# Patient Record
Sex: Male | Born: 1950 | Race: White | Hispanic: No | Marital: Married | Smoking: Current every day smoker
Health system: Southern US, Community
[De-identification: ages and names within clinical notes are randomized; demographics above are authoritative.]

## PROBLEM LIST (undated history)

## (undated) DIAGNOSIS — M545 Low back pain, unspecified: Secondary | ICD-10-CM

## (undated) DIAGNOSIS — J449 Chronic obstructive pulmonary disease, unspecified: Secondary | ICD-10-CM

## (undated) DIAGNOSIS — K449 Diaphragmatic hernia without obstruction or gangrene: Secondary | ICD-10-CM

## (undated) DIAGNOSIS — IMO0002 Reserved for concepts with insufficient information to code with codable children: Secondary | ICD-10-CM

## (undated) DIAGNOSIS — N529 Male erectile dysfunction, unspecified: Secondary | ICD-10-CM

## (undated) DIAGNOSIS — I10 Essential (primary) hypertension: Secondary | ICD-10-CM

## (undated) DIAGNOSIS — K219 Gastro-esophageal reflux disease without esophagitis: Secondary | ICD-10-CM

## (undated) DIAGNOSIS — G4733 Obstructive sleep apnea (adult) (pediatric): Secondary | ICD-10-CM

## (undated) DIAGNOSIS — K5790 Diverticulosis of intestine, part unspecified, without perforation or abscess without bleeding: Secondary | ICD-10-CM

## (undated) DIAGNOSIS — I739 Peripheral vascular disease, unspecified: Secondary | ICD-10-CM

## (undated) DIAGNOSIS — N281 Cyst of kidney, acquired: Secondary | ICD-10-CM

## (undated) DIAGNOSIS — G510 Bell's palsy: Secondary | ICD-10-CM

## (undated) DIAGNOSIS — E538 Deficiency of other specified B group vitamins: Secondary | ICD-10-CM

## (undated) DIAGNOSIS — R0602 Shortness of breath: Secondary | ICD-10-CM

## (undated) DIAGNOSIS — E7800 Pure hypercholesterolemia, unspecified: Secondary | ICD-10-CM

## (undated) DIAGNOSIS — K635 Polyp of colon: Secondary | ICD-10-CM

## (undated) DIAGNOSIS — C966 Unifocal Langerhans-cell histiocytosis: Secondary | ICD-10-CM

## (undated) DIAGNOSIS — K859 Acute pancreatitis without necrosis or infection, unspecified: Secondary | ICD-10-CM

## (undated) DIAGNOSIS — G5710 Meralgia paresthetica, unspecified lower limb: Secondary | ICD-10-CM

## (undated) DIAGNOSIS — I219 Acute myocardial infarction, unspecified: Secondary | ICD-10-CM

## (undated) DIAGNOSIS — I2510 Atherosclerotic heart disease of native coronary artery without angina pectoris: Secondary | ICD-10-CM

## (undated) HISTORY — DX: Reserved for concepts with insufficient information to code with codable children: IMO0002

## (undated) HISTORY — DX: Polyp of colon: K63.5

## (undated) HISTORY — PX: FEMORAL BYPASS: SHX50

## (undated) HISTORY — DX: Peripheral vascular disease, unspecified: I73.9

## (undated) HISTORY — DX: Deficiency of other specified B group vitamins: E53.8

## (undated) HISTORY — PX: BYPASS GRAFT: SHX909

## (undated) HISTORY — DX: Unifocal Langerhans-cell histiocytosis: C96.6

## (undated) HISTORY — DX: Chronic obstructive pulmonary disease, unspecified: J44.9

## (undated) HISTORY — PX: TUMOR REMOVAL: SHX12

## (undated) HISTORY — DX: Pure hypercholesterolemia, unspecified: E78.00

## (undated) HISTORY — DX: Diverticulosis of intestine, part unspecified, without perforation or abscess without bleeding: K57.90

## (undated) HISTORY — DX: Essential (primary) hypertension: I10

## (undated) HISTORY — DX: Meralgia paresthetica, unspecified lower limb: G57.10

## (undated) HISTORY — PX: COLONOSCOPY: SHX174

## (undated) HISTORY — DX: Gastro-esophageal reflux disease without esophagitis: K21.9

## (undated) HISTORY — DX: Low back pain, unspecified: M54.50

## (undated) HISTORY — DX: Cyst of kidney, acquired: N28.1

## (undated) HISTORY — DX: Diaphragmatic hernia without obstruction or gangrene: K44.9

## (undated) HISTORY — DX: Bell's palsy: G51.0

## (undated) HISTORY — DX: Low back pain: M54.5

## (undated) HISTORY — DX: Obstructive sleep apnea (adult) (pediatric): G47.33

## (undated) HISTORY — PX: UPPER GASTROINTESTINAL ENDOSCOPY: SHX188

## (undated) HISTORY — DX: Atherosclerotic heart disease of native coronary artery without angina pectoris: I25.10

## (undated) HISTORY — PX: INGUINAL HERNIA REPAIR: SUR1180

## (undated) HISTORY — DX: Acute pancreatitis without necrosis or infection, unspecified: K85.90

## (undated) HISTORY — DX: Acute myocardial infarction, unspecified: I21.9

## (undated) HISTORY — DX: Male erectile dysfunction, unspecified: N52.9

---

## 2004-03-09 ENCOUNTER — Emergency Department (HOSPITAL_COMMUNITY): Admission: EM | Admit: 2004-03-09 | Discharge: 2004-03-09 | Payer: Self-pay | Admitting: Emergency Medicine

## 2004-10-06 ENCOUNTER — Ambulatory Visit: Payer: Self-pay | Admitting: Internal Medicine

## 2004-10-08 ENCOUNTER — Ambulatory Visit: Payer: Self-pay

## 2004-10-11 ENCOUNTER — Ambulatory Visit: Payer: Self-pay

## 2004-11-08 ENCOUNTER — Ambulatory Visit: Payer: Self-pay | Admitting: Internal Medicine

## 2004-12-07 ENCOUNTER — Ambulatory Visit: Payer: Self-pay | Admitting: Internal Medicine

## 2005-02-04 ENCOUNTER — Ambulatory Visit: Payer: Self-pay | Admitting: Internal Medicine

## 2005-03-18 ENCOUNTER — Ambulatory Visit: Payer: Self-pay | Admitting: Gastroenterology

## 2005-03-21 ENCOUNTER — Ambulatory Visit (HOSPITAL_COMMUNITY): Admission: RE | Admit: 2005-03-21 | Discharge: 2005-03-21 | Payer: Self-pay | Admitting: Neurology

## 2005-04-01 ENCOUNTER — Encounter (INDEPENDENT_AMBULATORY_CARE_PROVIDER_SITE_OTHER): Payer: Self-pay | Admitting: *Deleted

## 2005-04-01 ENCOUNTER — Ambulatory Visit: Payer: Self-pay | Admitting: Gastroenterology

## 2005-04-01 DIAGNOSIS — K449 Diaphragmatic hernia without obstruction or gangrene: Secondary | ICD-10-CM | POA: Insufficient documentation

## 2005-04-01 DIAGNOSIS — K5730 Diverticulosis of large intestine without perforation or abscess without bleeding: Secondary | ICD-10-CM | POA: Insufficient documentation

## 2005-05-06 ENCOUNTER — Ambulatory Visit: Payer: Self-pay | Admitting: Internal Medicine

## 2005-05-06 ENCOUNTER — Encounter: Admission: RE | Admit: 2005-05-06 | Discharge: 2005-05-06 | Payer: Self-pay | Admitting: Neurology

## 2005-05-27 ENCOUNTER — Encounter: Admission: RE | Admit: 2005-05-27 | Discharge: 2005-05-27 | Payer: Self-pay | Admitting: Neurology

## 2005-06-24 ENCOUNTER — Ambulatory Visit: Payer: Self-pay | Admitting: Internal Medicine

## 2005-09-09 ENCOUNTER — Ambulatory Visit: Payer: Self-pay | Admitting: Internal Medicine

## 2005-11-18 ENCOUNTER — Ambulatory Visit: Payer: Self-pay | Admitting: Cardiology

## 2005-11-25 ENCOUNTER — Ambulatory Visit: Payer: Self-pay

## 2005-12-14 ENCOUNTER — Ambulatory Visit: Payer: Self-pay | Admitting: Internal Medicine

## 2006-03-08 ENCOUNTER — Ambulatory Visit: Payer: Self-pay | Admitting: Internal Medicine

## 2006-03-15 ENCOUNTER — Ambulatory Visit: Payer: Self-pay | Admitting: Internal Medicine

## 2006-06-15 ENCOUNTER — Ambulatory Visit: Payer: Self-pay | Admitting: Internal Medicine

## 2006-06-29 ENCOUNTER — Ambulatory Visit: Payer: Self-pay | Admitting: Internal Medicine

## 2006-07-17 ENCOUNTER — Ambulatory Visit: Payer: Self-pay | Admitting: Gastroenterology

## 2006-07-31 ENCOUNTER — Encounter (INDEPENDENT_AMBULATORY_CARE_PROVIDER_SITE_OTHER): Payer: Self-pay | Admitting: Specialist

## 2006-07-31 ENCOUNTER — Ambulatory Visit: Payer: Self-pay | Admitting: Gastroenterology

## 2006-07-31 DIAGNOSIS — K2980 Duodenitis without bleeding: Secondary | ICD-10-CM | POA: Insufficient documentation

## 2006-09-28 ENCOUNTER — Ambulatory Visit: Payer: Self-pay | Admitting: Internal Medicine

## 2006-10-03 ENCOUNTER — Ambulatory Visit: Payer: Self-pay | Admitting: Gastroenterology

## 2006-12-28 ENCOUNTER — Ambulatory Visit: Payer: Self-pay | Admitting: Internal Medicine

## 2007-06-28 ENCOUNTER — Ambulatory Visit: Payer: Self-pay | Admitting: Internal Medicine

## 2007-06-28 LAB — CONVERTED CEMR LAB
ALT: 22 units/L (ref 0–53)
AST: 21 units/L (ref 0–37)
Albumin: 4 g/dL (ref 3.5–5.2)
Alkaline Phosphatase: 80 units/L (ref 39–117)
BUN: 19 mg/dL (ref 6–23)
Bacteria, UA: NEGATIVE
Basophils Relative: 0.7 % (ref 0.0–1.0)
Bilirubin Urine: NEGATIVE
Chloride: 105 meq/L (ref 96–112)
Cholesterol: 205 mg/dL (ref 0–200)
Eosinophils Absolute: 0.1 10*3/uL (ref 0.0–0.6)
GFR calc Af Amer: 113 mL/min
HCT: 42 % (ref 39.0–52.0)
HDL: 21.4 mg/dL — ABNORMAL LOW (ref >39.0)
Hemoglobin: 15 g/dL (ref 13.0–17.0)
Leukocytes, UA: NEGATIVE
MCV: 89.9 fL (ref 78.0–100.0)
Monocytes Absolute: 0.7 10*3/uL (ref 0.2–0.7)
Monocytes Relative: 8.1 % (ref 3.0–11.0)
Neutro Abs: 4.7 10*3/uL (ref 1.4–7.7)
Neutrophils Relative %: 55.8 % (ref 43.0–77.0)
Nitrite: NEGATIVE
PSA: 0.34 ng/mL (ref 0.10–4.00)
Potassium: 4.4 meq/L (ref 3.5–5.1)
Specific Gravity, Urine: 1.02 (ref 1.000–1.03)
Total Bilirubin: 0.5 mg/dL (ref 0.3–1.2)
Total CHOL/HDL Ratio: 9.6
Triglycerides: 230 mg/dL (ref 0–149)
Urine Glucose: NEGATIVE mg/dL
Urobilinogen, UA: 0.2 (ref 0.0–1.0)
VLDL: 46 mg/dL — ABNORMAL HIGH (ref 0–40)
WBC: 8.4 10*3/uL (ref 4.5–10.5)
pH: 6 (ref 5.0–8.0)

## 2007-08-06 ENCOUNTER — Ambulatory Visit: Payer: Self-pay | Admitting: Internal Medicine

## 2007-08-06 LAB — CONVERTED CEMR LAB
ALT: 30 units/L (ref 0–53)
AST: 20 units/L (ref 0–37)
Amylase: 77 units/L (ref 27–131)
Lipase: 31 units/L (ref 11.0–59.0)
PSA: 0.37 ng/mL (ref 0.10–4.00)

## 2007-08-07 ENCOUNTER — Ambulatory Visit: Payer: Self-pay

## 2007-08-22 ENCOUNTER — Encounter: Payer: Self-pay | Admitting: Internal Medicine

## 2007-09-17 ENCOUNTER — Encounter (INDEPENDENT_AMBULATORY_CARE_PROVIDER_SITE_OTHER): Payer: Self-pay | Admitting: *Deleted

## 2007-09-17 ENCOUNTER — Ambulatory Visit: Payer: Self-pay | Admitting: Internal Medicine

## 2007-09-17 DIAGNOSIS — Z8719 Personal history of other diseases of the digestive system: Secondary | ICD-10-CM | POA: Insufficient documentation

## 2007-09-17 DIAGNOSIS — J449 Chronic obstructive pulmonary disease, unspecified: Secondary | ICD-10-CM | POA: Insufficient documentation

## 2007-09-17 DIAGNOSIS — R079 Chest pain, unspecified: Secondary | ICD-10-CM | POA: Insufficient documentation

## 2007-09-17 DIAGNOSIS — E0590 Thyrotoxicosis, unspecified without thyrotoxic crisis or storm: Secondary | ICD-10-CM | POA: Insufficient documentation

## 2007-09-17 DIAGNOSIS — J209 Acute bronchitis, unspecified: Secondary | ICD-10-CM | POA: Insufficient documentation

## 2007-09-17 DIAGNOSIS — Z860100 Personal history of colon polyps, unspecified: Secondary | ICD-10-CM | POA: Insufficient documentation

## 2007-09-17 DIAGNOSIS — C649 Malignant neoplasm of unspecified kidney, except renal pelvis: Secondary | ICD-10-CM | POA: Insufficient documentation

## 2007-09-17 DIAGNOSIS — K219 Gastro-esophageal reflux disease without esophagitis: Secondary | ICD-10-CM | POA: Insufficient documentation

## 2007-09-17 DIAGNOSIS — L723 Sebaceous cyst: Secondary | ICD-10-CM | POA: Insufficient documentation

## 2007-09-17 DIAGNOSIS — I10 Essential (primary) hypertension: Secondary | ICD-10-CM | POA: Insufficient documentation

## 2007-09-17 DIAGNOSIS — M545 Low back pain, unspecified: Secondary | ICD-10-CM | POA: Insufficient documentation

## 2007-09-17 DIAGNOSIS — I739 Peripheral vascular disease, unspecified: Secondary | ICD-10-CM | POA: Insufficient documentation

## 2007-09-17 DIAGNOSIS — J4489 Other specified chronic obstructive pulmonary disease: Secondary | ICD-10-CM | POA: Insufficient documentation

## 2007-09-17 DIAGNOSIS — F411 Generalized anxiety disorder: Secondary | ICD-10-CM | POA: Insufficient documentation

## 2007-10-01 ENCOUNTER — Encounter: Payer: Self-pay | Admitting: Internal Medicine

## 2007-10-10 ENCOUNTER — Ambulatory Visit: Payer: Self-pay | Admitting: Gastroenterology

## 2007-10-30 DIAGNOSIS — N281 Cyst of kidney, acquired: Secondary | ICD-10-CM | POA: Insufficient documentation

## 2007-10-30 DIAGNOSIS — E7800 Pure hypercholesterolemia, unspecified: Secondary | ICD-10-CM | POA: Insufficient documentation

## 2007-10-30 DIAGNOSIS — G510 Bell's palsy: Secondary | ICD-10-CM | POA: Insufficient documentation

## 2007-12-12 ENCOUNTER — Ambulatory Visit: Payer: Self-pay | Admitting: Internal Medicine

## 2007-12-13 LAB — CONVERTED CEMR LAB
CO2: 31 meq/L (ref 19–32)
Calcium: 9.2 mg/dL (ref 8.4–10.5)
Creatinine, Ser: 0.9 mg/dL (ref 0.4–1.5)
GFR calc Af Amer: 113 mL/min
Glucose, Bld: 106 mg/dL — ABNORMAL HIGH (ref 70–99)
Potassium: 4.1 meq/L (ref 3.5–5.1)
Sodium: 138 meq/L (ref 135–145)

## 2007-12-19 ENCOUNTER — Ambulatory Visit: Payer: Self-pay | Admitting: Internal Medicine

## 2007-12-19 DIAGNOSIS — F17200 Nicotine dependence, unspecified, uncomplicated: Secondary | ICD-10-CM | POA: Insufficient documentation

## 2008-01-28 ENCOUNTER — Telehealth: Payer: Self-pay | Admitting: Internal Medicine

## 2008-02-04 ENCOUNTER — Ambulatory Visit: Payer: Self-pay | Admitting: Internal Medicine

## 2008-02-05 LAB — CONVERTED CEMR LAB
Basophils Absolute: 0 10*3/uL (ref 0.0–0.1)
Basophils Relative: 0.5 % (ref 0.0–1.0)
CO2: 28 meq/L (ref 19–32)
Chloride: 99 meq/L (ref 96–112)
Glucose, Bld: 102 mg/dL — ABNORMAL HIGH (ref 70–99)
MCHC: 35.5 g/dL (ref 30.0–36.0)
MCV: 90.3 fL (ref 78.0–100.0)
Neutro Abs: 5.1 10*3/uL (ref 1.4–7.7)
Neutrophils Relative %: 52 % (ref 43.0–77.0)
Platelets: 272 10*3/uL (ref 150–400)
Potassium: 3.8 meq/L (ref 3.5–5.1)
RBC: 5.02 M/uL (ref 4.22–5.81)
RDW: 13.1 % (ref 11.5–14.6)
Sodium: 134 meq/L — ABNORMAL LOW (ref 135–145)

## 2008-02-11 ENCOUNTER — Ambulatory Visit: Payer: Self-pay | Admitting: Internal Medicine

## 2008-02-11 ENCOUNTER — Encounter: Payer: Self-pay | Admitting: Internal Medicine

## 2008-02-11 DIAGNOSIS — R930 Abnormal findings on diagnostic imaging of skull and head, not elsewhere classified: Secondary | ICD-10-CM | POA: Insufficient documentation

## 2008-02-11 DIAGNOSIS — E041 Nontoxic single thyroid nodule: Secondary | ICD-10-CM | POA: Insufficient documentation

## 2008-02-14 ENCOUNTER — Encounter (INDEPENDENT_AMBULATORY_CARE_PROVIDER_SITE_OTHER): Payer: Self-pay | Admitting: *Deleted

## 2008-02-18 ENCOUNTER — Encounter: Payer: Self-pay | Admitting: Internal Medicine

## 2008-02-28 ENCOUNTER — Ambulatory Visit: Payer: Self-pay | Admitting: Gastroenterology

## 2008-03-03 ENCOUNTER — Ambulatory Visit: Payer: Self-pay | Admitting: Endocrinology

## 2008-03-03 DIAGNOSIS — J8482 Adult pulmonary Langerhans cell histiocytosis: Secondary | ICD-10-CM | POA: Insufficient documentation

## 2008-03-10 ENCOUNTER — Ambulatory Visit: Payer: Self-pay | Admitting: Internal Medicine

## 2008-03-13 ENCOUNTER — Encounter: Admission: RE | Admit: 2008-03-13 | Discharge: 2008-03-13 | Payer: Self-pay | Admitting: Endocrinology

## 2008-04-17 ENCOUNTER — Ambulatory Visit: Payer: Self-pay | Admitting: Internal Medicine

## 2008-04-19 LAB — CONVERTED CEMR LAB
Bilirubin, Direct: 0.1 mg/dL (ref 0.0–0.3)
Chloride: 100 meq/L (ref 96–112)
Cholesterol: 163 mg/dL (ref 0–200)
Creatinine, Ser: 1 mg/dL (ref 0.4–1.5)
LDL Cholesterol: 100 mg/dL — ABNORMAL HIGH (ref 0–99)
Total Bilirubin: 0.7 mg/dL (ref 0.3–1.2)
Total CHOL/HDL Ratio: 6.9
Total Protein: 6.9 g/dL (ref 6.0–8.3)
VLDL: 40 mg/dL (ref 0–40)
Vitamin B-12: 228 pg/mL (ref 211–911)

## 2008-04-24 ENCOUNTER — Ambulatory Visit: Payer: Self-pay | Admitting: Internal Medicine

## 2008-04-24 DIAGNOSIS — E538 Deficiency of other specified B group vitamins: Secondary | ICD-10-CM | POA: Insufficient documentation

## 2008-05-05 ENCOUNTER — Ambulatory Visit: Payer: Self-pay | Admitting: Internal Medicine

## 2008-05-06 ENCOUNTER — Ambulatory Visit: Payer: Self-pay | Admitting: Cardiology

## 2008-07-22 ENCOUNTER — Ambulatory Visit: Payer: Self-pay | Admitting: Internal Medicine

## 2008-07-22 LAB — CONVERTED CEMR LAB
ALT: 23 units/L (ref 0–53)
AST: 22 units/L (ref 0–37)

## 2008-07-25 ENCOUNTER — Ambulatory Visit: Payer: Self-pay | Admitting: Internal Medicine

## 2008-08-15 ENCOUNTER — Ambulatory Visit: Payer: Self-pay

## 2008-08-20 ENCOUNTER — Ambulatory Visit: Payer: Self-pay | Admitting: Internal Medicine

## 2008-08-20 LAB — CONVERTED CEMR LAB: Creatinine, Ser: 1 mg/dL (ref 0.4–1.5)

## 2008-09-12 ENCOUNTER — Ambulatory Visit: Payer: Self-pay | Admitting: Internal Medicine

## 2008-09-12 DIAGNOSIS — R109 Unspecified abdominal pain: Secondary | ICD-10-CM | POA: Insufficient documentation

## 2008-09-15 ENCOUNTER — Ambulatory Visit: Payer: Self-pay | Admitting: Internal Medicine

## 2008-11-24 ENCOUNTER — Ambulatory Visit: Payer: Self-pay | Admitting: Internal Medicine

## 2008-11-24 LAB — CONVERTED CEMR LAB
Alkaline Phosphatase: 73 units/L (ref 39–117)
BUN: 20 mg/dL (ref 6–23)
Bilirubin, Direct: 0.1 mg/dL (ref 0.0–0.3)
Calcium: 9.4 mg/dL (ref 8.4–10.5)
Chloride: 104 meq/L (ref 96–112)
Cholesterol: 210 mg/dL (ref 0–200)
Creatinine, Ser: 0.9 mg/dL (ref 0.4–1.5)
Sodium: 138 meq/L (ref 135–145)
Total Bilirubin: 0.9 mg/dL (ref 0.3–1.2)
Total Protein: 6.8 g/dL (ref 6.0–8.3)
VLDL: 60 mg/dL — ABNORMAL HIGH (ref 0–40)

## 2008-11-27 ENCOUNTER — Ambulatory Visit: Payer: Self-pay | Admitting: Internal Medicine

## 2008-12-05 ENCOUNTER — Encounter: Payer: Self-pay | Admitting: Internal Medicine

## 2008-12-11 ENCOUNTER — Encounter: Payer: Self-pay | Admitting: Internal Medicine

## 2009-03-23 ENCOUNTER — Ambulatory Visit: Payer: Self-pay | Admitting: Internal Medicine

## 2009-03-23 LAB — CONVERTED CEMR LAB
AST: 21 units/L (ref 0–37)
BUN: 16 mg/dL (ref 6–23)
Bilirubin, Direct: 0.2 mg/dL (ref 0.0–0.3)
Calcium: 9.1 mg/dL (ref 8.4–10.5)
Creatinine, Ser: 0.9 mg/dL (ref 0.4–1.5)
Direct LDL: 147.1 mg/dL
Glucose, Bld: 98 mg/dL (ref 70–99)
Total Bilirubin: 0.8 mg/dL (ref 0.3–1.2)
Total CHOL/HDL Ratio: 8
VLDL: 45.6 mg/dL — ABNORMAL HIGH (ref 0.0–40.0)

## 2009-03-26 ENCOUNTER — Ambulatory Visit: Payer: Self-pay | Admitting: Internal Medicine

## 2009-03-26 DIAGNOSIS — R198 Other specified symptoms and signs involving the digestive system and abdomen: Secondary | ICD-10-CM | POA: Insufficient documentation

## 2009-04-28 ENCOUNTER — Ambulatory Visit: Payer: Self-pay | Admitting: Gastroenterology

## 2009-05-19 ENCOUNTER — Ambulatory Visit: Payer: Self-pay | Admitting: Gastroenterology

## 2009-05-20 ENCOUNTER — Telehealth: Payer: Self-pay | Admitting: Gastroenterology

## 2009-05-29 ENCOUNTER — Telehealth: Payer: Self-pay | Admitting: Internal Medicine

## 2009-06-02 ENCOUNTER — Ambulatory Visit: Payer: Self-pay | Admitting: Gastroenterology

## 2009-06-11 ENCOUNTER — Encounter (INDEPENDENT_AMBULATORY_CARE_PROVIDER_SITE_OTHER): Payer: Self-pay | Admitting: *Deleted

## 2009-06-11 ENCOUNTER — Ambulatory Visit: Payer: Self-pay | Admitting: Gastroenterology

## 2009-06-12 ENCOUNTER — Encounter: Payer: Self-pay | Admitting: Gastroenterology

## 2009-07-13 ENCOUNTER — Ambulatory Visit: Payer: Self-pay | Admitting: Gastroenterology

## 2009-07-13 LAB — CONVERTED CEMR LAB
OCCULT 3: NEGATIVE
OCCULT 4: NEGATIVE

## 2009-07-14 ENCOUNTER — Encounter: Payer: Self-pay | Admitting: Gastroenterology

## 2009-07-22 ENCOUNTER — Ambulatory Visit: Payer: Self-pay | Admitting: Internal Medicine

## 2009-07-23 LAB — CONVERTED CEMR LAB
ALT: 22 units/L (ref 0–53)
AST: 21 units/L (ref 0–37)
Alkaline Phosphatase: 71 units/L (ref 39–117)
BUN: 21 mg/dL (ref 6–23)
Bilirubin Urine: NEGATIVE
Calcium: 9.6 mg/dL (ref 8.4–10.5)
Creatinine, Ser: 1.1 mg/dL (ref 0.4–1.5)
Direct LDL: 125.3 mg/dL
Glucose, Bld: 107 mg/dL — ABNORMAL HIGH (ref 70–99)
Hgb A1c MFr Bld: 5.7 % (ref 4.6–6.5)
Leukocytes, UA: NEGATIVE
Nitrite: NEGATIVE
Potassium: 4.9 meq/L (ref 3.5–5.1)
Specific Gravity, Urine: 1.025 (ref 1.000–1.030)
TSH: 1.14 microintl units/mL (ref 0.35–5.50)
Total CHOL/HDL Ratio: 9
Triglycerides: 287 mg/dL — ABNORMAL HIGH (ref 0.0–149.0)
VLDL: 57.4 mg/dL — ABNORMAL HIGH (ref 0.0–40.0)

## 2009-07-29 ENCOUNTER — Ambulatory Visit: Payer: Self-pay | Admitting: Internal Medicine

## 2009-07-29 DIAGNOSIS — M6740 Ganglion, unspecified site: Secondary | ICD-10-CM | POA: Insufficient documentation

## 2009-08-05 ENCOUNTER — Encounter: Payer: Self-pay | Admitting: Internal Medicine

## 2009-08-07 ENCOUNTER — Encounter: Admission: RE | Admit: 2009-08-07 | Discharge: 2009-08-07 | Payer: Self-pay | Admitting: Internal Medicine

## 2009-08-11 ENCOUNTER — Ambulatory Visit: Payer: Self-pay

## 2009-08-11 ENCOUNTER — Encounter: Payer: Self-pay | Admitting: Internal Medicine

## 2009-08-12 ENCOUNTER — Encounter: Payer: Self-pay | Admitting: Internal Medicine

## 2009-08-12 DIAGNOSIS — I6529 Occlusion and stenosis of unspecified carotid artery: Secondary | ICD-10-CM | POA: Insufficient documentation

## 2009-08-28 ENCOUNTER — Ambulatory Visit: Payer: Self-pay | Admitting: Internal Medicine

## 2009-08-28 DIAGNOSIS — I2510 Atherosclerotic heart disease of native coronary artery without angina pectoris: Secondary | ICD-10-CM | POA: Insufficient documentation

## 2009-09-02 ENCOUNTER — Encounter: Payer: Self-pay | Admitting: Internal Medicine

## 2009-11-23 ENCOUNTER — Ambulatory Visit: Payer: Self-pay | Admitting: Internal Medicine

## 2009-11-24 LAB — CONVERTED CEMR LAB
AST: 19 units/L (ref 0–37)
Albumin: 4.2 g/dL (ref 3.5–5.2)
Alkaline Phosphatase: 77 units/L (ref 39–117)
CO2: 29 meq/L (ref 19–32)
Calcium: 9.6 mg/dL (ref 8.4–10.5)
GFR calc non Af Amer: 81.71 mL/min (ref >60)
Potassium: 4.2 meq/L (ref 3.5–5.1)
Total Bilirubin: 1 mg/dL (ref 0.3–1.2)
Total Protein: 7.1 g/dL (ref 6.0–8.3)
Vitamin B-12: 252 pg/mL (ref 211–911)

## 2009-11-26 ENCOUNTER — Ambulatory Visit: Payer: Self-pay | Admitting: Internal Medicine

## 2010-01-18 ENCOUNTER — Ambulatory Visit: Payer: Self-pay | Admitting: Internal Medicine

## 2010-05-25 ENCOUNTER — Ambulatory Visit: Payer: Self-pay | Admitting: Internal Medicine

## 2010-05-25 LAB — CONVERTED CEMR LAB
ALT: 29 units/L (ref 0–53)
Albumin: 4 g/dL (ref 3.5–5.2)
Alkaline Phosphatase: 82 units/L (ref 39–117)
Basophils Relative: 1 % (ref 0.0–3.0)
Bilirubin, Direct: 0.1 mg/dL (ref 0.0–0.3)
CO2: 29 meq/L (ref 19–32)
Calcium: 9.5 mg/dL (ref 8.4–10.5)
Direct LDL: 135.9 mg/dL
Eosinophils Absolute: 0.2 10*3/uL (ref 0.0–0.7)
Eosinophils Relative: 2 % (ref 0.0–5.0)
Glucose, Bld: 92 mg/dL (ref 70–99)
Lymphocytes Relative: 29 % (ref 12.0–46.0)
Monocytes Absolute: 0.9 10*3/uL (ref 0.1–1.0)
Monocytes Relative: 9.2 % (ref 3.0–12.0)
Neutro Abs: 5.6 10*3/uL (ref 1.4–7.7)
Total Bilirubin: 0.7 mg/dL (ref 0.3–1.2)
Total Protein: 7.1 g/dL (ref 6.0–8.3)
VLDL: 48 mg/dL — ABNORMAL HIGH (ref 0.0–40.0)
WBC: 9.6 10*3/uL (ref 4.5–10.5)

## 2010-06-01 ENCOUNTER — Ambulatory Visit: Payer: Self-pay | Admitting: Internal Medicine

## 2010-06-01 DIAGNOSIS — R209 Unspecified disturbances of skin sensation: Secondary | ICD-10-CM | POA: Insufficient documentation

## 2010-06-01 DIAGNOSIS — N529 Male erectile dysfunction, unspecified: Secondary | ICD-10-CM | POA: Insufficient documentation

## 2010-09-16 ENCOUNTER — Encounter: Payer: Self-pay | Admitting: Internal Medicine

## 2010-09-17 ENCOUNTER — Ambulatory Visit: Payer: Self-pay | Admitting: Internal Medicine

## 2010-09-17 ENCOUNTER — Ambulatory Visit: Payer: Self-pay

## 2010-10-06 ENCOUNTER — Ambulatory Visit: Payer: Self-pay | Admitting: Internal Medicine

## 2010-10-13 ENCOUNTER — Ambulatory Visit: Payer: Self-pay | Admitting: Internal Medicine

## 2010-11-02 ENCOUNTER — Telehealth: Payer: Self-pay | Admitting: Internal Medicine

## 2010-11-03 ENCOUNTER — Other Ambulatory Visit: Payer: Self-pay | Admitting: Internal Medicine

## 2010-11-03 LAB — URINALYSIS
Bilirubin Urine: NEGATIVE
Hemoglobin, Urine: NEGATIVE
Ketones, ur: NEGATIVE
Leukocytes, UA: NEGATIVE
Nitrite: NEGATIVE
Specific Gravity, Urine: 1.015 (ref 1.000–1.030)
Total Protein, Urine: NEGATIVE
Urine Glucose: NEGATIVE
Urobilinogen, UA: 0.2 (ref 0.0–1.0)
pH: 5.5 (ref 5.0–8.0)

## 2010-11-03 LAB — BASIC METABOLIC PANEL
BUN: 17 mg/dL (ref 6–23)
CO2: 27 mEq/L (ref 19–32)
Calcium: 9.5 mg/dL (ref 8.4–10.5)
Chloride: 103 mEq/L (ref 96–112)
Creatinine, Ser: 0.9 mg/dL (ref 0.4–1.5)
GFR: 87.47 mL/min (ref >60.00)
Glucose, Bld: 83 mg/dL (ref 70–99)
Potassium: 4.1 mEq/L (ref 3.5–5.1)
Sodium: 136 mEq/L (ref 135–145)

## 2010-11-03 LAB — CBC WITH DIFFERENTIAL/PLATELET
Basophils Absolute: 0.1 10*3/uL (ref 0.0–0.1)
Basophils Relative: 0.6 % (ref 0.0–3.0)
Eosinophils Absolute: 0.2 10*3/uL (ref 0.0–0.7)
Eosinophils Relative: 2.1 % (ref 0.0–5.0)
HCT: 50.6 % (ref 39.0–52.0)
Hemoglobin: 17.6 g/dL — ABNORMAL HIGH (ref 13.0–17.0)
Lymphocytes Relative: 29.8 % (ref 12.0–46.0)
Lymphs Abs: 2.9 10*3/uL (ref 0.7–4.0)
MCHC: 34.8 g/dL (ref 30.0–36.0)
MCV: 92.2 fl (ref 78.0–100.0)
Monocytes Absolute: 1 10*3/uL (ref 0.1–1.0)
Monocytes Relative: 10.4 % (ref 3.0–12.0)
Neutro Abs: 5.6 10*3/uL (ref 1.4–7.7)
Neutrophils Relative %: 57.1 % (ref 43.0–77.0)
Platelets: 238 10*3/uL (ref 150.0–400.0)
RBC: 5.49 Mil/uL (ref 4.22–5.81)
RDW: 13.7 % (ref 11.5–14.6)
WBC: 9.9 10*3/uL (ref 4.5–10.5)

## 2010-11-03 LAB — TSH: TSH: 1.82 u[IU]/mL (ref 0.35–5.50)

## 2010-11-03 LAB — HEPATIC FUNCTION PANEL
ALT: 21 U/L (ref 0–53)
AST: 22 U/L (ref 0–37)
Albumin: 3.7 g/dL (ref 3.5–5.2)
Alkaline Phosphatase: 78 U/L (ref 39–117)
Bilirubin, Direct: 0.1 mg/dL (ref 0.0–0.3)
Total Bilirubin: 0.9 mg/dL (ref 0.3–1.2)
Total Protein: 7.1 g/dL (ref 6.0–8.3)

## 2010-11-03 LAB — LIPID PANEL
Cholesterol: 207 mg/dL — ABNORMAL HIGH (ref 0–200)
HDL: 21.8 mg/dL — ABNORMAL LOW (ref >39.00)
Total CHOL/HDL Ratio: 9
Triglycerides: 311 mg/dL — ABNORMAL HIGH (ref 0.0–149.0)
VLDL: 62.2 mg/dL — ABNORMAL HIGH (ref 0.0–40.0)

## 2010-11-03 LAB — PSA: PSA: 0.48 ng/mL (ref 0.10–4.00)

## 2010-11-03 LAB — LDL CHOLESTEROL, DIRECT: Direct LDL: 135.3 mg/dL

## 2010-11-05 LAB — TESTOSTERONE: Testosterone: 320.2 ng/dL — ABNORMAL LOW (ref 350.00–890.00)

## 2010-11-10 DIAGNOSIS — D751 Secondary polycythemia: Secondary | ICD-10-CM | POA: Insufficient documentation

## 2010-11-10 DIAGNOSIS — R5381 Other malaise: Secondary | ICD-10-CM | POA: Insufficient documentation

## 2010-11-10 DIAGNOSIS — K602 Anal fissure, unspecified: Secondary | ICD-10-CM | POA: Insufficient documentation

## 2010-11-10 DIAGNOSIS — E291 Testicular hypofunction: Secondary | ICD-10-CM | POA: Insufficient documentation

## 2010-11-10 DIAGNOSIS — R5383 Other fatigue: Secondary | ICD-10-CM

## 2010-11-10 DIAGNOSIS — G4733 Obstructive sleep apnea (adult) (pediatric): Secondary | ICD-10-CM | POA: Insufficient documentation

## 2010-11-21 ENCOUNTER — Encounter: Payer: Self-pay | Admitting: Internal Medicine

## 2010-11-21 ENCOUNTER — Encounter: Payer: Self-pay | Admitting: Neurology

## 2010-11-30 NOTE — Assessment & Plan Note (Signed)
Summary: 4 MO ROV /NWS  #   Vital Signs:  Patient profile:   60 year old male Height:      70 inches Weight:      253 pounds BMI:     36.43 O2 Sat:      94 % on Room air Temp:     99.0 degrees F oral Pulse rate:   95 / minute Pulse rhythm:   regular Resp:     16 per minute BP sitting:   128 / 80  (left arm) Cuff size:   large  Vitals Entered By: Lanier Prude, CMA(AAMA) (June 01, 2010 8:07 AM)  O2 Flow:  Room air CC: 4 mo f/u  c/o rt leg numbness Is Patient Diabetic? No   Primary Care Provider:  Sonda Primes, MD  CC:  4 mo f/u  c/o rt leg numbness.  History of Present Illness: The patient presents for a follow up of hypertension, diabetes, hyperlipidemia, COPD C/o R thigh pain and numbness - it hurts a lot C/o ED - Levitra is not working   Current Medications (verified): 1)  Benazepril-Hydrochlorothiazide 20-25 Mg  Tabs (Benazepril-Hydrochlorothiazide) .Marland Kitchen.. 1 Qam 2)  Aspir-Low 81 Mg Tbec (Aspirin) .Marland Kitchen.. 1 Once Daily Pc 3)  Meloxicam 15 Mg Tabs (Meloxicam) .... One By Mouth Daily Pc As Needed Pain 4)  Vitamin D3 1000 Unit  Tabs (Cholecalciferol) .Marland Kitchen.. 1 Qd 5)  Vitamin B-12 Cr 1000 Mcg  Tbcr (Cyanocobalamin) .... Take One Tablet By Mouth Daily 6)  Prevacid 30 Mg Cpdr (Lansoprazole) .... Once A Day  Otc 7)  Triamcinolone 0.5% Cream in Eucerin Lotion 1:10 .... Use Two Times A Day As Needed Dry Skin 8)  Levitra 20 Mg Tabs (Vardenafil Hcl) .... 1/2-1 Once Daily As Needed 9)  Tramadol Hcl 50 Mg Tabs (Tramadol Hcl) .Marland Kitchen.. 1-2 Tabs By Mouth Two Times A Day As Needed Pain  Allergies (verified): 1)  Zocor 2)  Crestor 3)  Pravastatin Sodium (Pravastatin Sodium)  Past History:  Past Surgical History: Last updated: 09/12/2008 Rt. iliofemoral bypass  Rt. 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, chartered Divorced Former Smoker 2010 restarted 2011 Alcohol Use - yes Daily Caffeine Use 3 or4 cups daily  Past  Medical History: Current Problems:  HIATAL HERNIA (ICD-553.3) DUODENITIS, WITHOUT HEMORRHAGE (ICD-535.60) RENAL CYST (ICD-593.2) BELLS PALSY (ICD-351.0) HYPERCHOLESTEROLEMIA (ICD-272.0) DIVERTICULOSIS, COLON (ICD-562.10) SEBACCEOUS CYST-INFECTED (ICD-706.2) CARCINOMA, RENAL CELL (ICD-189.0) BRONCHITIS, ACUTE (ICD-466.0) LOW BACK PAIN (ICD-724.2) HYPERTENSION (ICD-401.9) HYPERTHYROIDISM (ICD-242.90) Vit B12 def 2009 PERIPHERAL VASCULAR DISEASE (ICD-443.9) PANCREATITIS, HX OF (ICD-V12.70) GERD (ICD-530.81) COPD (ICD-496) COLONIC POLYPS, HX OF (ICD-V12.72) ANXIETY (ICD-300.00) CHEST PAIN, UNSPECIFIED (ICD-786.50) ? 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  The patient denies fever, chest pain, prolonged cough, abdominal pain, melena, and weight gain.    Physical Exam  General:  overweight-appearing.   Eyes:  No corneal or conjunctival inflammation noted. EOMI. Perrla. Ears:  External ear exam shows no significant lesions or deformities.  Otoscopic examination reveals clear canals, tympanic membranes are intact bilaterally without bulging, retraction, inflammation or discharge. Hearing is grossly normal bilaterally. Nose:  External nasal examination shows no deformity or inflammation. Nasal mucosa are pink and moist without lesions  or exudates. Mouth:  Oral mucosa and oropharynx without lesions or exudates.  Teeth in good repair. Neck:  No deformities, masses, or tenderness noted. Lungs:  Normal respiratory effort, chest expands symmetrically. Lungs are clear to auscultation, no crackles or wheezes. Heart:  Normal rate and regular rhythm. S1 and S2 normal without gallop, click, rub or other extra sounds. 1/6 systolic heart murmur    Abdomen:  Bowel sounds positive,abdomen soft and non-tender without masses, organomegaly or hernias noted. Msk:  Lumbar-sacral spine is tender to palpation over paraspinal muscles and painfull with the ROM  Pulses:  R and L carotid,radial,femoral,dorsalis pedis and posterior tibial pulses are full and equal bilaterally Extremities:  B no edema Neurologic:  numb oval large area on R thigh Skin:  Intact without suspicious lesions or rashes Psych:  Cognition and judgment appear intact. Alert and cooperative with normal attention span and concentration. No apparent delusions, illusions, hallucinations   Impression & Recommendations:  Problem # 1:  PARESTHESIA (ICD-782.0) R thigh M paresthetica Assessment New Loosen up the belt  Problem # 2:  ERECTILE DYSFUNCTION, ORGANIC (ICD-607.84) Assessment: Deteriorated  His updated medication list for this problem includes:    Levitra 20 Mg Tabs (Vardenafil hcl) .Marland Kitchen... 1/2-1 once daily as needed    Cialis 20 Mg Tabs (Tadalafil) .Marland Kitchen... 1/2 or 1 by mouth q 1-3 d prn  Problem # 3:  B12 DEFICIENCY (ICD-266.2) Assessment: Unchanged On the regimen of medicine(s) reflected in the chart    Problem # 4:  LOW BACK PAIN (ICD-724.2) Assessment: Unchanged  His updated medication list for this problem includes:    Aspir-low 81 Mg Tbec (Aspirin) .Marland Kitchen... 1 once daily pc    Meloxicam 15 Mg Tabs (Meloxicam) ..... One by mouth daily pc as needed pain    Tramadol Hcl 50 Mg Tabs (Tramadol hcl) .Marland Kitchen... 1-2 tabs by mouth two times a day as needed pain  Problem # 5:  PERIPHERAL VASCULAR DISEASE (ICD-443.9) Assessment: Unchanged  Problem # 6:  TOBACCO USE DISORDER/SMOKER-SMOKING CESSATION DISCUSSED (ICD-305.1) Assessment: Deteriorated He has restarted smoking Encouraged smoking cessation and discussed different methods for smoking cessation.   Complete Medication List: 1)  Aspir-low 81 Mg Tbec (Aspirin) .Marland Kitchen.. 1 once daily pc 2)  Meloxicam 15 Mg Tabs (Meloxicam) ....  One by mouth daily pc as needed pain 3)  Vitamin D3 1000 Unit Tabs (Cholecalciferol) .Marland Kitchen.. 1 qd 4)  Vitamin B-12 Cr 1000 Mcg Tbcr (Cyanocobalamin) .... Take one tablet by mouth daily 5)  Prevacid 30 Mg Cpdr (Lansoprazole) .... Once a day  otc 6)  Triamcinolone 0.5% Cream in Eucerin Lotion 1:10  .... Use two times a day as needed dry skin 7)  Levitra 20 Mg Tabs (Vardenafil hcl) .... 1/2-1 once daily as needed 8)  Tramadol Hcl 50 Mg Tabs (Tramadol hcl) .Marland Kitchen.. 1-2 tabs by mouth two times a day as needed pain 9)  Losartan Potassium 100 Mg Tabs (Losartan potassium) .Marland Kitchen.. 1 by mouth once daily for blood pressure 10)  Cialis 20 Mg Tabs (Tadalafil) .... 1/2 or 1 by mouth q 1-3 d prn  Patient Instructions: 1)  Please schedule a follow-up appointment in 3 months. 2)  BMP prior to visit, ICD-9: 3)  Hepatic Panel prior to visit, ICD-9:995.20 272.0 4)  Lipid Panel prior to visit, ICD-9: 5)  TSH prior to visit, ICD-9: Prescriptions: CIALIS 20 MG TABS (TADALAFIL) 1/2 or 1 by mouth q 1-3 d prn  #6 x 12   Entered and Authorized by:   Macarthur Critchley  Buckner Malta MD   Signed by:   Tresa Garter MD on 06/01/2010   Method used:   Print then Give to Patient   RxID:   [id number] JYNWGNFA POTASSIUM 100 MG TABS (LOSARTAN POTASSIUM) 1 by mouth once daily for blood pressure  #30 x 12   Entered and Authorized by:   Tresa Garter MD   Signed by:   Tresa Garter MD on 06/01/2010   Method used:   Electronically to        [facility] [phone]* (retail)       [address]       [place], [place]  [address]       Ph: [phone]       Fax: [phone]   RxID:   [id number]

## 2010-11-30 NOTE — Assessment & Plan Note (Signed)
Summary: 4 MO ROV /NWS #   Vital Signs:  Patient profile:   60 year old male Weight:      259 pounds Temp:     99.4 degrees F oral Pulse rate:   99 / minute BP sitting:   124 / 86  (left arm)  Vitals Entered By: Tora Perches (November 26, 2009 8:15 AM) CC: f/u Is Patient Diabetic? No   Primary Care Provider:  Sonda Primes, MD  CC:  f/u.  History of Present Illness: The patient presents for a follow up of hypertension, B12 def, OA, hyperlipidemia C/o numbness.  Preventive Screening-Counseling & Management  Alcohol-Tobacco     Smoking Status: quit  Allergies: 1)  Zocor 2)  Crestor 3)  Pravastatin Sodium (Pravastatin Sodium)  Past History:  Past Medical History: Last updated: 08/28/2009 Current Problems:  HIATAL HERNIA (ICD-553.3) DUODENITIS, WITHOUT HEMORRHAGE (ICD-535.60) RENAL CYST (ICD-593.2) BELLS PALSY (ICD-351.0) HYPERCHOLESTEROLEMIA (ICD-272.0) DIVERTICULOSIS, COLON (ICD-562.10) SEBACCEOUS CYST-INFECTED (ICD-706.2) CARCINOMA, RENAL CELL (ICD-189.0) BRONCHITIS, ACUTE (ICD-466.0) LOW BACK PAIN (ICD-724.2) HYPERTENSION (ICD-401.9) HYPERTHYROIDISM (ICD-242.90) Vit B12 def 2009 PERIPHERAL VASCULAR DISEASE (ICD-443.9) PANCREATITIS, HX OF (ICD-V12.70) GERD (ICD-530.81) COPD (ICD-496) COLONIC POLYPS, HX OF (ICD-V12.72) ANXIETY (ICD-300.00) CHEST PAIN, UNSPECIFIED (ICD-786.50) ? MSK Borderline  low Vit B12 CAD  Past Surgical History: Last updated: 09/12/2008 Rt. iliofemoral bypass  Rt. 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   Mouth:  Oral mucosa and oropharynx without lesions or exudates.  Teeth in good repair. Lungs:  Normal respiratory effort, chest expands symmetrically. Lungs are clear to auscultation, no crackles or wheezes. Heart:   Normal rate and regular rhythm. S1 and S2 normal without gallop, murmur, click, rub or other extra sounds. Abdomen:  Bowel sounds positive,abdomen soft and non-tender without masses, organomegaly or hernias noted. Msk:  Lumbar-sacral spine is tender to palpation over paraspinal muscles and painfull with the ROM  Neurologic:  No cranial nerve deficits noted. Station and gait are normal. Plantar reflexes are down-going bilaterally. DTRs are symmetrical throughout. Sensory, motor and coordinative functions appear intact. Skin:  Intact without suspicious lesions or rashes Psych:  Cognition and judgment appear intact. Alert and cooperative with normal attention span and concentration. No apparent delusions, illusions, hallucinations   Impression & Recommendations:  Problem # 1:  B12 DEFICIENCY (ICD-266.2) Assessment Deteriorated  Risks of noncompliance with treatment discussed. Compliance encouraged. Ran out of Rx  Orders: Vit B12 1000 mcg (J3420) Admin of Therapeutic Inj  intramuscular or subcutaneous (16109)  Problem # 2:  CAD, NATIVE VESSEL (ICD-414.01) Assessment: Comment Only  His updated medication list for this problem includes:    Benazepril-hydrochlorothiazide 20-25 Mg Tabs (Benazepril-hydrochlorothiazide) .Marland Kitchen... 1 qam    Aspir-low 81 Mg Tbec (Aspirin) .Marland Kitchen... 1 once daily pc  Problem # 3:  HYPERCHOLESTEROLEMIA (ICD-272.0) Assessment: Unchanged  Labs Reviewed: SGOT: 19 (11/23/2009)   SGPT: 20 (11/23/2009)   HDL:23.60 (07/22/2009), 24.50 (03/23/2009)  LDL:DEL (11/24/2008), 100 (04/17/2008)  Chol:203 (07/22/2009), 207 (03/23/2009)  Trig:287.0 (07/22/2009), 228.0 (03/23/2009)  Problem # 4:  HYPERTENSION (ICD-401.9) Assessment: Improved  His updated medication list for this problem includes:    Benazepril-hydrochlorothiazide 20-25 Mg Tabs (Benazepril-hydrochlorothiazide) .Marland Kitchen... 1 qam  Problem # 5:  COPD (ICD-496) Assessment: Unchanged  Complete Medication List: 1)   Benazepril-hydrochlorothiazide 20-25 Mg Tabs (Benazepril-hydrochlorothiazide) .Marland Kitchen.. 1 qam 2)  Aspir-low 81 Mg Tbec (Aspirin) .Marland KitchenMarland KitchenMarland Kitchen 1  once daily pc 3)  Meloxicam 15 Mg Tabs (Meloxicam) .... One by mouth daily pc as needed pain 4)  Vitamin D3 1000 Unit Tabs (Cholecalciferol) .Marland Kitchen.. 1 qd 5)  Vitamin B-12 Cr 1000 Mcg Tbcr (Cyanocobalamin) .... Take one tablet by mouth daily 6)  Prevacid 30 Mg Cpdr (Lansoprazole) .... Once a day  otc 7)  Triamcinolone 0.5% Cream in Eucerin Lotion 1:10  .... Use two times a day as needed dry skin 8)  Levitra 20 Mg Tabs (Vardenafil hcl) .... 1/2-1 once daily as needed 9)  Tramadol Hcl 50 Mg Tabs (Tramadol hcl) .Marland Kitchen.. 1-2 tabs by mouth two times a day as needed pain  Patient Instructions: 1)  Please schedule a follow-up appointment in 4 months. 2)  BMP prior to visit, ICD-9: 3)  Hepatic Panel prior to visit, ICD-9:272.0 4)  B12 5)  CBC w/ Diff prior to visit, ICD-9: 6)  Use stretching exercises that I have provided (15 min. or longer every day) Prescriptions: TRAMADOL HCL 50 MG TABS (TRAMADOL HCL) 1-2 tabs by mouth two times a day as needed pain  #120 x 3   Entered and Authorized by:   Tresa Garter MD   Signed by:   Tresa Garter MD on 11/26/2009   Method used:   Electronically to        [facility] [phone]* (retail)       [address]       [place], [place]  [address]       Ph: [phone]       Fax: [phone]   RxID:   [id number]      Medication Administration  Injection # 1:    Medication: Vit B12 1000 mcg    Diagnosis: B12 DEFICIENCY (ICD-266.2)    Route: IM    Site: L deltoid    Exp Date: 09/2011    Lot #: [id number]    Mfr: [employer]    Comments: given    Patient tolerated injection without complications    Given by: Tora Perches (November 26, 2009 8:50 AM)  Orders Added: 1)  Vit B12 1000 mcg [J3420] 2)  Admin of Therapeutic Inj  intramuscular or subcutaneous [96372] 3)  Est. Patient Level IV [02725]

## 2010-11-30 NOTE — Assessment & Plan Note (Signed)
Summary: CONGESTION--SWEATY--COLD SYMPTOMS--MAY OR MAY NOT/FEVER STC   Vital Signs:  Patient profile:   60 year old male Weight:      260 pounds Temp:     98.9 degrees F oral Pulse rate:   92 / minute BP sitting:   124 / 54  (left arm)  Vitals Entered By: Tora Perches (January 18, 2010 2:56 PM) CC: cold sx,  cong. and sweats Is Patient Diabetic? No   Primary Care Provider:  Sonda Primes, MD  CC:  cold sx and cong. and sweats.  History of Present Illness: The patient presents with complaints of sore throat, fever, cough, sinus congestion and drainge of 7-10 days duration. Not better with OTC meds. Chest hurts with coughing. Can't sleep due to cough. Muscle aches are present.  The mucus is colored.   Preventive Screening-Counseling & Management  Alcohol-Tobacco     Smoking Status: never  Current Medications (verified): 1)  Benazepril-Hydrochlorothiazide 20-25 Mg  Tabs (Benazepril-Hydrochlorothiazide) .Marland Kitchen.. 1 Qam 2)  Aspir-Low 81 Mg Tbec (Aspirin) .Marland Kitchen.. 1 Once Daily Pc 3)  Meloxicam 15 Mg Tabs (Meloxicam) .... One By Mouth Daily Pc As Needed Pain 4)  Vitamin D3 1000 Unit  Tabs (Cholecalciferol) .Marland Kitchen.. 1 Qd 5)  Vitamin B-12 Cr 1000 Mcg  Tbcr (Cyanocobalamin) .... Take One Tablet By Mouth Daily 6)  Prevacid 30 Mg Cpdr (Lansoprazole) .... Once A Day  Otc 7)  Triamcinolone 0.5% Cream in Eucerin Lotion 1:10 .... Use Two Times A Day As Needed Dry Skin 8)  Levitra 20 Mg Tabs (Vardenafil Hcl) .... 1/2-1 Once Daily As Needed 9)  Tramadol Hcl 50 Mg Tabs (Tramadol Hcl) .Marland Kitchen.. 1-2 Tabs By Mouth Two Times A Day As Needed Pain  Allergies: 1)  Zocor 2)  Crestor 3)  Pravastatin Sodium (Pravastatin Sodium)  Past History:  Past Medical History: Last updated: 08/28/2009 Current Problems:  HIATAL HERNIA (ICD-553.3) DUODENITIS, WITHOUT HEMORRHAGE (ICD-535.60) RENAL CYST (ICD-593.2) BELLS PALSY (ICD-351.0) HYPERCHOLESTEROLEMIA (ICD-272.0) DIVERTICULOSIS, COLON (ICD-562.10) SEBACCEOUS  CYST-INFECTED (ICD-706.2) CARCINOMA, RENAL CELL (ICD-189.0) BRONCHITIS, ACUTE (ICD-466.0) LOW BACK PAIN (ICD-724.2) HYPERTENSION (ICD-401.9) HYPERTHYROIDISM (ICD-242.90) Vit B12 def 2009 PERIPHERAL VASCULAR DISEASE (ICD-443.9) PANCREATITIS, HX OF (ICD-V12.70) GERD (ICD-530.81) COPD (ICD-496) COLONIC POLYPS, HX OF (ICD-V12.72) ANXIETY (ICD-300.00) CHEST PAIN, UNSPECIFIED (ICD-786.50) ? 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   Mouth:  Erythematous throat mucosa and intranasal erythema.  Lungs:  Normal respiratory effort, chest expands symmetrically. Lungs are clear to auscultation, no crackles or wheezes. Heart:  Normal rate and regular rhythm. S1 and S2 normal without gallop, murmur, click, rub or other extra sounds. Abdomen:  Bowel sounds positive,abdomen soft and non-tender without masses, organomegaly or hernias noted. Skin:  Intact without suspicious lesions or rashes   Impression & Recommendations:  Problem # 1:  BRONCHITIS, ACUTE (ICD-466.0) Assessment New  His updated medication list for this problem includes:    Levaquin 500 Mg Tabs (Levofloxacin) .Marland Kitchen... 1 by mouth qd    Promethazine-codeine 6.25-10 Mg/70ml Syrp (Promethazine-codeine) .Marland Kitchen... 5-10 ml by mouth q id as needed cough    Tessalon Perles 100 Mg Caps (Benzonatate) .Marland Kitchen... 1-2 by mouth two times a day as needed cogh  Orders: T-2 View CXR, Same Day (71020.5TC)  Problem # 2:  COPD (ICD-496) Assessment: Deteriorated  Complete Medication List: 1)  Benazepril-hydrochlorothiazide 20-25 Mg Tabs (Benazepril-hydrochlorothiazide) .Marland Kitchen.. 1 qam 2)  Aspir-low 81 Mg Tbec (Aspirin) .Marland Kitchen.. 1 once daily pc 3)  Meloxicam 15 Mg Tabs (Meloxicam) .... One by mouth daily pc as needed  pain 4)  Vitamin D3 1000 Unit Tabs (Cholecalciferol) .Marland Kitchen.. 1 qd 5)  Vitamin B-12 Cr 1000 Mcg Tbcr (Cyanocobalamin) .... Take one tablet by mouth daily 6)  Prevacid 30 Mg Cpdr (Lansoprazole) .... Once a day  otc 7)  Triamcinolone 0.5% Cream in Eucerin Lotion 1:10  .... Use two times a day as needed dry skin 8)  Levitra 20 Mg Tabs (Vardenafil hcl) .... 1/2-1 once daily as needed 9)  Tramadol Hcl 50 Mg Tabs (Tramadol hcl) .Marland Kitchen.. 1-2 tabs by mouth two times a day as needed pain 10)  Levaquin 500 Mg Tabs (Levofloxacin) .Marland Kitchen.. 1 by mouth qd 11)  Promethazine-codeine 6.25-10 Mg/70ml Syrp (Promethazine-codeine) .... 5-10 ml by mouth q id as needed cough 12)  Tessalon Perles 100 Mg Caps (Benzonatate) .Marland Kitchen.. 1-2 by mouth two times a day as needed cogh  Patient Instructions: 1)  Call if you are not better in a reasonable amount of time or if worse. Go to ER if feeling really bad! Prescriptions: TESSALON PERLES 100 MG CAPS (BENZONATATE) 1-2 by mouth two times a day as needed cogh  #120 x 1   Entered and Authorized by:   Tresa Garter MD   Signed by:   Tresa Garter MD on 01/18/2010   Method used:   Print then Give to Patient   RxID:   [id number] PROMETHAZINE-CODEINE 6.25-10 MG/5ML SYRP (PROMETHAZINE-CODEINE) 5-10 ml by mouth q id as needed cough  #300 ml x 0   Entered and Authorized by:   Tresa Garter MD   Signed by:   Tresa Garter MD on 01/18/2010   Method used:   Print then Give to Patient   RxID:   [id number] LEVAQUIN 500 MG TABS (LEVOFLOXACIN) 1 by mouth qd  #10 x 0   Entered and Authorized by:   Tresa Garter MD   Signed by:   Tresa Garter MD on 01/18/2010   Method used:   Print then Give to Patient   RxID:   [id number]

## 2010-11-30 NOTE — Assessment & Plan Note (Signed)
Summary: PER CHECK OUT/SF  Medications Added BENAZEPRIL-HYDROCHLOROTHIAZIDE 20-25 MG TABS (BENAZEPRIL-HYDROCHLOROTHIAZIDE) Take 1 tablet by mouth once a day        Visit Type:  Follow-up Primary Provider:  Sonda Primes, MD  CC:  Occasional chest pains.  History of Present Illness: Patient is a 60 year old with a history of CAD, dyslipidemia, CV disease.  Adenosine myoview in 2009 was without ischemia.  I saw the patient in clininc 1 year agol Since seen he denies chest pains.  No signif SOB.  He does continue to smoke.  Notes some claudication symptoms in LE.   Current Medications (verified): 1)  Aspir-Low 81 Mg Tbec (Aspirin) .Marland Kitchen.. 1 Once Daily Pc 2)  Meloxicam 15 Mg Tabs (Meloxicam) .... One By Mouth Daily Pc As Needed Pain 3)  Vitamin D3 1000 Unit  Tabs (Cholecalciferol) .Marland Kitchen.. 1 Qd 4)  Vitamin B-12 Cr 1000 Mcg  Tbcr (Cyanocobalamin) .... Take One Tablet By Mouth Daily 5)  Prevacid 30 Mg Cpdr (Lansoprazole) .... Once A Day  Otc 6)  Triamcinolone 0.5% Cream in Eucerin Lotion 1:10 .... Use Two Times A Day As Needed Dry Skin 7)  Levitra 20 Mg Tabs (Vardenafil Hcl) .... 1/2-1 Once Daily As Needed 8)  Tramadol Hcl 50 Mg Tabs (Tramadol Hcl) .Marland Kitchen.. 1-2 Tabs By Mouth Two Times A Day As Needed Pain 9)  Benazepril-Hydrochlorothiazide 20-25 Mg Tabs (Benazepril-Hydrochlorothiazide) .... Take 1 Tablet By Mouth Once A Day 10)  Cialis 20 Mg Tabs (Tadalafil) .... 1/2 or 1 By Mouth Q 1-3 D Prn  Allergies: 1)  Zocor 2)  Crestor 3)  Pravastatin Sodium (Pravastatin Sodium)  Past History:  Past medical, surgical, family and social histories (including risk factors) reviewed, and no changes noted (except as noted below).  Past Medical History: Reviewed history from 06/01/2010 and no changes required. Current Problems:  HIATAL HERNIA (ICD-553.3) DUODENITIS, WITHOUT HEMORRHAGE (ICD-535.60) RENAL CYST (ICD-593.2) BELLS PALSY (ICD-351.0) HYPERCHOLESTEROLEMIA (ICD-272.0) DIVERTICULOSIS, COLON  (ICD-562.10) SEBACCEOUS CYST-INFECTED (ICD-706.2) CARCINOMA, RENAL CELL (ICD-189.0) BRONCHITIS, ACUTE (ICD-466.0) LOW BACK PAIN (ICD-724.2) HYPERTENSION (ICD-401.9) HYPERTHYROIDISM (ICD-242.90) Vit B12 def 2009 PERIPHERAL VASCULAR DISEASE (ICD-443.9) PANCREATITIS, HX OF (ICD-V12.70) GERD (ICD-530.81) COPD (ICD-496) COLONIC POLYPS, HX OF (ICD-V12.72) ANXIETY (ICD-300.00) CHEST PAIN, UNSPECIFIED (ICD-786.50) ? MSK Borderline  low Vit B12 CAD R meralgia paresthetica ED  Past Surgical History: Reviewed history from 09/12/2008 and no changes required. Rt. iliofemoral bypass  Rt. inguinal hernia Cryo rena tumor  Family History: Reviewed history from 04/28/2009 and no changes required. Family History HTN Family History of Colon Cancer: paternal grandmother  Social History: Reviewed history from 06/01/2010 and no changes required. 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  Neg to the above problem except as noted above.  Vital Signs:  Patient profile:   60 year old male Height:      70 inches Weight:      260.25 pounds BMI:     37.48 Pulse rate:   97 / minute Pulse rhythm:   regular Resp:     18 per minute BP sitting:   128 / 70  (left arm) Cuff size:   large  Vitals Entered By: Vikki Ports (September 17, 2010 11:49 AM)  Physical Exam  Additional Exam:  Patient is in NAD HEENT:  Normocephalic, atraumatic. EOMI, PERRLA.  Neck: JVP is normal. No thyromegaly. No bruits.  Lungs:  clear to auscultation. No rales no wheezes.  Heart: Regular rate and rhythm. Normal S1, S2. No S3.   No significant murmurs. PMI not displaced.  Abdomen: Obese.   Supple, nontender. Normal bowel sounds. No masses. No hepatomegaly.  Extremities:    No lower extremity edema.  Musculoskeletal :moving all extremities.  Neuro:   alert and oriented  x3.    EKG  Procedure date:  09/17/2010  Findings:      NSR.  97 bpm.  Impression & Recommendations:  Problem # 1:  CAD, NATIVE VESSEL (ICD-414.01) No symptms to sugg angina.    Problem # 2:  HYPERCHOLESTEROLEMIA (ICD-272.0) Not toleratnt to statins.  Will revuew with pharmacy.  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 1 once daily pc  Problem # 4:  PERIPHERAL VASCULAR DISEASE (ICD-443.9) Patient has f/u in [place]  Problem # 5:  HYPERTENSION (ICD-401.9) BP is controlled. His updated medication list for this problem includes:    Aspir-low 81 Mg Tbec (Aspirin) .Marland Kitchen... 1 once daily pc    Benazepril-hydrochlorothiazide 20-25 Mg Tabs (Benazepril-hydrochlorothiazide) .Marland Kitchen... Take 1 tablet by mouth once a day  Other Orders: EKG w/ Interpretation (93000)  Patient Instructions: 1)  Your physician wants you to follow-up in:12 months   You will receive a reminder letter in the mail two months in advance. If you don't receive a letter, please call our office to schedule the follow-up appointment.  Appended Document: PER CHECK OUT/SF Counselled on tobacco

## 2010-11-30 NOTE — Miscellaneous (Signed)
Summary: Orders Update  Clinical Lists Changes  Orders: Added new Test order of Carotid Duplex (Carotid Duplex) - Signed 

## 2010-12-02 NOTE — Letter (Signed)
Summary: [employer]   Imported By: Marylou Mccoy 11/22/2010 16:13:15  _____________________________________________________________________  External Attachment:    Type:   Image     Comment:   External Document

## 2010-12-02 NOTE — Progress Notes (Signed)
Summary: TESTOSTERONE LAB  Phone Note Other Incoming   Summary of Call: pt wants testosterone added to labs Initial call taken by: Ami Bullins CMA,  November 03, 2010 10:07 AM  Follow-up for Phone Call        ok Testosterone 780.79 Follow-up by: Tresa Garter MD,  November 03, 2010 11:47 PM  Additional Follow-up for Phone Call Additional follow up Details #1::        Faxed add-on slip to lab @ [phone] Additional Follow-up by: Orlan Leavens RMA,  November 04, 2010 4:02 PM

## 2010-12-02 NOTE — Assessment & Plan Note (Signed)
Summary: CPX /NWS  #   Vital Signs:  Patient profile:   60 year old male Height:      70 inches Weight:      261 pounds BMI:     37.58 Temp:     99.3 degrees F oral Pulse rate:   72 / minute Pulse rhythm:   regular Resp:     16 per minute BP sitting:   124 / 78  (left arm) Cuff size:   regular  Vitals Entered By: Lanier Prude, Beverly Gust) (November 10, 2010 2:13 PM) CC: CPX Is Patient Diabetic? No Comments pt is not taking Meloxicam or Levitra   Primary Care Provider:  Sonda Primes, MD  CC:  CPX.  History of Present Illness: The patient presents for a preventive health examination  C/o fatigue all the times, ED, snoring  Current Medications (verified): 1)  Aspir-Low 81 Mg Tbec (Aspirin) .Marland Kitchen.. 1 Once Daily Pc 2)  Meloxicam 15 Mg Tabs (Meloxicam) .... One By Mouth Daily Pc As Needed Pain 3)  Vitamin D3 1000 Unit  Tabs (Cholecalciferol) .Marland Kitchen.. 1 Qd 4)  Vitamin B-12 Cr 1000 Mcg  Tbcr (Cyanocobalamin) .... Take One Tablet By Mouth Daily 5)  Prevacid 30 Mg Cpdr (Lansoprazole) .... Once A Day  Otc 6)  Triamcinolone 0.5% Cream in Eucerin Lotion 1:10 .... Use Two Times A Day As Needed Dry Skin 7)  Levitra 20 Mg Tabs (Vardenafil Hcl) .... 1/2-1 Once Daily As Needed 8)  Tramadol Hcl 50 Mg Tabs (Tramadol Hcl) .Marland Kitchen.. 1-2 Tabs By Mouth Two Times A Day As Needed Pain 9)  Benazepril-Hydrochlorothiazide 20-25 Mg Tabs (Benazepril-Hydrochlorothiazide) .... Take 1 Tablet By Mouth Once A Day 10)  Cialis 20 Mg Tabs (Tadalafil) .... 1/2 or 1 By Mouth Q 1-3 D Prn  Allergies (verified): 1)  Zocor 2)  Crestor 3)  Pravastatin Sodium (Pravastatin Sodium)  Past History:  Past Medical History: Last updated: 06/01/2010 Current Problems:  HIATAL HERNIA (ICD-553.3) DUODENITIS, WITHOUT HEMORRHAGE (ICD-535.60) RENAL CYST (ICD-593.2) BELLS PALSY (ICD-351.0) HYPERCHOLESTEROLEMIA (ICD-272.0) DIVERTICULOSIS, COLON (ICD-562.10) SEBACCEOUS CYST-INFECTED (ICD-706.2) CARCINOMA, RENAL CELL  (ICD-189.0) BRONCHITIS, ACUTE (ICD-466.0) LOW BACK PAIN (ICD-724.2) HYPERTENSION (ICD-401.9) HYPERTHYROIDISM (ICD-242.90) Vit B12 def 2009 PERIPHERAL VASCULAR DISEASE (ICD-443.9) PANCREATITIS, HX OF (ICD-V12.70) GERD (ICD-530.81) COPD (ICD-496) COLONIC POLYPS, HX OF (ICD-V12.72) ANXIETY (ICD-300.00) CHEST PAIN, UNSPECIFIED (ICD-786.50) ? MSK Borderline  low Vit B12 CAD R meralgia paresthetica ED  Past Surgical History: Last updated: 09/12/2008 Rt. iliofemoral bypass  Rt. 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  The patient denies anorexia, fever, weight loss, weight gain, vision loss, decreased hearing, hoarseness, chest pain, syncope, peripheral edema, prolonged cough, headaches, hemoptysis, abdominal pain, melena, hematochezia, hematuria, incontinence, genital sores, muscle weakness, suspicious skin lesions, transient blindness, difficulty walking, depression, unusual weight change, abnormal bleeding, enlarged lymph nodes, angioedema, and testicular masses.         GERD  Physical Exam  General:  overweight-appearing.   Head:  Normocephalic and atraumatic without obvious abnormalities. No apparent alopecia or balding. Eyes:  No corneal or conjunctival inflammation noted. EOMI.  Perrla. Ears:  External ear exam shows no significant lesions or deformities.  Otoscopic examination reveals clear canals, tympanic membranes are intact bilaterally without bulging, retraction, inflammation or discharge. Hearing is grossly normal bilaterally. Nose:   External nasal examination shows no deformity or inflammation. Nasal mucosa are pink and moist without lesions or exudates. Mouth:  Oral mucosa and oropharynx without lesions or exudates.  Teeth in good repair. Dark red small oropharynx Neck:  No deformities, masses, or tenderness noted. Lungs:  Normal respiratory effort, chest expands symmetrically. Lungs are clear to auscultation, no crackles or wheezes. Heart:  Normal rate and regular rhythm. S1 and S2 normal without gallop, click, rub or other extra sounds. 1/6 systolic heart murmur  Abdomen:  Bowel sounds positive,abdomen soft and non-tender without masses, organomegaly or hernias noted. Rectal:  Large skin tag perianalno hemorrhoids and no masses.  Stool G(-) Prostate:  no nodules, no asymmetry, and 1+ enlarged.   Msk:  Lumbar-sacral spine is tender to palpation over paraspinal muscles and painfull with the ROM  Pulses:  R and L carotid,radial,femoral,dorsalis pedis and posterior tibial pulses are full and equal bilaterally Extremities:  B no edema Neurologic:  numb oval large area on R thigh Skin:  Intact without suspicious lesions or rashes Psych:  Cognition and judgment appear intact. Alert and cooperative with normal attention span and concentration. No apparent delusions, illusions, hallucinations   Impression & Recommendations:  Problem # 1:  HEALTH MAINTENANCE EXAM (ICD-V70.0) Assessment New Health and age related issues were discussed. Available screening tests and vaccinations were discussed as well. Healthy life style including good diet and exercise was discussed.  The labs were reviewed with the patient.   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 1 once daily pc  Problem # 4:  TOBACCO USER  (ICD-305.1) Assessment: Unchanged  Encouraged smoking cessation and discussed different methods for smoking cessation.   Problem # 5:  ANAL FISSURE (ICD-565.0 new and an old /tag Assessment: New Anusol HC Rx supp  Problem # 6:  POLYCYTHEMIA (ICD-289.0) Assessment: Deteriorated Phlebotomy here  or blood donation at [facility]  Problem # 7:  HYPOGONADISM (ICD-257.2) Assessment: New Options to treat discussed. Info given  Problem # 8:  SNORING (ICD-786.09) Assessment: Deteriorated  His updated medication list for this problem includes:    Benazepril-hydrochlorothiazide 20-25 Mg Tabs (Benazepril-hydrochlorothiazide) .Marland Kitchen... Take 1 tablet by mouth once a day  Orders: Sleep Study (Sleep Study)  Problem # 9:  ERECTILE DYSFUNCTION, ORGANIC (ICD-607.84) Assessment: Deteriorated  His updated medication list for this problem includes:    Levitra 20 Mg Tabs (Vardenafil hcl) .Marland Kitchen... 1/2-1 once daily as needed    Cialis 20 Mg Tabs (Tadalafil) .Marland Kitchen... 1/2 or 1 by mouth q 1-3 d prn  Complete Medication List: 1)  Aspir-low 81 Mg Tbec (Aspirin) .Marland Kitchen.. 1 once daily pc 2)  Meloxicam 15 Mg Tabs (Meloxicam) .... One by mouth daily pc as needed pain 3)  Vitamin D3 1000 Unit Tabs (Cholecalciferol) .Marland Kitchen.. 1 qd 4)  Vitamin B-12 Cr 1000 Mcg Tbcr (Cyanocobalamin) .... Take one tablet by mouth daily 5)  Prevacid 30 Mg Cpdr (Lansoprazole) .... Once a day  otc 6)  Triamcinolone 0.5% Cream in Eucerin Lotion 1:10  .... Use two times a day as needed dry  skin 7)  Levitra 20 Mg Tabs (Vardenafil hcl) .... 1/2-1 once daily as needed 8)  Tramadol Hcl 50 Mg Tabs (Tramadol hcl) .Marland Kitchen.. 1-2 tabs by mouth two times a day as needed pain 9)  Benazepril-hydrochlorothiazide 20-25 Mg Tabs (Benazepril-hydrochlorothiazide) .... Take 1 tablet by mouth once a day 10)  Cialis 20 Mg Tabs (Tadalafil) .... 1/2 or 1 by mouth q 1-3 d prn 11)  Livalo 4 Mg Tabs (Pitavastatin calcium) .Marland Kitchen.. 1 by mouth qd 12)  Edarbi 80 Mg Tabs (Azilsartan  medoxomil) .Marland Kitchen.. 1 by mouth once daily for blood pressure 13)  Anusol-hc 25 Mg Supp (Hydrocortisone acetate) .Marland Kitchen.. 1 pr two times a day for hemorrhoids  Patient Instructions: 1)  Please schedule a follow-up appointment in 2 months. 2)  Try Raynelle Chary 1 a day in place of Benazepr/HCT 3)  BMP prior to visit, ICD-9: 4)  Hepatic Panel prior to visit, ICD-9: 5)  Lipid Panel prior to visit, ICD-9: 6)  CBC w/ Diff prior to visit, ICD-9: 7)  CK 272.0 8)  ferritin 9)  testost 780.79 Prescriptions: ANUSOL-HC 25 MG SUPP (HYDROCORTISONE ACETATE) 1 pr two times a day for hemorrhoids  #20 x 3   Entered and Authorized by:   Tresa Garter MD   Signed by:   Tresa Garter MD on 11/10/2010   Method used:   Print then Give to Patient   RxID:   [id number] LIVALO 4 MG TABS (PITAVASTATIN CALCIUM) 1 by mouth qd  #90 x 3   Entered and Authorized by:   Tresa Garter MD   Signed by:   Tresa Garter MD on 11/10/2010   Method used:   Print then Give to Patient   RxID:   [id number]    Orders Added: 1)  Sleep Study [Sleep Study] 2)  Est. Patient 40-64 years [99396] 3)  Est. Patient Level IV [84696]   Immunization History:  Influenza Immunization History:    Influenza:  historical (09/15/2010)  Pneumovax Immunization History:    Pneumovax:  historical (09/24/2008)   Immunization History:  Influenza Immunization History:    Influenza:  Historical (09/15/2010)  Pneumovax Immunization History:    Pneumovax:  Historical (09/24/2008)

## 2010-12-17 ENCOUNTER — Institutional Professional Consult (permissible substitution): Payer: Self-pay | Admitting: Pulmonary Disease

## 2010-12-29 ENCOUNTER — Institutional Professional Consult (permissible substitution): Payer: Self-pay | Admitting: Pulmonary Disease

## 2011-01-05 ENCOUNTER — Other Ambulatory Visit: Payer: Self-pay

## 2011-01-12 ENCOUNTER — Ambulatory Visit: Payer: Self-pay | Admitting: Internal Medicine

## 2011-01-24 ENCOUNTER — Institutional Professional Consult (permissible substitution): Payer: Self-pay | Admitting: Pulmonary Disease

## 2011-01-25 ENCOUNTER — Encounter: Payer: Self-pay | Admitting: Pulmonary Disease

## 2011-01-27 ENCOUNTER — Encounter: Payer: Self-pay | Admitting: Pulmonary Disease

## 2011-01-27 ENCOUNTER — Ambulatory Visit (INDEPENDENT_AMBULATORY_CARE_PROVIDER_SITE_OTHER): Payer: 59 | Admitting: Pulmonary Disease

## 2011-01-27 VITALS — BP 102/70 | HR 77 | Temp 99.0°F | Ht 70.0 in | Wt 253.8 lb

## 2011-01-27 DIAGNOSIS — R0609 Other forms of dyspnea: Secondary | ICD-10-CM

## 2011-01-27 DIAGNOSIS — R0989 Other specified symptoms and signs involving the circulatory and respiratory systems: Secondary | ICD-10-CM

## 2011-01-27 NOTE — Progress Notes (Signed)
Subjective:    Patient ID: [name], male    DOB: [birth year], 60 y.o.   MRN: [id number]  HPI 59 yo male for sleep evaluation.  He has noticed problem with snoring and apnea for years.  This seems to be getting worse.  He feels tired during the day.  He gets jerky movements at night, and a dry mouth.  He is a restless sleeper.  He can't sleep on his back.  He goes to bed at 10pm, and falls asleep quickly.  He wakes up at 6am.  He occasionally gets headaches in the morning.  He drinks several cups of coffee during the day.  He does not use anything to help him sleep at night.  He will mumble in his sleep.  He occasionally grinds his teeth.  He will get funny feelings in his legs about twice per week.  The patient denies sleep walking, or nightmares.  The patient denies sleep hallucinations, sleep paralysis, or cataplexy.  His weight has been steady.  There is no history of thyroid disease, or depression.  He continues to smoke.  He does not drink much alcohol.     Epworth score is 11 out of 24.  Past Medical History  Diagnosis Date  . Hiatal hernia   . Duodenitis   . Renal cyst   . Bell's palsy   . Hypercholesteremia   . Diverticulosis   . Cyst     sebacceous  . Renal cell carcinoma   . Bronchitis   . Low back pain   . Hypertension   . Hyperthyroidism   . Vitamin B12 deficiency   . PVD (peripheral vascular disease)   . Pancreatitis   . GERD (gastroesophageal reflux disease)   . COPD (chronic obstructive pulmonary disease)   . Colon polyps   . Anxiety   . Coronary artery disease   . Meralgia paresthetica     right  . Erectile dysfunction   . Chest pain, unspecified      Family History  Problem Relation Age of Onset  . Colon cancer Paternal Grandmother   . Hypertension    . Stroke Father      History   Social History  . Marital Status: Divorced    Spouse Name: N/A    Number of Children: N/A  . Years of Education: N/A   Occupational History  .  business Production designer, theatre/television/film     [employer]   Social History Main Topics  . Smoking status: Current Everyday Smoker -- 1.5 packs/day for 40 years    Types: Cigarettes  . Smokeless tobacco: Not on file  . Alcohol Use: Yes  . Drug Use: Not on file  . Sexually Active: Not on file   Other Topics Concern  . Not on file   Social History Narrative  . No narrative on file     Allergies  Allergen Reactions  . Pravastatin Sodium     REACTION: gasy, constipated  . Rosuvastatin     REACTION: myalgias  . Simvastatin     REACTION: myalgia     Outpatient Prescriptions Prior to Visit  Medication Sig Dispense Refill  . aspirin 81 MG tablet Take 81 mg by mouth daily.        . Cholecalciferol (VITAMIN D3) 1000 UNITS CAPS Take 1 capsule by mouth daily.        . tadalafil (CIALIS) 20 MG tablet Take 1/2 to 1 tab by mouth every 1 to 3 days as needed       .  traMADol (ULTRAM) 50 MG tablet Take 1 to 2 tabs by mouth two times a day as needed       . triamcinolone (KENALOG) 0.5 % cream Apply topically 2 (two) times daily as needed.        . vardenafil (LEVITRA) 20 MG tablet Take 1/2 to 1 tab by mouth as needed       . vitamin B-12 (CYANOCOBALAMIN) 1000 MCG tablet Take 1,000 mcg by mouth daily.        . Azilsartan Medoxomil (EDARBI) 80 MG TABS Take 1 tablet by mouth daily.        . benazepril-hydrochlorthiazide (LOTENSIN HCT) 20-25 MG per tablet Take 1 tablet by mouth daily.        . Pitavastatin Calcium (LIVALO) 4 MG TABS Take 1 tablet by mouth daily.        . lansoprazole (PREVACID) 30 MG capsule Take 15 mg by mouth daily.       . meloxicam (MOBIC) 15 MG tablet Take 15 mg by mouth daily as needed.            Review of Systems  Constitutional: Positive for fatigue. Negative for fever and unexpected weight change.  HENT: Positive for congestion and sneezing. Negative for sore throat and mouth sores.   Eyes: Negative for pain.  Respiratory: Positive for apnea and cough.   Cardiovascular: Negative for chest pain  and leg swelling.  Gastrointestinal: Negative for abdominal pain.  Musculoskeletal: Negative for gait problem.  Skin: Negative for rash.  Neurological: Positive for headaches.  Hematological: Negative for adenopathy.  Psychiatric/Behavioral: Negative for behavioral problems and agitation.       Objective:   Physical Exam Filed Vitals:   01/27/11 1031  BP: 102/70  Pulse: 77  Temp:    General - Obese, no distress HEENT - PERRLA, EOMI, no sinus tenderness, clear sinus drainage, MP 4, elongated uvula, enlarged tongue, no exudate, no LAN, no thyromegaly Cardiovascular - S1S2 regular, no murmur, peripheral pulses symmetric Chest - no wheezing or rales, no dullness Abd - Soft, nontender, no masses, normal bowel sounds Ext - minimal ankle edema, no cyanosis or clubbing Neuro - A&O x 3, normal strength, CN II to 12 intact       Assessment & Plan:   SNORING He has snoring, sleep disruption and daytime sleepiness.  He has a history of heart disease.  I am concerned that he could have sleep apnea.  To further assess this will arrange for sleep test.  Explained how sleep apnea can affect the patient's health.  Driving precautions and importance of weight loss were discussed.      Updated Medication List Outpatient Encounter Prescriptions as of 01/27/2011  Medication Sig Dispense Refill  . aspirin 81 MG tablet Take 81 mg by mouth daily.        . Cholecalciferol (VITAMIN D3) 1000 UNITS CAPS Take 1 capsule by mouth daily.        . hydrocortisone (ANUSOL-HC) 25 MG suppository prn      . lansoprazole (PREVACID) 15 MG capsule Take 15 mg by mouth daily.        . tadalafil (CIALIS) 20 MG tablet Take 1/2 to 1 tab by mouth every 1 to 3 days as needed       . traMADol (ULTRAM) 50 MG tablet Take 1 to 2 tabs by mouth two times a day as needed       . triamcinolone (KENALOG) 0.5 % cream Apply topically 2 (two) times daily  as needed.        . vardenafil (LEVITRA) 20 MG tablet Take 1/2 to 1 tab by  mouth as needed       . vitamin B-12 (CYANOCOBALAMIN) 1000 MCG tablet Take 1,000 mcg by mouth daily.        . Azilsartan Medoxomil (EDARBI) 80 MG TABS Take 1 tablet by mouth daily.        . benazepril-hydrochlorthiazide (LOTENSIN HCT) 20-25 MG per tablet Take 1 tablet by mouth daily.        . Pitavastatin Calcium (LIVALO) 4 MG TABS Take 1 tablet by mouth daily.        Marland Kitchen DISCONTD: lansoprazole (PREVACID) 30 MG capsule Take 15 mg by mouth daily.       Marland Kitchen DISCONTD: meloxicam (MOBIC) 15 MG tablet Take 15 mg by mouth daily as needed.

## 2011-01-27 NOTE — Patient Instructions (Signed)
Will schedule sleep test and call to schedule follow up after sleep test reviewed 

## 2011-01-27 NOTE — Progress Notes (Deleted)
  Subjective:    Patient ID: [name], male    DOB: [birth year], 60 y.o.   MRN: [id number]  HPI    Review of Systems  HENT: Positive for congestion and sneezing.   Respiratory: Positive for shortness of breath.   Musculoskeletal: Positive for joint swelling.  Neurological: Positive for headaches.       Objective:   Physical Exam        Assessment & Plan:

## 2011-01-27 NOTE — Assessment & Plan Note (Signed)
He has snoring, sleep disruption and daytime sleepiness.  He has a history of heart disease.  I am concerned that he could have sleep apnea.  To further assess this will arrange for sleep test.  Explained how sleep apnea can affect the patient's health.  Driving precautions and importance of weight loss were discussed.

## 2011-01-30 HISTORY — PX: CORONARY ANGIOPLASTY: SHX604

## 2011-02-12 ENCOUNTER — Inpatient Hospital Stay (HOSPITAL_COMMUNITY)
Admission: EM | Admit: 2011-02-12 | Discharge: 2011-02-15 | DRG: 247 | Disposition: A | Discharge status: Home / Self Care | Payer: 59 | Attending: Internal Medicine | Admitting: Internal Medicine

## 2011-02-12 DIAGNOSIS — Z7902 Long term (current) use of antithrombotics/antiplatelets: Secondary | ICD-10-CM

## 2011-02-12 DIAGNOSIS — J4489 Other specified chronic obstructive pulmonary disease: Secondary | ICD-10-CM | POA: Diagnosis present

## 2011-02-12 DIAGNOSIS — I10 Essential (primary) hypertension: Secondary | ICD-10-CM | POA: Diagnosis present

## 2011-02-12 DIAGNOSIS — I2510 Atherosclerotic heart disease of native coronary artery without angina pectoris: Secondary | ICD-10-CM | POA: Diagnosis present

## 2011-02-12 DIAGNOSIS — E785 Hyperlipidemia, unspecified: Secondary | ICD-10-CM | POA: Diagnosis present

## 2011-02-12 DIAGNOSIS — G4730 Sleep apnea, unspecified: Secondary | ICD-10-CM | POA: Diagnosis present

## 2011-02-12 DIAGNOSIS — Z79899 Other long term (current) drug therapy: Secondary | ICD-10-CM

## 2011-02-12 DIAGNOSIS — F17200 Nicotine dependence, unspecified, uncomplicated: Secondary | ICD-10-CM | POA: Diagnosis present

## 2011-02-12 DIAGNOSIS — I2119 ST elevation (STEMI) myocardial infarction involving other coronary artery of inferior wall: Principal | ICD-10-CM | POA: Diagnosis present

## 2011-02-12 DIAGNOSIS — J449 Chronic obstructive pulmonary disease, unspecified: Secondary | ICD-10-CM | POA: Diagnosis present

## 2011-02-12 DIAGNOSIS — Z7982 Long term (current) use of aspirin: Secondary | ICD-10-CM

## 2011-02-12 DIAGNOSIS — I70209 Unspecified atherosclerosis of native arteries of extremities, unspecified extremity: Secondary | ICD-10-CM | POA: Diagnosis present

## 2011-02-13 DIAGNOSIS — I2510 Atherosclerotic heart disease of native coronary artery without angina pectoris: Secondary | ICD-10-CM

## 2011-02-13 LAB — BASIC METABOLIC PANEL
BUN: 19 mg/dL (ref 6–23)
CO2: 30 mEq/L (ref 19–32)
Chloride: 101 mEq/L (ref 96–112)
Creatinine, Ser: 1.1 mg/dL (ref 0.4–1.5)
GFR calc Af Amer: >60 mL/min (ref >60)
Potassium: 4.2 mEq/L (ref 3.5–5.1)

## 2011-02-13 LAB — CARDIAC PANEL(CRET KIN+CKTOT+MB+TROPI)
CK, MB: 270.4 ng/mL (ref 0.3–4.0)
CK, MB: 151.5 ng/mL (ref 0.3–4.0)
Relative Index: 9 — ABNORMAL HIGH (ref 0.0–2.5)
Total CK: 3312 U/L — ABNORMAL HIGH (ref 7–232)
Total CK: 2971 U/L — ABNORMAL HIGH (ref 7–232)
Total CK: 1691 U/L — ABNORMAL HIGH (ref 7–232)
Troponin I: 67.93 ng/mL (ref 0.00–0.06)
Troponin I: >100 ng/mL (ref 0.00–0.06)

## 2011-02-13 LAB — COMPREHENSIVE METABOLIC PANEL
Albumin: 3.6 g/dL (ref 3.5–5.2)
BUN: 18 mg/dL (ref 6–23)
Calcium: 9.1 mg/dL (ref 8.4–10.5)
Creatinine, Ser: 0.91 mg/dL (ref 0.4–1.5)
Glucose, Bld: 107 mg/dL — ABNORMAL HIGH (ref 70–99)
Potassium: 4.9 mEq/L (ref 3.5–5.1)
Total Protein: 6.2 g/dL (ref 6.0–8.3)

## 2011-02-13 LAB — DIFFERENTIAL
Eosinophils Absolute: 0.1 10*3/uL (ref 0.0–0.7)
Eosinophils Absolute: 0.2 10*3/uL (ref 0.0–0.7)
Eosinophils Relative: 2 % (ref 0–5)
Lymphocytes Relative: 27 % (ref 12–46)
Lymphs Abs: 3.2 10*3/uL (ref 0.7–4.0)
Lymphs Abs: 3.2 10*3/uL (ref 0.7–4.0)
Monocytes Relative: 7 % (ref 3–12)
Monocytes Relative: 8 % (ref 3–12)
Neutrophils Relative %: 64 % (ref 43–77)

## 2011-02-13 LAB — LIPID PANEL
Cholesterol: 181 mg/dL (ref 0–200)
HDL: 20 mg/dL — ABNORMAL LOW (ref >39)
LDL Cholesterol: 97 mg/dL (ref 0–99)
Total CHOL/HDL Ratio: 9.1 RATIO
Triglycerides: 321 mg/dL — ABNORMAL HIGH (ref <150)
VLDL: 64 mg/dL — ABNORMAL HIGH (ref 0–40)

## 2011-02-13 LAB — CBC
HCT: 46.7 % (ref 39.0–52.0)
MCH: 31.2 pg (ref 26.0–34.0)
MCH: 31.3 pg (ref 26.0–34.0)
MCV: 90.4 fL (ref 78.0–100.0)
MCV: 90.5 fL (ref 78.0–100.0)
Platelets: 182 10*3/uL (ref 150–400)
Platelets: 185 10*3/uL (ref 150–400)
RBC: 5.16 MIL/uL (ref 4.22–5.81)
RDW: 13.7 % (ref 11.5–15.5)

## 2011-02-13 LAB — HEMOGLOBIN A1C: Hgb A1c MFr Bld: 5.4 % (ref <5.7)

## 2011-02-14 ENCOUNTER — Other Ambulatory Visit: Payer: Self-pay

## 2011-02-14 ENCOUNTER — Other Ambulatory Visit: Payer: Self-pay | Admitting: Internal Medicine

## 2011-02-14 DIAGNOSIS — E538 Deficiency of other specified B group vitamins: Secondary | ICD-10-CM

## 2011-02-14 DIAGNOSIS — E7800 Pure hypercholesterolemia, unspecified: Secondary | ICD-10-CM

## 2011-02-14 DIAGNOSIS — R5383 Other fatigue: Secondary | ICD-10-CM

## 2011-02-14 DIAGNOSIS — R5381 Other malaise: Secondary | ICD-10-CM

## 2011-02-14 LAB — BASIC METABOLIC PANEL
CO2: 26 mEq/L (ref 19–32)
Calcium: 8.9 mg/dL (ref 8.4–10.5)
Chloride: 104 mEq/L (ref 96–112)
Creatinine, Ser: 0.92 mg/dL (ref 0.4–1.5)
GFR calc Af Amer: >60 mL/min (ref >60)
Sodium: 136 mEq/L (ref 135–145)

## 2011-02-14 LAB — CBC
Hemoglobin: 15.9 g/dL (ref 13.0–17.0)
MCH: 31.2 pg (ref 26.0–34.0)
MCHC: 34.1 g/dL (ref 30.0–36.0)
Platelets: 177 10*3/uL (ref 150–400)
RBC: 5.1 MIL/uL (ref 4.22–5.81)

## 2011-02-14 LAB — POCT ACTIVATED CLOTTING TIME: Activated Clotting Time: 476 seconds

## 2011-02-15 DIAGNOSIS — I2119 ST elevation (STEMI) myocardial infarction involving other coronary artery of inferior wall: Secondary | ICD-10-CM

## 2011-02-15 LAB — BASIC METABOLIC PANEL
CO2: 24 mEq/L (ref 19–32)
Calcium: 9 mg/dL (ref 8.4–10.5)
Creatinine, Ser: 0.94 mg/dL (ref 0.4–1.5)
GFR calc Af Amer: >60 mL/min (ref >60)
Sodium: 138 mEq/L (ref 135–145)

## 2011-02-18 ENCOUNTER — Ambulatory Visit: Payer: Self-pay | Admitting: Internal Medicine

## 2011-02-21 ENCOUNTER — Telehealth: Payer: Self-pay | Admitting: Internal Medicine

## 2011-02-21 NOTE — Telephone Encounter (Signed)
I would not recommend that he go to [place].  I would like him to come back to clinic first.  He should keep appt.

## 2011-02-21 NOTE — Telephone Encounter (Signed)
Called patient back. He is aware that Dr.Ross does not want him to travel at this time and needs to keep his post hospital appointment with her.

## 2011-02-21 NOTE — Telephone Encounter (Signed)
Called patient back. He had a heart cath and stent placed last week. Arm stick healing well per the patient's observation. He wanted to know if he could drive or fly to [place] on Thursday or Friday. Driving would entail about 10 hours and he would be traveling with another person. He has a follow up appointment with Dr.Ross on May 1st. Advised will discuss with Dr.Ross and call him back.

## 2011-02-21 NOTE — H&P (Signed)
  [name], [name]             ACCOUNT NO.:  [id number]  MEDICAL RECORD NO.:  [id number]           PATIENT TYPE:  I  LOCATION:  2902                         FACILITY:  [facility]  PHYSICIAN:  Armanda Magic, M.D.     DATE OF BIRTH:  10/27/1951  DATE OF ADMISSION:  02/12/2011 DATE OF DISCHARGE:                             HISTORY & PHYSICAL   PRIMARY PHYSICIAN:  Pricilla Riffle, MD, [facility]  CHIEF COMPLAINT:  Chest pain.  HISTORY OF PRESENT ILLNESS:  This is a 60 year old white male with a history of peripheral vascular disease status post right iliofemoral bypass, hypertension, dyslipidemia who was in his usual state of health until a few days ago when he started having epigastric pain described as a burning off and on which he thought was an indigestion.  Today, he played golf and around 8 p.m. had recurrent chest pain which currently is 6/10 and went to the emergency room.  He denied any nausea, vomiting, diaphoresis.  Pain radiated to the back and across the chest.  EKG in the ER shows sinus rhythm with 3-mm ST elevation inferiorly and 3 mm __________ ST depression and one in AVL as well as V2.  PAST MEDICAL HISTORY: 1. Peripheral vascular disease status post right iliofemoral bypass. 2. Hypertension. 3. Dyslipidemia. 4. COPD.  PAST SURGICAL HISTORY:  Radial iliofemoral bypass.  ALLERGIES:  None.  SOCIAL HISTORY:  He is married.  He occasionally drinks alcohol.  He does smoke one half-pack of cigarettes daily for greater than 40 years.  FAMILY HISTORY:  His father died at 81 from CVA, his mother is alive and well at 70.  MEDICATIONS: 1. Benazepril HCT 20/12.5 mg daily. 2. Prevacid 20 mg daily. 3. Aspirin 81 mg daily. 4. Multivitamin.  REVIEW OF SYSTEMS:  Otherwise as stated in the HPI is negative.  PHYSICAL EXAMINATION:  VITAL SIGNS:  Blood pressure is 131/78, heart rate 70s, O2 saturations 100% on 2 liters. GENERAL:  This is a well-developed, well-nourished male in  no acute distress. HEENT:  Benign. NECK:  Supple without lymphadenopathy.  Carotid upstrokes are +2 bilaterally.  No bruits. LUNGS:  Clear to auscultation throughout. HEART :  Regular rate and rhythm.  No murmurs, rubs or gallops.  Normal S1, S2. ABDOMEN:  Soft, nontender, nondistended with active bowel sounds.  No hepatosplenomegaly. EXTREMITIES:  No cyanosis, erythema or edema.  Labs are pending.  Chest x-ray is pending.  EKG shows acute inferior lateral MI.  ASSESSMENT: 1. ST elevation myocardial infarction inferiorly. 2. Hypertension. 3. Dyslipidemia. 4. Tobacco abuse ongoing. 5. Chronic obstructive pulmonary disease.  PLAN:  Emergent cath per Dr. Kirke Corin.  Aspirin, Plavix and heparin.  No beta-blocker secondary to intermittent bradycardia.  We will check a fasting lipid panel in the morning. Further workup per Dr. Kirke Corin.     Armanda Magic, M.D.     TT/MEDQ  D:  02/14/2011  T:  02/14/2011  Job:  [id number]  cc:   Lorine Bears, MD  Electronically Signed by Armanda Magic M.D. on 02/21/2011 07:44:13 PM

## 2011-02-27 NOTE — Discharge Summary (Signed)
NAMEKARIM, AIELLO             ACCOUNT NO.:  [id number]  MEDICAL RECORD NO.:  [id number]           PATIENT TYPE:  I  LOCATION:  2037                         FACILITY:  [facility]  PHYSICIAN:  Doreather Hoxworth M. [place], M.D.  DATE OF BIRTH:  [birth year]  DATE OF ADMISSION:  02/12/2011 DATE OF DISCHARGE:  02/15/2011                              DISCHARGE SUMMARY   PRIMARY CARDIOLOGIST:  Pricilla Riffle, MD, [facility]  PRIMARY CARE PROVIDER:  Georgina Quint. Plotnikov, MD  DISCHARGE DIAGNOSES: 1. Acute inferior ST-elevation myocardial infarction due to distal     right coronary artery occlusion with another 80% stenosis in the     mid segment.  Status post 2 drug-eluting stents to the distal and     mid right coronary artery this admission. 2. Hypertension. 3. Dyslipidemia. 4. Tobacco abuse.  SECONDARY DIAGNOSES: 1. Sleep apnea. 2. Peripheral artery disease status post right iliofemoral bypass.  PROCEDURE/DIAGNOSTICS PERFORMED DURING HOSPITALIZATION: 1. Left heart catheterization with coronary angiography and left     ventricular angiography.     a.     Status post successful placement of 2 PROMUS Element drug-      eluting stent to the distal as well as mid segment of the right      coronary artery.  3.0 mm x 24 mm and 3.5 mm x 20 mm.     b.     Preserved left ventricular systolic function, estimated      ejection fraction 55%.  There is borderline inferior wall      hypokinesis.     c.     Mid first posterolateral branch with 60% stenosis.  70%      lesion in the mid right PDA.     d.     Left main coronary artery was normal in size and free of      significant disease.     e.     Left anterior descending with 50% ostial stenosis, 40% mid      segment stenosis, first diagonal with 30% proximal stenosis and      second diagonal was free of disease.     f.     Left circumflex was nondominant, OM 40% proximal stenosis,      OM-2 free of significant disease, and OM-3 with a tubular 50%  stenosis proximally.  REASON FOR HOSPITALIZATION:  This is a 60 year old gentleman without known cardiac history, history of peripheral artery disease who presented to the emergency department with complaints of chest pain after playing golf.  The patient complained of intermittent chest pain over the last 5 days which was 9/10.  In the emergency department, the patient was found to have ST elevations in the inferior leads, approximately 3 mm.  A code STEMI was called and the patient was brought emergently to the cardiac cath lab by Dr. Kirke Corin.    HOSPITAL COURSE: In the cath lab, emergent consent was obtained.  It was noted the patient had a distal right coronary artery occlusion with another 80% stenosis in the mid segment.  Dr. Kirke Corin completed successful angioplasty and two drug- eluting stent placement to  the distal as well as mid right coronary artery.  It was stated that the procedure was difficult secondary to tortuosity in the right coronary artery and a buddy wire was required to deal with the stent.  There was moderate disease in the left anterior descending artery and left circumflex artery.  Of note, no revascularization was recommended to the PDA and posterolateral branch. There felt to be not culprit in relatively distal location.  The patient's LV systolic function was preserved.  He did tolerate the procedure well.  He was taken to the CCU for further observation.  The patient's troponin did peak at greater than 100, but was on a downward trend with last troponin being noted at 79.03.  After revascularization, the patient's right radial site was without signs of hematoma.  He had no further complaints of chest pain and was doing well.  Low-dose ACE inhibitor as well as beta-blocker were added to the patient's medical regimen.  He tolerated this addition well.  The patient has had a history of intolerance to statins secondary to muscle aches.  He agreed to try Crestor  and therefore a low-dose Crestor was added.  He has tolerated this addition well and this will be continued as an outpatient.  The patient was also placed on Plavix post stent placement, this will be continued.  The patient was on Prevacid at home, so Plavix was changed to Protonix.  Of note, the patient does smoke approximately half a pack a day.  Smoking cessation had been advised. He has voiced readiness to quit and interested in using nicotine patches.  The patient has been given tips for quitting as well as encouraged to use quit now hotline.  On February 14, 2011, the patient did participate in cardiac rehab phase 1. He tolerated ambulation well without complaints of chest pain or shortness of breath.  His vital signs remained stable.  The patient is interested in outpatient cardiac rehab in [place], referral has been sent.  On day of discharge, Dr. [place] evaluated the patient and noted him stable for home with close outpatient followup.  Again, the patient's right radial site with signs of hematoma.  Discharge plans and instructions were discussed the patient, he voiced understanding.  Of note, the patient does have a sleep study scheduled for Mar 02, 2011, for sleep apnea.  DISCHARGE LABS:  Sodium 138, potassium 4, chloride 104, bicarb 24, BUN 16, creatinine 0.94.  DISCHARGE MEDICATIONS: 1. Plavix 75 mg 1 tablet daily. 2. Lisinopril 5 mg half tablet daily. 3. Metoprolol tartrate 25 mg 1 tablet twice daily. 4. Nicotine 14 mg/24-hour patch one patch transdermally daily. 5. Nitroglycerin 0.4 mg tablets sublingual one place under tongue at     onset of chest pain and may repeat every 5 minutes for up to three     doses. 6. Protonix 40 mg 1 tablet daily. 7. Crestor 5 mg daily. 8. Aspirin enteric-coated 81 mg 1 tablet daily. 9. Tramadol 50 mg 1 tablet every 8 hours as needed for pain. 10.Vitamin D over-the-counter 1 tablet daily. 11.Please stop taking  benazepril/hydrochlorothiazide. 12.Please stop taking Prevacid.  DISCHARGE PLAN AND INSTRUCTIONS: 1. The patient will follow up with Tereso Newcomer, physician assistant     for Dr. Tenny Craw on Mar 01, 2011, at 10 a.m. 2. The patient is to increase activity slowly.  He may shower, but no     bathing.  No lifting for 1 week greater than 5 pounds.  No sexual  activity for 1 week.  He can return to work after his post hospital     followup. 3. The patient is to keep his cath site clean and dry and call office     for any problems.  He is to avoid straining.  He     is to stop any activity that can cause chest pain and shortness of     breath. 4. The patient is to call the office in the interim for any questions     or concerns.  DURATION OF DISCHARGE:  Greater than 30 minutes with physician and physician extender time.     Leonette Monarch, PA-C   ______________________________ Chandi Nicklin M. [place], M.D.    NB/MEDQ  D:  02/15/2011  T:  02/15/2011  Job:  [id number]  cc:   Pricilla Riffle, MD, [facility] Georgina Quint. Plotnikov, MD  Electronically Signed by Alen Blew P.A. on 02/17/2011 12:30:46 PM Electronically Signed by Ceili Boshers [place] M.D. on 02/27/2011 12:38:44 PM

## 2011-03-01 ENCOUNTER — Other Ambulatory Visit: Payer: Self-pay

## 2011-03-01 ENCOUNTER — Other Ambulatory Visit (INDEPENDENT_AMBULATORY_CARE_PROVIDER_SITE_OTHER): Payer: 59 | Admitting: *Deleted

## 2011-03-01 ENCOUNTER — Ambulatory Visit (INDEPENDENT_AMBULATORY_CARE_PROVIDER_SITE_OTHER): Payer: 59 | Admitting: Physician Assistant

## 2011-03-01 ENCOUNTER — Encounter: Payer: Self-pay | Admitting: Physician Assistant

## 2011-03-01 DIAGNOSIS — I2510 Atherosclerotic heart disease of native coronary artery without angina pectoris: Secondary | ICD-10-CM

## 2011-03-01 DIAGNOSIS — R5381 Other malaise: Secondary | ICD-10-CM

## 2011-03-01 DIAGNOSIS — E538 Deficiency of other specified B group vitamins: Secondary | ICD-10-CM

## 2011-03-01 DIAGNOSIS — E7800 Pure hypercholesterolemia, unspecified: Secondary | ICD-10-CM

## 2011-03-01 DIAGNOSIS — I252 Old myocardial infarction: Secondary | ICD-10-CM

## 2011-03-01 DIAGNOSIS — R5383 Other fatigue: Secondary | ICD-10-CM

## 2011-03-01 DIAGNOSIS — I10 Essential (primary) hypertension: Secondary | ICD-10-CM

## 2011-03-01 LAB — BASIC METABOLIC PANEL
BUN: 17 mg/dL (ref 6–23)
Creatinine, Ser: 1 mg/dL (ref 0.4–1.5)
GFR: 85.27 mL/min (ref >60.00)
Potassium: 4.4 mEq/L (ref 3.5–5.1)

## 2011-03-01 LAB — CBC WITH DIFFERENTIAL/PLATELET
Eosinophils Relative: 1.9 % (ref 0.0–5.0)
Monocytes Relative: 7.6 % (ref 3.0–12.0)
Neutrophils Relative %: 50.5 % (ref 43.0–77.0)
Platelets: 208 10*3/uL (ref 150.0–400.0)
WBC: 7.7 10*3/uL (ref 4.5–10.5)

## 2011-03-01 LAB — VITAMIN B12: Vitamin B-12: 559 pg/mL (ref 211–911)

## 2011-03-01 LAB — TESTOSTERONE: Testosterone: 310.93 ng/dL — ABNORMAL LOW (ref 350.00–890.00)

## 2011-03-01 LAB — CK: Total CK: 186 U/L (ref 7–232)

## 2011-03-01 NOTE — Assessment & Plan Note (Signed)
Lisinopril is a new medication.  Check a basic metabolic panel to follow up on renal function and potassium.  Blood pressure is controlled.

## 2011-03-01 NOTE — Progress Notes (Signed)
History of Present Illness: Primary Cardiologist:  Dr. Dietrich Pates  [name] is a 60 y.o. male With a history of PAD, hypertension, hyperlipidemia and COPD who recently presented to [facility] on April 15 with chest pain.  EKG confirmed inferior ST elevation myocardial infarction.  He was taken directly to the cardiac catheterization lab where he had a mid 80% lesion and a distal occlusion in the RCA.  Both lesions were treated with a Promus drug-eluting stent.  He had moderate residual nonobstructive disease in the PDA and PLB that was treated medically and his EF is preserved at 55%.  He returns today for follow up.  Of note, Crestor was added back to his medical regimen to see if he could tolerate it.  Hosp Labs:  Na 138, K 4, Creat 0.94, Hgb 15.9, TC 181, TG 321, HDL 20, LDL 97, A1C 5.4 CXR from 3/11: bronchitic changes, o/w no acute changes  He is doing well.  He denies intercurrent symptoms reminiscent of his myocardial infarction.  He does describe occasional "twinges."  These seem to occur with eating.  He denies dysphagia, vomiting, melena, hematochezia.  He was switched from Prevacid to Protonix in the hospital given his Plavix therapy.  He denies any exertional symptoms.  He has quit smoking!  He denies orthopnea, PND or edema.  He denies syncope.  His right radial site is without pain or swelling.  Past Medical History  Diagnosis Date  . Hiatal hernia   . Duodenitis   . Renal cyst   . Bell's palsy   . Hypercholesteremia   . Diverticulosis   . Cyst     sebacceous  . Renal cell carcinoma   . Bronchitis   . Low back pain   . Hypertension   . Hyperthyroidism   . Vitamin B12 deficiency   . PVD (peripheral vascular disease)     s/p right iliofem. bypass  . Pancreatitis   . GERD (gastroesophageal reflux disease)   . COPD (chronic obstructive pulmonary disease)   . Colon polyps   . Anxiety   . Coronary artery disease     a. s/p INF STEMI 4/12: DES x 2 (mid and  dist RCA);  b. cath 4/12: oLAD 40%, mLAD 40%, pD1 30%, pOM1 40%, pOM3 50%, mRCA 80% (PCI), PDA 70% (med tx), PLB1 60%  (med tx); EF 55%  . Meralgia paresthetica     right  . Erectile dysfunction   . Chest pain, unspecified   . Sleep apnea     Current Outpatient Prescriptions  Medication Sig Dispense Refill  . aspirin 81 MG tablet Take 81 mg by mouth daily.        . Cholecalciferol (VITAMIN D3) 1000 UNITS CAPS Take 1 capsule by mouth daily.        Marland Kitchen Clopidogrel Bisulfate (PLAVIX PO) Take 75 mg by mouth daily.       . Coenzyme Q10 (CO Q-10) 200 MG CAPS Take 200 mg by mouth daily.        . CRESTOR 5 MG tablet Take 1 tablet by mouth Daily.      . hydrocortisone (ANUSOL-HC) 25 MG suppository prn      . lisinopril (PRINIVIL,ZESTRIL) 5 MG tablet Take 1 tablet by mouth Daily.      . metoprolol tartrate (LOPRESSOR) 25 MG tablet Take 1 tablet by mouth Twice daily.      Marland Kitchen NITROSTAT 0.4 MG SL tablet Take 1 tablet for chest pains every 5 minutes  up to 3 doses      . pantoprazole (PROTONIX) 40 MG tablet Take 1 tablet by mouth Daily.      . traMADol (ULTRAM) 50 MG tablet Take 1 to 2 tabs by mouth two times a day as needed       . triamcinolone (KENALOG) 0.5 % cream Apply topically 2 (two) times daily as needed.        . vitamin B-12 (CYANOCOBALAMIN) 1000 MCG tablet Take 1,000 mcg by mouth daily.        Marland Kitchen DISCONTD: Azilsartan Medoxomil (EDARBI) 80 MG TABS Take 1 tablet by mouth daily.        Marland Kitchen DISCONTD: benazepril-hydrochlorthiazide (LOTENSIN HCT) 20-25 MG per tablet Take 1 tablet by mouth daily.        Marland Kitchen DISCONTD: lansoprazole (PREVACID) 15 MG capsule Take 15 mg by mouth daily.        Marland Kitchen DISCONTD: Pitavastatin Calcium (LIVALO) 4 MG TABS Take 1 tablet by mouth daily.        Marland Kitchen DISCONTD: tadalafil (CIALIS) 20 MG tablet Take 1/2 to 1 tab by mouth every 1 to 3 days as needed       . DISCONTD: vardenafil (LEVITRA) 20 MG tablet Take 1/2 to 1 tab by mouth as needed         Allergies  Allergen Reactions  .  Pravastatin Sodium     REACTION: gasy, constipated  . Rosuvastatin     REACTION: myalgias  . Simvastatin     REACTION: myalgia    Vital Signs: BP 116/78  Pulse 58  Ht 5\' 10"  (1.778 m)  Wt 256 lb 12.8 oz (116.484 kg)  BMI 36.85 kg/m2  PHYSICAL EXAM: Well nourished, well developed, in no acute distress HEENT: normal Neck: no JVD Cardiac:  normal S1, S2; RRR; no murmur Lungs:  clear to auscultation bilaterally, no wheezing, rhonchi or rales Abd: soft, nontender, no hepatomegaly Ext: no edema; R radial site without hematoma or bruit Skin: warm and dry Neuro:  CNs 2-12 intact, no focal abnormalities noted  EKG:  Sinus bradycardia, heart rate 58, leftward axis, inferior Q waves with associated T-wave inversions-evolving inferior MI; No significant change when compared to prior tracing dated 02/14/11  ASSESSMENT AND PLAN:

## 2011-03-01 NOTE — Patient Instructions (Signed)
Your physician recommends that you schedule a follow-up appointment in: 6 weeks with Dr Tenny Craw  Your physician recommends that you return for lab work in: [facility] today and Lipid and Liver panels when you come in to see Dr Tenny Craw  Your physician has recommended you make the following change in your medication: increase Protonix to twice daily for 2 weeks then go back to once daily after that.  We will refer you to Cardiac Rehab at [facility]

## 2011-03-01 NOTE — Assessment & Plan Note (Signed)
He is trying to tolerate Crestor.  He's currently taking 5 mg one half tablet every other day.  Schedule follow up lipids and LFTs in 6-8 weeks.

## 2011-03-01 NOTE — Assessment & Plan Note (Signed)
Doing well post MI.  Continue Plavix and aspirin.  He has had some chest twinges.  These seem to be associated with meals.  He has not had any recurrent anginal symptoms.  I have asked him to increase his Protonix to twice a day for 2 weeks, then resume once daily.  He is interested in cardiac rehabilitation.  We will make that referral.  He may return to work at this time.  He will return to see Dr. Tenny Craw in 6 weeks.

## 2011-03-02 ENCOUNTER — Ambulatory Visit (HOSPITAL_BASED_OUTPATIENT_CLINIC_OR_DEPARTMENT_OTHER): Payer: 59 | Attending: Pulmonary Disease

## 2011-03-02 DIAGNOSIS — J4489 Other specified chronic obstructive pulmonary disease: Secondary | ICD-10-CM | POA: Insufficient documentation

## 2011-03-02 DIAGNOSIS — E039 Hypothyroidism, unspecified: Secondary | ICD-10-CM | POA: Insufficient documentation

## 2011-03-02 DIAGNOSIS — Z79899 Other long term (current) drug therapy: Secondary | ICD-10-CM | POA: Insufficient documentation

## 2011-03-02 DIAGNOSIS — I10 Essential (primary) hypertension: Secondary | ICD-10-CM | POA: Insufficient documentation

## 2011-03-02 DIAGNOSIS — G4733 Obstructive sleep apnea (adult) (pediatric): Secondary | ICD-10-CM | POA: Insufficient documentation

## 2011-03-02 DIAGNOSIS — J449 Chronic obstructive pulmonary disease, unspecified: Secondary | ICD-10-CM | POA: Insufficient documentation

## 2011-03-02 NOTE — Cardiovascular Report (Signed)
[name], [name]             ACCOUNT NO.:  [id number]  MEDICAL RECORD NO.:  [id number]           PATIENT TYPE:  I  LOCATION:  2902                         FACILITY:  [facility]  PHYSICIAN:  Lorine Bears, MD     DATE OF BIRTH:  [birth year]  DATE OF PROCEDURE: DATE OF DISCHARGE:                           CARDIAC CATHETERIZATION   PROCEDURES PERFORMED: 1. Left heart catheterization. 2. Coronary angiography. 3. Left ventricular angiography. 4. Right coronary artery angioplasty and two drug-eluting stent     placement to the distal as well as the midsegment.  ACCESS:  Right radial artery.  INDICATIONS/CLINICAL HISTORY:  This is a 60 year old gentleman with no previous cardiac history.  He does have history of peripheral arterial disease, status post right iliofemoral bypass, hypertension, and hyperlipidemia.  He presented with chest pain which started in the evening of April 14.  The chest pain continued and did not resolve for hours until his girlfriend convinced him to come to the emergency room. The patient has been having intermittent chest pain over the last 5 days, but was milder.  The patient was found to have ST elevation in the inferior leads.  A code STEMI was called.  Risks, benefits, and alternatives were discussed with the patient.  STUDY DETAILS:  A standard informed consent was obtained.  Right radial artery was prepped in a sterile fashion.  It was anesthetized with 1% lidocaine.  A 6-French sheath was placed in the right radial artery after an anterior puncture.  Verapamil 3 mg was given through the sheath.  The patient was already given 5000 units of fractionated heparin in the emergency room.  He was also already given aspirin 325 mg as well as Plavix 300 mg once daily.  In the cath lab, he was started on IV bivalirudin and was given another dose of Plavix 300 mg.  The patient did have tortuosity in the innominate artery which required some catheter  manipulation in order to engage the ascending aorta.  Left main artery was engaged with a JL-3.5 catheter.  I then went with a JR-4 guiding catheter to the right coronary artery for the angioplasty.  Left ventricular angiography was performed at the end of the procedure with a pigtail catheter in the RAO 30 position.  INTERVENTIONAL PROCEDURE NOTE:  A 6-French JR-4 guiding catheter was used.  ACT was therapeutic after given IV bivalirudin.  The right coronary artery was significantly tortuous.  I used [place] wire with some difficulty.  The lesion was predilated with an apex balloon 2.5 x 15 mm to 6 atmospheres, then to 10 atmospheres.  This established TIMI 3 flow in the vessel.  The lesion was noted in the mid segment at 80%. Initially, I tried to deliver a stent to the distal right coronary artery.  However, I could not pass the stent beyond the mid segment.  I chose to use a buddy wire with an intubation wire.  Still, I could not pass the stent.  Thus, the lesion in the mid segment was predilated with 2.5 x 15 apex balloon to 12 atmospheres.  After that, I was able to pass a 3.0  x 24-mm Promus Element stent to the distal right coronary artery and deployed at 16 atmospheres.  This was postdilated with an [place] Quantum 3.5 x 20 mm to 16 atmospheres in the proximal and mid segment.  I then tried to place another stent in the mid RCA, but could not pass it. Thus, I sent the wire again as a buddy wire with intubation, which was removed after deploying the distal stent.  I was able to pass Promus Element stent 3.5 x 20 mm to the mid RCA and deployed it at 14 atmospheres.  This was postdilated in the mid segment with an [place] apex balloon 4.0 x 15 mm to 16 atmospheres.  Angiography showed excellent results and TIMI 3 flow.  ST elevation improved significantly.  At the end of the procedure, the guiding catheter was removed over the wire.  A TR band was applied.  There was no immediate  complications.  STUDY FINDINGS:  Hemodynamic findings:  Left ventricular pressure was 125/12 with a left ventricular end-diastolic pressure of 22 mmHg. Aortic pressure is 123/70 with a mean pressure of 91 mmHg.  Left ventricular angiography:  This showed normal LV systolic function with an estimated ejection fraction of 55%.  There was borderline inferior wall hypokinesis.  Coronary angiography: Right coronary artery:  The vessel was large in size and dominant.  It was significantly tortuous in the proximal and mid segment.  The vessel was noted to be occluded distally with TIMI 0 flow.  In the mid segment, there is also an 80% tubular lesion.  After opening the right coronary artery, it was noted that there was 70% lesion in the mid right PDA which was a normal-size branch.  There was also 60% stenosis in the mid first posterolateral branch.  Angioplasty was not performed in the distal lesions as these were felt to be nonculprit and did not affect the outflow.  Left main coronary artery:  The vessel was normal in size and free of significant disease.  Left anterior descending artery:  The vessel was normal in size and mildly calcified.  There was 40% ostial stenosis.  The mid segment of the LAD is mildly to moderately calcified with diffuse 40% stenosis in the mid segment.  First diagonal branch is a normal-sized vessel with 30% proximal stenosis.  Second diagonal is small in size and free of significant disease.  Third diagonal is very small in size.  Left circumflex artery:  The vessel was normal in size and nondominant. There is a high OM1 which is mostly in a ramus distribution.  This has 40% proximal stenosis.  OM2 is normal size vessel and free of significant disease.  OM3 is medium-sized branch with a tubular 50% stenosis proximally.  STUDY CONCLUSION: 1. Acute inferior ST elevation myocardial infarction due to distal     right coronary artery occlusion with another 80%  stenosis in the     mid segment in a tortuous vessel. 2. Moderate disease in the left anterior descending artery and left     circumflex arteries. 3. Normal LV systolic function. 4. Successful angioplasty and two drug-eluting stent placement to the     distal as well as mid right coronary artery. 5. This was difficult procedure due to tortuosity in the innominate     artery as well as significant tortuosity in the right coronary     artery, which required a buddy wire due to difficulty in delivering     stents.  RECOMMENDATIONS: 1. Aspirin  daily indefinitely. 2. Plavix 75 mg once daily for at least 12 months. 3. Smoking cessation. 4. Aggressive treatment of risk factors. 5. No revascularization is recommended to the PDA and PL branch due to     relatively distal location.     Lorine Bears, MD     MA/MEDQ  D:  02/13/2011  T:  02/13/2011  Job:  [id number]  Electronically Signed by Lorine Bears MD on 03/02/2011 03:21:05 PM

## 2011-03-14 ENCOUNTER — Encounter (HOSPITAL_COMMUNITY)
Admission: RE | Admit: 2011-03-14 | Discharge: 2011-03-14 | Disposition: A | Discharge status: Home / Self Care | Payer: 59 | Source: Ambulatory Visit | Attending: Internal Medicine | Admitting: Internal Medicine

## 2011-03-14 DIAGNOSIS — I2119 ST elevation (STEMI) myocardial infarction involving other coronary artery of inferior wall: Secondary | ICD-10-CM | POA: Insufficient documentation

## 2011-03-14 DIAGNOSIS — F17200 Nicotine dependence, unspecified, uncomplicated: Secondary | ICD-10-CM | POA: Insufficient documentation

## 2011-03-14 DIAGNOSIS — Z5189 Encounter for other specified aftercare: Secondary | ICD-10-CM | POA: Insufficient documentation

## 2011-03-14 DIAGNOSIS — G4730 Sleep apnea, unspecified: Secondary | ICD-10-CM | POA: Insufficient documentation

## 2011-03-14 DIAGNOSIS — Z79899 Other long term (current) drug therapy: Secondary | ICD-10-CM | POA: Insufficient documentation

## 2011-03-14 DIAGNOSIS — E785 Hyperlipidemia, unspecified: Secondary | ICD-10-CM | POA: Insufficient documentation

## 2011-03-14 DIAGNOSIS — I10 Essential (primary) hypertension: Secondary | ICD-10-CM | POA: Insufficient documentation

## 2011-03-14 DIAGNOSIS — J449 Chronic obstructive pulmonary disease, unspecified: Secondary | ICD-10-CM | POA: Insufficient documentation

## 2011-03-14 DIAGNOSIS — I2510 Atherosclerotic heart disease of native coronary artery without angina pectoris: Secondary | ICD-10-CM | POA: Insufficient documentation

## 2011-03-14 DIAGNOSIS — Z7902 Long term (current) use of antithrombotics/antiplatelets: Secondary | ICD-10-CM | POA: Insufficient documentation

## 2011-03-14 DIAGNOSIS — I70209 Unspecified atherosclerosis of native arteries of extremities, unspecified extremity: Secondary | ICD-10-CM | POA: Insufficient documentation

## 2011-03-14 DIAGNOSIS — J4489 Other specified chronic obstructive pulmonary disease: Secondary | ICD-10-CM | POA: Insufficient documentation

## 2011-03-14 DIAGNOSIS — Z9861 Coronary angioplasty status: Secondary | ICD-10-CM | POA: Insufficient documentation

## 2011-03-14 DIAGNOSIS — Z7982 Long term (current) use of aspirin: Secondary | ICD-10-CM | POA: Insufficient documentation

## 2011-03-15 ENCOUNTER — Telehealth: Payer: Self-pay | Admitting: Pulmonary Disease

## 2011-03-15 DIAGNOSIS — G4733 Obstructive sleep apnea (adult) (pediatric): Secondary | ICD-10-CM

## 2011-03-15 NOTE — Procedures (Addendum)
NAME:  [name], [name] NO.:  [id number]  MEDICAL RECORD NO.:  [id number]          PATIENT TYPE:  OUT  LOCATION:  SLEEP CENTER                 FACILITY:  [facility]  PHYSICIAN:  Coralyn Helling, MD        DATE OF BIRTH:  09/26/1951  DATE OF STUDY:  03/15/2011                           NOCTURNAL POLYSOMNOGRAM  REFERRING PHYSICIAN:  Coralyn Helling, MD  INDICATION FOR STUDY:  [name] is a 60 year old male who has a history of COPD, hypertension, hypothyroidism, and anxiety.  He also has symptoms of sleep disruption, snoring, and daytime sleepiness.  He is therefore referred to sleep lab for evaluation of hypersomnia with obstructive sleep apnea.  Height is 5 feet 10 inches, weight is 250 pounds, BMI is 36, and neck size 18.5 inches.  MEDICATIONS:  Co-Enzyme Q-10, Plavix, metoprolol, lisinopril, pantoprazole, Crestor, aspirin, and vitamin D.  EPWORTH SLEEPINESS SCORE:  Epworth score is 10.  SLEEP ARCHITECTURE:  The patient followed a split night study protocol. During the diagnostic portion of the study, total recording time was 141 minutes.  Total sleep time was 119 minutes.  Sleep efficiency was 85%. Sleep latency was 10 minutes.  This portion of the study was notable for the lack of stage III sleep and REM sleep.  The patient slept exclusively in the nonsupine position.  During the titration portion of study, total recording time was 246 minutes.  Total sleep time was 219 minutes.  Sleep efficiency was 89%. Sleep latency was 0.5 minutes.  REM latency was 42 minutes.  This portion of the study was notable for lack of stage III sleep.  The patient slept predominately in the nonsupine position.  RESPIRATORY DATA:  The average respiratory rate was 18.  Loud snoring was noted by the technician.  During the diagnostic portion of the study, the overall apnea-hypopnea index was 28.6.  The events were exclusively obstructive in nature.  During the titration portion of the  study, the patient was started on CPAP at 5-cm water and increased to 10-cm water.  With CPAP set at 10 cm of water, the apnea-hypopnea index was reduced to zero.  At this pressure setting, the patient was observed in REM sleep and supine sleep.  OXYGEN DATA:  The baseline oxygenation was 96%.  The oxygen saturation nadir was 88%.  The study was conducted without the use of supplemental oxygen.  CARDIAC DATA:  The average heart rate was 58 and the rhythm strip showed normal sinus rhythm.  MOVEMENT-PARASOMNIA:  The patient had one restroom trip.  During the diagnostic portion of the study, the periodic limb movement index was 32.  During the titration portion of the study, the periodic limb movement index was zero.  IMPRESSION-RECOMMENDATIONS:  This study shows evidence for moderate obstructive sleep apnea.  The patient had good control of sleep disordered breathing with CPAP set at 10 cm of water.  At this pressure setting, he was observed in REM sleep and supine sleep.  In addition to diet, exercise, and weight reduction, I would recommend the patient to be started on CPAP at 10 cm of water and monitored for his clinical response.     Coralyn Helling, MD  Diplomat, Biomedical engineer of Sleep Medicine Electronically Signed    VS/MEDQ  D:  03/15/2011 10:45:28  T:  03/15/2011 23:32:45  Job:  [id number]

## 2011-03-15 NOTE — Assessment & Plan Note (Signed)
[facility]                            CARDIOLOGY OFFICE NOTE   NAME:[name], [name] BELUE                    MRN:          [id number]  DATE:08/06/2007                            DOB:          [birth year]    IDENTIFICATION:  [name] is a 60 year old gentleman who was  previously followed by Dr. Lewayne [name] in clinic.  He was last seen in  2007.  The patient is also followed by Dr. Trinna Post [name].   Patient comes in on return today, mostly because he has planned to have  renal surgery up in [place]; therefore, for preop risk stratification.   The patient is not too active, but he complains of bilateral chest pain,  kind of a band-like sensation that can occur with and without activity,  no real change with activity.  Does have also an intermittent chest pain  that can be sharp that radiates from his anterior chest to the back,  short-lived.  Also has a right-sided discomfort that comes on more with  meals.   The biggest activity is golf.  He uses a cart, though.   Current medications include Benazepril, hydrochlorothiazide 20/25 daily,  Prevacid daily, aspirin p.r.n., Lovoza 2 b.i.d., just started.  Note,  simvastatin was stopped.   PHYSICAL EXAMINATION:  Patient is in no distress.  Blood pressure is 118/80, pulse 80 and regular.  Weight 241, stable from  2007.  NECK:  JVP is normal.  No bruits.  LUNGS:  Clear without rales or rhonchi.  CARDIAC:  Regular rate and rhythm.  S1 and S2.  No S3.  No significant  murmurs.  ABDOMEN:  Supple.  Nontender.  No hepatomegaly.  EXTREMITIES:  No edema.   IMPRESSION:  1. Chest pain.  I am not sure what he represents.  He continues to      smoke a pack and a half per day, and I have counseled him on this.      He has risk factors, indeed, because he has known peripheral      vascular disease (status post right lower extremity bypass      surgery).  I would recommend scheduling an Adenosine Myoview.  He   has had these in the past and follow.  He has a history of sluggish      gallbladder which may be exacerbating the pain.  Also, he has had      pancreatitis.  We will check some labs today.  2. Dyslipidemia:  I really think he should be on a statin.  I am not      sure why he stopped the simvastatin.  He is on Lovoza until after      the surgery.  I would follow and keep on this for now.  3. History of gastroesophageal reflux.  4. History of hypertension:  Adequate control.  5. Peripheral vascular disease, as noted.  6. Chronic obstructive pulmonary disease.  7. I will set to see the patient back in the spring after his surgery.     [name] Riffle, MD, [facility]  Electronically Signed    PVR/MedQ  DD: 08/06/2007  DT: 08/06/2007  Job #: [id number]

## 2011-03-15 NOTE — Assessment & Plan Note (Signed)
[facility]                            CARDIOLOGY OFFICE NOTE   NAME:PROCOPIOAlexavier, Tsutsui                    MRN:          [id number]  DATE:03/10/2008                            DOB:          Jun 02, 1951    IDENTIFICATION:  Mr. Knope is a 60 year old gentleman whom I see in  cardiology clinic.  He has a history of dyslipidemia, peripheral  vascular disease, hypertension.  He was last seen in October.   When I last saw him, he was being evaluated for renal surgery and  actually had a Myoview scan done as a preop risk stratification, and  this showed no evidence of ischemia.   The patient went [place] and had his renal surgery done. Indeed, the  tumor that was removed was cancerous.   Note:  In March, he was flying to [place] for a visit. On the plane, he  developed chest pain, initially on the right side of his chest and  radiating to the left. He got through the weekend with this, and his  blood pressure was 170/130.  He went to the local ER. There it was  160/130, and again he had the chest pain.  It was on and off during his  stay this weekend. He was admitted, ruled out for MI, underwent a stress  test that he was told was okay.   Since returning, he has continued to have chest pain.  He says it comes  and goes, again right side, left side, to the middle. It is sharp, and  it can go away.  He has been seen by Dr. Trinna Post Plotnikov.  A CT of the  chest showed only a nodule of the thyroid. He has been seen in GI or Dr.  Arlyce Dice who felt that the pain was more muscular.  No endoscopy was done.   CURRENT MEDICATIONS:  1. Benazepril/hydrochlorothiazide 20/25.  2. Aspirin 81 p.r.n.  3. Question Lovaza 2 b.i.d.  4. Aciphex daily.  5. Calcium with D daily.   REVIEW OF SYSTEMS:  The patient had taken Crestor but felt achy, taking  every other day at low dose.   PHYSICAL EXAMINATION:  GENERAL:  The patient is in no distress.  VITAL SIGNS:  Blood  pressure of 111/73, pulse 76 and regular, weight  247.  LUNGS:  Clear.  CHEST:  No significant tenderness.  ABDOMEN:  Benign, obese.  EXTREMITIES:  No edema.   A 12-lead EKG showed normal sinus rhythm, 77 beats per minute.   IMPRESSION:  1. Chest pain.  I am not convinced his chest pain is cardiac either.      It has been too longstanding.  It is not exertional.  May indeed be      more muscular and take time to resolve. Again, I would continue on      current medicines for now.  He should stay on aspirin.  2. Dyslipidemia.  Last lipid panel was not good.  LDL was 130. HDL was      25, total cholesterol 210. We talked about dietary modification and  weight loss as well exercise. I would try a statin and place him      on Lipitor 5 mg, increasing to 10.  If he has any problems with      this, call.  I may switch him to Pravachol.  Again, he has not      tolerated simvastatin or Crestor.   Otherwise I will set followup in the fall, but I will be in touch with  him.  If he does tolerate the Lipitor, then I would check a fasting  lipid panel in 8 weeks time.  Most likely we will need another agent  which is Niaspan, but will address one thing at a time.     Pricilla Riffle, MD, [facility]  Electronically Signed    PVR/MedQ  DD: 03/10/2008  DT: 03/10/2008  Job #: [id number]

## 2011-03-15 NOTE — Telephone Encounter (Signed)
Per Dr. Craige Cotta, sleep study results are available, pt will need OV to discuss.  OV scheduled for 03/23/11 at 1:30pm with VS -- pt aware and ok with this appt.

## 2011-03-15 NOTE — Assessment & Plan Note (Signed)
[facility]                            CARDIOLOGY OFFICE NOTE   NAME:PROCOPIOMeiko, [name]                    MRN:          [id number]  DATE:09/15/2008                            DOB:          1950-12-08    IDENTIFICATION:  [name] is a 60 year old gentleman with known  coronary artery disease, known cerebrovascular disease, and also history  of dyslipidemia.  I last saw him in May 2009.   In the interval, he has been seen at [facility] in [place] for his renal  cancer by report.  The skin looks like it shows no recurrence.   The patient has also been seen by Dr. Macarthur Critchley Plotnikov and then in GI.  He was having some chest pressure.  He thought it was due to the  Aciphex, he stopped this and pressure resolved.   Of note, the patient had stopped his Lipitor.  He said he just did not  feel well on it.   CURRENT MEDICINES:  Now include:  1. Benazepril HCTZ 20/25 (he has been out of this since Friday).  2. Aspirin 81 mg.  3. Lovaza 2 b.i.d.  4. Calcium with D.  5. Prilosec OTC.   PHYSICAL EXAMINATION:  GENERAL:  The patient is in no distress.  VITAL SIGNS:  Blood pressure is 152/91 (on Friday, it was 126/78), pulse  is 78 and regular, and weight not taken.  NECK:  JVP is normal.  LUNGS:  Clear.  CARDIAC:  Regular rate and rhythm.  S1 and S2.  No S3.  No significant  murmurs.  ABDOMEN:  Obese and benign.  EXTREMITIES:  No edema.   Carotid Dopplers done today showed mild stable disease of the ICA (0-  39%).   A 12-lead EKG, normal sinus rhythm.  Nonspecific T-wave changes.   IMPRESSION:  1. Coronary artery disease.  I am not convinced there are signs of      active angina.  He had some chest tightness which resolved.  I did      not see a clear-cut association with exercise.  Note a Myoview scan      from a year ago with low risk.  I would continue to follow as his      symptoms declare.  2. Peripheral vascular disease.  Again, as noted, it  seems to be      stable.  3. Dyslipidemia.  I have asked him to try Pravachol 10.  Follow up      LipoMed in 8 weeks' time; if he has problems with this to call.  He      has not tolerated Lipitor, simvastatin, or Crestor.  4. Health care maintenance.  I encouraged him to walk.  I have also      encouraged him to cut back on his smoking.   I will set to see him back in the spring, but I will be in touch with  him regarding the blood work.     Pricilla Riffle, MD, [facility]  Electronically Signed    PVR/MedQ  DD: 09/15/2008  DT: 09/16/2008  Job #: [id number]  cc:   Georgina Quint. Plotnikov, MD

## 2011-03-15 NOTE — Assessment & Plan Note (Signed)
[facility]                         GASTROENTEROLOGY OFFICE NOTE   NAME:PROCOPIOEmanuel, Dowson                    MRN:          [id number]  DATE:10/10/2007                            DOB:          1951/03/16    PROBLEM:  Abdominal pain.   REASON FOR VISIT:  Mr. Vanatta has returned for reevaluation.  He is  still complaining of midepigastric pain.  Pain may radiate to both the  left and right subcostal areas.  It is unrelated to eating.  It may also  radiate to his back.  It can last up to 10 seconds at a time.  It  usually occurs spontaneously.  Occasionally, he has noted pain in the  right periumbilical area that radiates to the midepigastrium.  Upper  endoscopy performed for similar symptoms in October 2007 was  unrevealing, except for mild duodenitis.  He is no longer on  nonsteroidals.  He remains on Prevacid.  He denies pyrosis or dysphagia.  He has a known decreased gallbladder ejection fraction, though no  gallstones.  He recently underwent resection of a kidney tumor.   OTHER MEDICATIONS:  Benazepril/HCTZ and Lovaza.   PHYSICAL EXAMINATION:  VITAL SIGNS:  Pulse 92, blood pressure 122/82,  weight 239.  HEENT: EOMI.  PERRLA.  Sclerae are anicteric.  Conjunctivae are pink.  NECK:  Supple without thyromegaly, adenopathy or carotid bruits.  CHEST:  Clear to auscultation and percussion without adventitious  sounds.  CARDIAC:  Regular rhythm; normal S1 S2.  There are no murmurs, gallops  or rubs.  ABDOMEN:  There is mild subxiphoid and right upper quadrant tenderness  to palpation, without guarding or rebound.  There is also very minimal  right periumbilical tenderness.  There are no abdominal masses or  organomegaly.  EXTREMITIES:  Full range of motion.  No cyanosis, clubbing or edema.  RECTAL:  Deferred.   IMPRESSION:  Nonspecific abdominal pain.  This could be musculoskeletal  pain.  I think it is unlikely it is related to biliary tract  disease.  Dyspepsia is also less likely.   RECOMMENDATION:  No further GI workup at this time.  Symptoms are rather  minimal.  I carefully instructed Mr. Girgenti to contact me should he  have more frequent or worsening abdominal pain.     Barbette Hair. Arlyce Dice, MD,FACG  Electronically Signed    RDK/MedQ  DD: 10/10/2007  DT: 10/11/2007  Job #: [id number]

## 2011-03-15 NOTE — Assessment & Plan Note (Signed)
[facility]                           PRIMARY CARE OFFICE NOTE   NAME:[name], [name]                    MRN:          [id number]  DATE:06/28/2007                            DOB:          Apr 23, 1951    REFERRING PHYSICIAN:  Salzhauer   INTERNAL MEDICINE CONSULTATION   REASON FOR CONSULTATION:  Preoperative clearance for left kidney upper  pole lesion cryosurgery procedure on October 22.   HISTORY:  The patient is a 60 year old male with chronic vascular  problems who presents for per-op clearance.  Besides his usual concerns,  he has been having some chest pain going across the chest, mostly  superficial, sometimes will go into the right shoulder.  Non-exertional.  Mostly, he is concerned about lancinating left-sided headaches.  They  may happen throughout the day lasting several seconds.  This has been  going on for a couple months.  He was thinking of the possibility of  sinus infection.   PAST MEDICAL HISTORY:  Peripheral vascular disease with right  iliofemoral bypass in 2005.  Tobacco abuse.  Dyslipidemia.  GERD.  History of colon polyps.  History of diverticulosis.   CURRENT MEDICATIONS:  1. Plavix.  2. Benazepril hydrochlorothiazide 20/25 daily.  3. Prevacid 30 mg daily.  4. Chantix off and on with little success.  5. Aspirin daily.  6. Simvastatin daily.  7. Sucralfate p.r.n.  8. Darvocet-N 100 q.i.d. p.r.n.  9. Flonase p.r.n.   SOCIAL HISTORY:  As above, he continues to smoke.  He has been working.   FAMILY HISTORY:  Positive for heart disease.   REVIEW OF SYSTEMS:  Chest pain and headaches, as described above.  No  syncope.  No neurologic concerns otherwise.  The rest of the 18-point  review of systems is negative.   PHYSICAL:  Blood pressure 139/94, pulse 84, temperature 98.9, weight 240  pounds.  He is in no acute distress.  HEENT:  With most mucosa.  No erythema of the throat.  NECK:  Supple.  No thyromegaly or  bruit.  LUNGS:  Clear.  No wheeze or rales.  HEART:  S1, S2.  No gallops.  ABDOMEN:  Soft and nontender.  No pulsating masses.  No hernias felt.  LOWER EXTREMITIES:  Without edema.  He is alert and cooperative.  Denies  being depressed.  Head palpation is negative.  SKIN:  Clear.   LABORATORY STUDIES:  Cardiolite stress test was done in January 2007 and  was normal.  EKG today looks normal.  Chest x-ray on Mar 15, 2006 was  normal.   ASSESSMENT AND PLAN:  1. Preoperative clearance.  He is planning to have left kidney upper      pole lesion cryosurgery on October 22 in [place].  Should be clear      for surgery with a prior workup (see below).  2. Chest pain.  Will obtain cardiology consult with Dr. Tenny Craw.  I      wonder if it is time for him to have a heart catheterization done.  3. Headache, lancinating, possible trigeminal neuralgia.  Will try  Tegretol 100 mg p.o. b.i.d.  Risks and benefits discussed.  Obtain      MRI with contrast to rule out other abnormalities.  4. Chronic obstructive pulmonary disease of unknown degree.  Will      order chest x-ray.  5. Hypertension, controlled.  6. Peripheral vascular disease.  7. Ongoing tobacco abuse.     Georgina Quint. Plotnikov, MD  Electronically Signed    AVP/MedQ  DD: 06/28/2007  DT: 06/29/2007  Job #: [id number]   cc:   Amador Cunas, MD  Pricilla Riffle, MD, [facility]

## 2011-03-16 ENCOUNTER — Other Ambulatory Visit: Payer: Self-pay | Admitting: Internal Medicine

## 2011-03-16 ENCOUNTER — Other Ambulatory Visit: Payer: Self-pay

## 2011-03-16 ENCOUNTER — Encounter (HOSPITAL_COMMUNITY): Payer: 59

## 2011-03-16 DIAGNOSIS — E538 Deficiency of other specified B group vitamins: Secondary | ICD-10-CM

## 2011-03-16 DIAGNOSIS — R5381 Other malaise: Secondary | ICD-10-CM

## 2011-03-16 DIAGNOSIS — E7800 Pure hypercholesterolemia, unspecified: Secondary | ICD-10-CM

## 2011-03-17 ENCOUNTER — Encounter: Payer: Self-pay | Admitting: Pulmonary Disease

## 2011-03-18 ENCOUNTER — Encounter (HOSPITAL_COMMUNITY): Payer: 59

## 2011-03-18 NOTE — Assessment & Plan Note (Signed)
[facility]                           GASTROENTEROLOGY OFFICE NOTE   NAME:[name], [name]                    MRN:          [id number]  DATE:07/17/2006                            DOB:          [birth year]    PROBLEM:  Abdominal pain.   REASON:  [name] has returned for reevaluation.  Approximately a month  ago he was complaining of moderately severe upper abdominal pain.  Pain  would occasionally radiate to his back.  The evaluation by Dr. Posey Rea  included lab work, which demonstrated an AST of 75 and an ALT of 86, amylase  161 and lipase of 149 on June 15, 2006.  At the time he was taking his 6-8  Motrin a day for back pain.  He has been on Prevacid all along.  He  underwent a CT scan in [place], [place] that demonstrated some simple  renal cysts.  No pancreatic abnormalities were seen.  A HIDA scan by his  report showed an ejection fraction of 15%.  He did not have an ultrasound.  He drinks intermittently.  In 2006 he underwent colonoscopy that  demonstrated a diminutive polyp and diverticulosis.  Upper endoscopy in June  of 2006 demonstrated a hiatal hernia.   MEDICATIONS:  Include benazepril/HCTZ, Prevacid, simvastatin, and  sucralfate.   HE HAS NO KNOWN ALLERGIES.   PHYSICAL EXAM:  VITAL SIGNS:  Pulse 80, blood pressure 130/74, weight 224.   PHYSICAL EXAMINATION:  HEENT: EOMI. PERRLA. Sclerae are anicteric.  Conjunctivae are pink.  NECK:  Supple without thyromegaly, adenopathy or carotid bruits.  CHEST:  Clear to auscultation and percussion without adventitious sounds.  CARDIAC:  Regular rhythm; normal S1 S2.  There are no murmurs, gallops or  rubs.  ABDOMEN:  Bowel sounds are normoactive.  Abdomen is soft, non-tender and non-  distended.  There are no abdominal masses, tenderness, splenic enlargement  or hepatomegaly.  EXTREMITIES:  Full range of motion.  No cyanosis, clubbing or edema.  RECTAL:  Deferred.   IMPRESSION:   Abdominal pain with elevated amylase and lipase, consistent  with acute pancreatitis.  Etiology for his pancreatitis may include a  posterior penetrating ulcer, medications (Motrin), or possible biliary tract  disease.  There is no evidence for pancreatic neoplasm by CT scan.  He could  have microlithiasis, biliary sludge, or frank gall stones, though these were  not described by CT scan.  His decreased gallbladder ejection fraction would  not explain his pancreatitis.  Finally, he may have had active peptic  disease, including a posterior penetrating ulcer, in light of his NSAID use.   RECOMMENDATION:  1. Repeat LFTs, amylase, and lipase.  2. Upper endoscopy.  3. Consider ultrasound if the above tests are not revealing and he has      recurrent abdominal pain.                                   Barbette Hair. Arlyce Dice, MD,FACG   RDK/MedQ  DD:  07/17/2006  DT:  07/17/2006  Job #:  [id number] 

## 2011-03-21 ENCOUNTER — Encounter (HOSPITAL_COMMUNITY): Payer: 59

## 2011-03-22 ENCOUNTER — Encounter: Payer: Self-pay | Admitting: Internal Medicine

## 2011-03-23 ENCOUNTER — Ambulatory Visit (INDEPENDENT_AMBULATORY_CARE_PROVIDER_SITE_OTHER): Payer: 59 | Admitting: Internal Medicine

## 2011-03-23 ENCOUNTER — Encounter: Payer: Self-pay | Admitting: Internal Medicine

## 2011-03-23 ENCOUNTER — Encounter: Payer: Self-pay | Admitting: Pulmonary Disease

## 2011-03-23 ENCOUNTER — Encounter (HOSPITAL_COMMUNITY): Payer: 59

## 2011-03-23 ENCOUNTER — Ambulatory Visit (INDEPENDENT_AMBULATORY_CARE_PROVIDER_SITE_OTHER): Payer: 59 | Admitting: Pulmonary Disease

## 2011-03-23 VITALS — BP 104/60 | HR 67 | Temp 98.1°F | Ht 70.0 in | Wt 260.0 lb

## 2011-03-23 DIAGNOSIS — E291 Testicular hypofunction: Secondary | ICD-10-CM

## 2011-03-23 DIAGNOSIS — I252 Old myocardial infarction: Secondary | ICD-10-CM

## 2011-03-23 DIAGNOSIS — R5381 Other malaise: Secondary | ICD-10-CM

## 2011-03-23 DIAGNOSIS — G4733 Obstructive sleep apnea (adult) (pediatric): Secondary | ICD-10-CM

## 2011-03-23 NOTE — Assessment & Plan Note (Signed)
Not on Rx 

## 2011-03-23 NOTE — Assessment & Plan Note (Signed)
I have reviewed his sleep test results with the patient.  Explained how sleep apnea can affect the patient's health.  Driving precautions and importance of weight loss were discussed.  Treatment options for sleep apnea were reviewed.  Will proceed with CPAP at 10 cm H2O.

## 2011-03-23 NOTE — Assessment & Plan Note (Signed)
On CPAP. ?

## 2011-03-23 NOTE — Patient Instructions (Signed)
[email or web address] 

## 2011-03-23 NOTE — Assessment & Plan Note (Signed)
On Rx - better 

## 2011-03-23 NOTE — Patient Instructions (Signed)
Will arrange for CPAP set up  Follow up in 3 months 

## 2011-03-23 NOTE — Progress Notes (Signed)
Subjective:    Patient ID: [name], male    DOB: [birth year], 60 y.o.   MRN: [id number]  HPI 60 yo male with sleep apnea.  He is here to follow up his sleep test from 03/02/11>>AHI 28.6, SpO2 low 88%, CPAP 10 cm H2O.  Since his last visit he was hospitalized for acute coronary syndrome.  Past Medical History  Diagnosis Date  . Hiatal hernia   . Duodenitis   . Renal cyst   . Bell's palsy   . Hypercholesteremia   . Diverticulosis   . Cyst     sebacceous  . Renal cell carcinoma   . Bronchitis   . Low back pain   . Hypertension   . Hyperthyroidism   . Vitamin B12 deficiency   . PVD (peripheral vascular disease)     s/p right iliofem. bypass  . Pancreatitis   . GERD (gastroesophageal reflux disease)   . COPD (chronic obstructive pulmonary disease)   . Colon polyps   . Anxiety   . Coronary artery disease     a. s/p INF STEMI 4/12: DES x 2 (mid and dist RCA);  b. cath 4/12: oLAD 40%, mLAD 40%, pD1 30%, pOM1 40%, pOM3 50%, mRCA 80% (PCI), PDA 70% (med tx), PLB1 60%  (med tx); EF 55%  . Meralgia paresthetica     right  . Erectile dysfunction   . Chest pain, unspecified   . OSA (obstructive sleep apnea)     PSG 03/02/11>>AHI 28.6, SpO2 low 88%, CPAP 10 cm H2O.  Marland Kitchen Myocardial infarction 4/12    2 stents     Family History  Problem Relation Age of Onset  . Colon cancer Paternal Grandmother   . Cancer Paternal Grandmother     colon  . Hypertension    . Stroke Father   . Hypertension Mother      History   Social History  . Marital Status: Divorced    Spouse Name: N/A    Number of Children: N/A  . Years of Education: N/A   Occupational History  . business Production designer, theatre/television/film     [employer]   Social History Main Topics  . Smoking status: Former Smoker -- 1.0 packs/day for 40 years    Types: Cigarettes    Quit date: 02/15/2011  . Smokeless tobacco: Never Used   Comment: quit smoking in 2010 restarted 2011 1 ppd  . Alcohol Use: Yes  . Drug Use: No  . Sexually Active:  Yes   Other Topics Concern  . Not on file   Social History Narrative  . No narrative on file     Allergies  Allergen Reactions  . Pravastatin Sodium     REACTION: gasy, constipated  . Rosuvastatin     REACTION: myalgias  . Simvastatin     REACTION: myalgia     Outpatient Prescriptions Prior to Visit  Medication Sig Dispense Refill  . aspirin 81 MG tablet Take 81 mg by mouth daily.        . Cholecalciferol (VITAMIN D3) 1000 UNITS CAPS Take 1 capsule by mouth daily.        Marland Kitchen Clopidogrel Bisulfate (PLAVIX PO) Take 75 mg by mouth daily.       . Coenzyme Q10 (CO Q-10) 200 MG CAPS Take 200 mg by mouth daily.        . CRESTOR 5 MG tablet Take 0.5 tablets by mouth every other day.       . hydrocortisone (ANUSOL-HC) 25 MG suppository  prn      . lisinopril (PRINIVIL,ZESTRIL) 5 MG tablet Take 1 tablet by mouth Daily.      . metoprolol tartrate (LOPRESSOR) 25 MG tablet Take 1 tablet by mouth Twice daily.      Marland Kitchen NITROSTAT 0.4 MG SL tablet Take 1 tablet for chest pains every 5 minutes up to 3 doses      . pantoprazole (PROTONIX) 40 MG tablet Take 1 tablet by mouth Daily.      . traMADol (ULTRAM) 50 MG tablet Take 1 to 2 tabs by mouth two times a day as needed       . triamcinolone (KENALOG) 0.5 % cream Apply topically 2 (two) times daily as needed.        . vitamin B-12 (CYANOCOBALAMIN) 1000 MCG tablet Take 1,000 mcg by mouth daily.           Review of Systems     Objective:   Physical Exam Filed Vitals:   03/23/11 1341  BP: 104/60  Pulse: 67  Temp: 98.1 F (36.7 C)  TempSrc: Oral  Height: 5\' 10"  (1.778 m)  Weight: 260 lb (117.935 kg)  SpO2: 96%   General - Obese, no distress  HEENT - PERRLA, EOMI, no sinus tenderness, clear sinus drainage, MP 4, elongated uvula, enlarged tongue, no exudate, no LAN, no thyromegaly  Cardiovascular - S1S2 regular, no murmur, peripheral pulses symmetric  Chest - no wheezing or rales, no dullness  Abd - Soft, nontender, no masses, normal bowel  sounds  Ext - minimal ankle edema, no cyanosis or clubbing  Neuro - A&O x 3, normal strength, CN II to 12 intact      Assessment & Plan:   OSA (obstructive sleep apnea) I have reviewed his sleep test results with the patient.  Explained how sleep apnea can affect the patient's health.  Driving precautions and importance of weight loss were discussed.  Treatment options for sleep apnea were reviewed.  Will proceed with CPAP at 10 cm H2O.    Updated Medication List Outpatient Encounter Prescriptions as of 03/23/2011  Medication Sig Dispense Refill  . aspirin 81 MG tablet Take 81 mg by mouth daily.        . Cholecalciferol (VITAMIN D3) 1000 UNITS CAPS Take 1 capsule by mouth daily.        Marland Kitchen Clopidogrel Bisulfate (PLAVIX PO) Take 75 mg by mouth daily.       . Coenzyme Q10 (CO Q-10) 200 MG CAPS Take 200 mg by mouth daily.        . CRESTOR 5 MG tablet Take 0.5 tablets by mouth every other day.       . hydrocortisone (ANUSOL-HC) 25 MG suppository prn      . lisinopril (PRINIVIL,ZESTRIL) 5 MG tablet Take 1 tablet by mouth Daily.      . metoprolol tartrate (LOPRESSOR) 25 MG tablet Take 1 tablet by mouth Twice daily.      Marland Kitchen NITROSTAT 0.4 MG SL tablet Take 1 tablet for chest pains every 5 minutes up to 3 doses      . pantoprazole (PROTONIX) 40 MG tablet Take 1 tablet by mouth Daily.      . traMADol (ULTRAM) 50 MG tablet Take 1 to 2 tabs by mouth two times a day as needed       . triamcinolone (KENALOG) 0.5 % cream Apply topically 2 (two) times daily as needed.        . vitamin B-12 (CYANOCOBALAMIN) 1000 MCG tablet Take  1,000 mcg by mouth daily.

## 2011-03-23 NOTE — Progress Notes (Signed)
  Subjective:    Patient ID: [name], male    DOB: 01-22-1951, 60 y.o.   MRN: [id number]  HPI  The patient presents for a follow-up of  chronic hypertension, chronic dyslipidemia, type 2 diabetes, MI controlled with medicines No CP. C/o OA in hips    Review of Systems  Constitutional: Positive for activity change. Negative for chills.  HENT: Negative for rhinorrhea, neck stiffness and sinus pressure.   Eyes: Negative for pain.  Respiratory: Negative for cough, chest tightness, shortness of breath, wheezing and stridor.   Cardiovascular: Negative for chest pain, palpitations and leg swelling.  Gastrointestinal: Negative for abdominal pain and constipation.  Genitourinary: Negative for hematuria and difficulty urinating.  Musculoskeletal: Positive for arthralgias. Negative for myalgias, back pain, joint swelling and gait problem.  Skin: Negative for pallor.  Neurological: Negative for dizziness, speech difficulty and numbness.  Psychiatric/Behavioral: Negative for hallucinations and behavioral problems. The patient is not hyperactive.        Objective:   Physical Exam  Constitutional: He appears well-developed.       Obese   HENT:  Head: Normocephalic.  Mouth/Throat: Oropharynx is clear and moist.  Eyes: Left eye exhibits no discharge.  Neck: No JVD present. No tracheal deviation present.  Pulmonary/Chest: He has no wheezes.  Abdominal: He exhibits no mass. There is no rebound and no guarding.  Musculoskeletal: He exhibits tenderness (B hips). He exhibits no edema.  Lymphadenopathy:    He has no cervical adenopathy.  Neurological: No cranial nerve deficit. Coordination normal.  Skin: No rash noted.          Assessment & Plan:  OSA (obstructive sleep apnea) On CPAP  FATIGUE On Rx -- better  HYPOGONADISM Not on Rx    CAD, h/o MI  On Rx  Tobacco  --  He quit!  B hip OA  Loose wt. Will x ray later   [facility] docs reviewed. A complex case

## 2011-03-25 ENCOUNTER — Encounter (HOSPITAL_COMMUNITY): Payer: 59

## 2011-03-28 ENCOUNTER — Encounter (HOSPITAL_COMMUNITY): Payer: 59

## 2011-03-30 ENCOUNTER — Encounter (HOSPITAL_COMMUNITY): Payer: 59

## 2011-03-31 ENCOUNTER — Encounter: Payer: Self-pay | Admitting: Pulmonary Disease

## 2011-04-01 ENCOUNTER — Encounter: Payer: Self-pay | Admitting: Internal Medicine

## 2011-04-01 ENCOUNTER — Encounter (HOSPITAL_COMMUNITY): Payer: 59 | Attending: Internal Medicine

## 2011-04-01 ENCOUNTER — Encounter (HOSPITAL_COMMUNITY): Payer: 59

## 2011-04-01 DIAGNOSIS — Z9861 Coronary angioplasty status: Secondary | ICD-10-CM | POA: Insufficient documentation

## 2011-04-01 DIAGNOSIS — G4730 Sleep apnea, unspecified: Secondary | ICD-10-CM | POA: Insufficient documentation

## 2011-04-01 DIAGNOSIS — J449 Chronic obstructive pulmonary disease, unspecified: Secondary | ICD-10-CM | POA: Insufficient documentation

## 2011-04-01 DIAGNOSIS — Z79899 Other long term (current) drug therapy: Secondary | ICD-10-CM | POA: Insufficient documentation

## 2011-04-01 DIAGNOSIS — I2119 ST elevation (STEMI) myocardial infarction involving other coronary artery of inferior wall: Secondary | ICD-10-CM | POA: Insufficient documentation

## 2011-04-01 DIAGNOSIS — E785 Hyperlipidemia, unspecified: Secondary | ICD-10-CM | POA: Insufficient documentation

## 2011-04-01 DIAGNOSIS — I10 Essential (primary) hypertension: Secondary | ICD-10-CM | POA: Insufficient documentation

## 2011-04-01 DIAGNOSIS — Z5189 Encounter for other specified aftercare: Secondary | ICD-10-CM | POA: Insufficient documentation

## 2011-04-01 DIAGNOSIS — Z7982 Long term (current) use of aspirin: Secondary | ICD-10-CM | POA: Insufficient documentation

## 2011-04-01 DIAGNOSIS — J4489 Other specified chronic obstructive pulmonary disease: Secondary | ICD-10-CM | POA: Insufficient documentation

## 2011-04-01 DIAGNOSIS — Z7902 Long term (current) use of antithrombotics/antiplatelets: Secondary | ICD-10-CM | POA: Insufficient documentation

## 2011-04-01 DIAGNOSIS — I70209 Unspecified atherosclerosis of native arteries of extremities, unspecified extremity: Secondary | ICD-10-CM | POA: Insufficient documentation

## 2011-04-01 DIAGNOSIS — I2510 Atherosclerotic heart disease of native coronary artery without angina pectoris: Secondary | ICD-10-CM | POA: Insufficient documentation

## 2011-04-01 DIAGNOSIS — F17200 Nicotine dependence, unspecified, uncomplicated: Secondary | ICD-10-CM | POA: Insufficient documentation

## 2011-04-04 ENCOUNTER — Encounter (HOSPITAL_COMMUNITY): Payer: 59

## 2011-04-06 ENCOUNTER — Encounter (HOSPITAL_COMMUNITY): Payer: 59

## 2011-04-08 ENCOUNTER — Encounter (HOSPITAL_COMMUNITY): Payer: 59

## 2011-04-11 ENCOUNTER — Encounter (HOSPITAL_COMMUNITY): Payer: 59

## 2011-04-13 ENCOUNTER — Encounter (HOSPITAL_COMMUNITY): Payer: 59

## 2011-04-15 ENCOUNTER — Encounter (HOSPITAL_COMMUNITY): Payer: 59

## 2011-04-18 ENCOUNTER — Ambulatory Visit (INDEPENDENT_AMBULATORY_CARE_PROVIDER_SITE_OTHER): Payer: 59 | Admitting: Internal Medicine

## 2011-04-18 ENCOUNTER — Encounter (HOSPITAL_COMMUNITY): Payer: 59

## 2011-04-18 ENCOUNTER — Encounter: Payer: Self-pay | Admitting: Internal Medicine

## 2011-04-18 VITALS — BP 128/80 | HR 80 | Ht 70.0 in | Wt 251.0 lb

## 2011-04-18 DIAGNOSIS — I2510 Atherosclerotic heart disease of native coronary artery without angina pectoris: Secondary | ICD-10-CM

## 2011-04-18 DIAGNOSIS — I10 Essential (primary) hypertension: Secondary | ICD-10-CM

## 2011-04-18 DIAGNOSIS — E7800 Pure hypercholesterolemia, unspecified: Secondary | ICD-10-CM

## 2011-04-18 MED ORDER — TRAMADOL HCL 50 MG PO TABS: 50.0000 mg | ORAL_TABLET | Freq: Two times a day (BID) | ORAL | Status: DC

## 2011-04-18 MED ORDER — METOPROLOL SUCCINATE ER 50 MG PO TB24
50.0000 mg | ORAL_TABLET | Freq: Every day | ORAL | Status: DC
Start: 2011-04-18 — End: 2011-08-05

## 2011-04-18 NOTE — Progress Notes (Signed)
HP IJoseph A [name] is a 60 y.o. male With a history of PAD, hypertension, hyperlipidemia and COPD who recently presented to [facility] on April 15 with chest pain. EKG confirmed inferior ST elevation myocardial infarction. He was taken directly to the cardiac catheterization lab where he had a mid 80% lesion and a distal occlusion in the RCA. Both lesions were treated with a Promus drug-eluting stent. He had moderate residual nonobstructive disease in the PDA and PLB that was treated medically and his EF is preserved at 55%. He returns today for follow up. Of note, Crestor was added back to his medical regimen to see if he could tolerate it.  He has been seen once since d/c by Wende Mott.  He was also seen in pulm clinic and is now being treated for sleep apnea.  Tolerating. He has not been able to tolerate Crestor.  Complains of aches in arms, chest. Otherwise, he is doing well incardiac rehab.  BP is occasionally high, comes down after exercise.     Allergies  Allergen Reactions  . Pravastatin Sodium     REACTION: gasy, constipated  . Rosuvastatin     REACTION: myalgias  . Simvastatin     REACTION: myalgia    Current Outpatient Prescriptions  Medication Sig Dispense Refill  . aspirin 81 MG tablet Take 81 mg by mouth daily.        . Cholecalciferol (VITAMIN D3) 1000 UNITS CAPS Take 1 capsule by mouth daily.        Marland Kitchen Clopidogrel Bisulfate (PLAVIX PO) Take 75 mg by mouth daily.       . Coenzyme Q10 (CO Q-10) 200 MG CAPS Take 200 mg by mouth daily.        . hydrocortisone (ANUSOL-HC) 25 MG suppository prn      . lisinopril (PRINIVIL,ZESTRIL) 5 MG tablet Take 1 tablet by mouth Daily.      . pantoprazole (PROTONIX) 40 MG tablet Take 1 tablet by mouth Daily.      . traMADol (ULTRAM) 50 MG tablet Take 1 tablet (50 mg total) by mouth 2 (two) times daily. Take 1 to 2 tabs by mouth two times a day as needed  120 tablet  2  . triamcinolone (KENALOG) 0.5 % cream Apply topically 2 (two)  times daily as needed.        . vitamin B-12 (CYANOCOBALAMIN) 1000 MCG tablet Take 1,000 mcg by mouth daily.        Marland Kitchen DISCONTD: CRESTOR 5 MG tablet Take 0.5 tablets by mouth every other day.       Marland Kitchen DISCONTD: metoprolol tartrate (LOPRESSOR) 25 MG tablet Take 1 tablet by mouth Twice daily.      Marland Kitchen DISCONTD: traMADol (ULTRAM) 50 MG tablet Take 1 to 2 tabs by mouth two times a day as needed       . metoprolol (TOPROL XL) 50 MG 24 hr tablet Take 1 tablet (50 mg total) by mouth daily.  30 tablet  11  . NITROSTAT 0.4 MG SL tablet Take 1 tablet for chest pains every 5 minutes up to 3 doses      . tadalafil (CIALIS) 20 MG tablet Take 10 mg by mouth as needed. 1/2 or 1 by mouth every 1-3 days PRN       . vardenafil (LEVITRA) 20 MG tablet Take 20 mg by mouth daily as needed.        Marland Kitchen DISCONTD: Azilsartan Medoxomil (EDARBI) 80 MG TABS Take 1 tablet by  mouth daily.        Marland Kitchen DISCONTD: benazepril-hydrochlorthiazide (LOTENSIN HCT) 20-25 MG per tablet Take 1 tablet by mouth daily.        Marland Kitchen DISCONTD: lansoprazole (PREVACID) 30 MG capsule Take 30 mg by mouth daily.        Marland Kitchen DISCONTD: meloxicam (MOBIC) 15 MG tablet Take 15 mg by mouth daily.        Marland Kitchen DISCONTD: Pitavastatin Calcium (LIVALO) 4 MG TABS Take 1 tablet by mouth daily.          Past Medical History  Diagnosis Date  . Hiatal hernia   . Duodenitis   . Renal cyst   . Bell's palsy   . Hypercholesteremia   . Diverticulosis   . Cyst     sebacceous  . Renal cell carcinoma   . Bronchitis   . Low back pain   . Hypertension   . Hyperthyroidism   . Vitamin B12 deficiency   . PVD (peripheral vascular disease)     s/p right iliofem. bypass  . Pancreatitis   . GERD (gastroesophageal reflux disease)   . COPD (chronic obstructive pulmonary disease)   . Colon polyps   . Anxiety   . Coronary artery disease     a. s/p INF STEMI 4/12: DES x 2 (mid and dist RCA);  b. cath 4/12: oLAD 40%, mLAD 40%, pD1 30%, pOM1 40%, pOM3 50%, mRCA 80% (PCI), PDA 70% (med  tx), PLB1 60%  (med tx); EF 55%  . Meralgia paresthetica     right  . Erectile dysfunction   . Chest pain, unspecified   . OSA (obstructive sleep apnea)     PSG 03/02/11>>AHI 28.6, SpO2 low 88%, CPAP 10 cm H2O.  Marland Kitchen Myocardial infarction 4/12    2 stents    Past Surgical History  Procedure Date  . Bypass graft     Rt. iliofemoral   . Inguinal hernia repair     Rt.  . Tumor removal     Cryo Rena    Family History  Problem Relation Age of Onset  . Colon cancer Paternal Grandmother   . Cancer Paternal Grandmother     colon  . Hypertension    . Stroke Father   . Hypertension Mother     History   Social History  . Marital Status: Divorced    Spouse Name: N/A    Number of Children: N/A  . Years of Education: N/A   Occupational History  . business Production designer, theatre/television/film     [employer]   Social History Main Topics  . Smoking status: Former Smoker -- 1.0 packs/day for 40 years    Types: Cigarettes    Quit date: 02/15/2011  . Smokeless tobacco: Never Used   Comment: quit smoking in 2010 restarted 2011 1 ppd  . Alcohol Use: Yes  . Drug Use: No  . Sexually Active: Yes   Other Topics Concern  . Not on file   Social History Narrative  . No narrative on file    Review of Systems:  All systems reviewed.  They are negative to the above problem except as previously stated.  Vital Signs: BP 128/80  Pulse 80  Ht 5\' 10"  (1.778 m)  Wt 251 lb (113.853 kg)  BMI 36.01 kg/m2  Physical Exam  Patient is in NAD  HEENT:  Normocephalic, atraumatic. EOMI, PERRLA.  Neck: JVP is normal. No thyromegaly. No bruits.  Lungs: clear to auscultation. No rales no wheezes.  Heart: Regular rate  and rhythm. Normal S1, S2. No S3.   No significant murmurs. PMI not displaced.  Abdomen:  Supple, nontender. Normal bowel sounds. No masses. No hepatomegaly.  Extremities:   Good distal pulses throughout. No lower extremity edema.  Musculoskeletal :moving all extremities.  Neuro:   alert and oriented x3.  CN  II-XII grossly intact.  EKG:  NSR.  Inf MI.   Assessment and Plan:

## 2011-04-18 NOTE — Assessment & Plan Note (Signed)
Patient has not tolerated statins.  I told him to D/c  I would  recomm he stop Crestor.  Will call in a couple wks to see how doing and consider another agent.

## 2011-04-18 NOTE — Patient Instructions (Signed)
Your physician wants you to follow-up in:October 2012 You will receive a reminder letter in the mail two months in advance. If you don't receive a letter, please call our office to schedule the follow-up appointment.;

## 2011-04-18 NOTE — Assessment & Plan Note (Signed)
BP is overall good.  IOccasionally high.  He has just started Rx for OSA.  Should improve further.

## 2011-04-18 NOTE — Assessment & Plan Note (Signed)
Patient is recovering.  I would switch metoprolol to Toprol XL.  Continue other meds.  COntinue rehab.

## 2011-04-20 ENCOUNTER — Encounter (HOSPITAL_COMMUNITY): Payer: 59

## 2011-04-22 ENCOUNTER — Encounter (HOSPITAL_COMMUNITY): Payer: 59

## 2011-04-25 ENCOUNTER — Encounter (HOSPITAL_COMMUNITY): Payer: 59

## 2011-04-27 ENCOUNTER — Encounter (HOSPITAL_COMMUNITY): Payer: 59

## 2011-04-29 ENCOUNTER — Encounter (HOSPITAL_COMMUNITY): Payer: 59

## 2011-04-30 ENCOUNTER — Encounter: Payer: Self-pay | Admitting: Pulmonary Disease

## 2011-04-30 ENCOUNTER — Telehealth: Payer: Self-pay | Admitting: Pulmonary Disease

## 2011-04-30 NOTE — Telephone Encounter (Signed)
CPAP download 03/31/11 to 04/27/11>>Average use 6hrs .  Average AHI 3 with CPAP 10 cm H2O.  Will have my nurse inform pt that CPAP report looked good.  No change to current set up.

## 2011-05-02 ENCOUNTER — Encounter (HOSPITAL_COMMUNITY): Payer: 59

## 2011-05-03 NOTE — Telephone Encounter (Signed)
Spoke with pt and notified of results/recs per Dr Craige Cotta. Pt verbalized understanding.

## 2011-05-04 ENCOUNTER — Encounter (HOSPITAL_COMMUNITY): Payer: 59 | Attending: Internal Medicine

## 2011-05-04 ENCOUNTER — Encounter (HOSPITAL_COMMUNITY): Payer: 59

## 2011-05-04 DIAGNOSIS — Z7982 Long term (current) use of aspirin: Secondary | ICD-10-CM | POA: Insufficient documentation

## 2011-05-04 DIAGNOSIS — Z9861 Coronary angioplasty status: Secondary | ICD-10-CM | POA: Insufficient documentation

## 2011-05-04 DIAGNOSIS — Z7902 Long term (current) use of antithrombotics/antiplatelets: Secondary | ICD-10-CM | POA: Insufficient documentation

## 2011-05-04 DIAGNOSIS — J4489 Other specified chronic obstructive pulmonary disease: Secondary | ICD-10-CM | POA: Insufficient documentation

## 2011-05-04 DIAGNOSIS — J449 Chronic obstructive pulmonary disease, unspecified: Secondary | ICD-10-CM | POA: Insufficient documentation

## 2011-05-04 DIAGNOSIS — G4730 Sleep apnea, unspecified: Secondary | ICD-10-CM | POA: Insufficient documentation

## 2011-05-04 DIAGNOSIS — I2119 ST elevation (STEMI) myocardial infarction involving other coronary artery of inferior wall: Secondary | ICD-10-CM | POA: Insufficient documentation

## 2011-05-04 DIAGNOSIS — E785 Hyperlipidemia, unspecified: Secondary | ICD-10-CM | POA: Insufficient documentation

## 2011-05-04 DIAGNOSIS — I2510 Atherosclerotic heart disease of native coronary artery without angina pectoris: Secondary | ICD-10-CM | POA: Insufficient documentation

## 2011-05-04 DIAGNOSIS — F17200 Nicotine dependence, unspecified, uncomplicated: Secondary | ICD-10-CM | POA: Insufficient documentation

## 2011-05-04 DIAGNOSIS — Z79899 Other long term (current) drug therapy: Secondary | ICD-10-CM | POA: Insufficient documentation

## 2011-05-04 DIAGNOSIS — I10 Essential (primary) hypertension: Secondary | ICD-10-CM | POA: Insufficient documentation

## 2011-05-04 DIAGNOSIS — I70209 Unspecified atherosclerosis of native arteries of extremities, unspecified extremity: Secondary | ICD-10-CM | POA: Insufficient documentation

## 2011-05-04 DIAGNOSIS — Z5189 Encounter for other specified aftercare: Secondary | ICD-10-CM | POA: Insufficient documentation

## 2011-05-06 ENCOUNTER — Encounter (HOSPITAL_COMMUNITY): Payer: 59

## 2011-05-09 ENCOUNTER — Encounter (HOSPITAL_COMMUNITY): Payer: 59

## 2011-05-11 ENCOUNTER — Encounter (HOSPITAL_COMMUNITY): Payer: 59

## 2011-05-13 ENCOUNTER — Encounter (HOSPITAL_COMMUNITY): Payer: 59

## 2011-05-16 ENCOUNTER — Encounter (HOSPITAL_COMMUNITY): Payer: 59

## 2011-05-18 ENCOUNTER — Encounter (HOSPITAL_COMMUNITY): Payer: 59

## 2011-05-20 ENCOUNTER — Encounter (HOSPITAL_COMMUNITY): Payer: 59

## 2011-05-23 ENCOUNTER — Encounter (HOSPITAL_COMMUNITY): Payer: 59

## 2011-05-25 ENCOUNTER — Encounter (HOSPITAL_COMMUNITY): Payer: 59

## 2011-05-27 ENCOUNTER — Encounter (HOSPITAL_COMMUNITY): Payer: 59

## 2011-05-30 ENCOUNTER — Encounter (HOSPITAL_COMMUNITY): Payer: 59

## 2011-06-01 ENCOUNTER — Encounter (HOSPITAL_COMMUNITY): Payer: 59 | Attending: Internal Medicine

## 2011-06-01 ENCOUNTER — Encounter (HOSPITAL_COMMUNITY): Payer: 59

## 2011-06-01 DIAGNOSIS — F17200 Nicotine dependence, unspecified, uncomplicated: Secondary | ICD-10-CM | POA: Insufficient documentation

## 2011-06-01 DIAGNOSIS — G4730 Sleep apnea, unspecified: Secondary | ICD-10-CM | POA: Insufficient documentation

## 2011-06-01 DIAGNOSIS — J4489 Other specified chronic obstructive pulmonary disease: Secondary | ICD-10-CM | POA: Insufficient documentation

## 2011-06-01 DIAGNOSIS — I70209 Unspecified atherosclerosis of native arteries of extremities, unspecified extremity: Secondary | ICD-10-CM | POA: Insufficient documentation

## 2011-06-01 DIAGNOSIS — Z7982 Long term (current) use of aspirin: Secondary | ICD-10-CM | POA: Insufficient documentation

## 2011-06-01 DIAGNOSIS — E785 Hyperlipidemia, unspecified: Secondary | ICD-10-CM | POA: Insufficient documentation

## 2011-06-01 DIAGNOSIS — I10 Essential (primary) hypertension: Secondary | ICD-10-CM | POA: Insufficient documentation

## 2011-06-01 DIAGNOSIS — Z7902 Long term (current) use of antithrombotics/antiplatelets: Secondary | ICD-10-CM | POA: Insufficient documentation

## 2011-06-01 DIAGNOSIS — J449 Chronic obstructive pulmonary disease, unspecified: Secondary | ICD-10-CM | POA: Insufficient documentation

## 2011-06-01 DIAGNOSIS — I2510 Atherosclerotic heart disease of native coronary artery without angina pectoris: Secondary | ICD-10-CM | POA: Insufficient documentation

## 2011-06-01 DIAGNOSIS — I2119 ST elevation (STEMI) myocardial infarction involving other coronary artery of inferior wall: Secondary | ICD-10-CM | POA: Insufficient documentation

## 2011-06-01 DIAGNOSIS — Z79899 Other long term (current) drug therapy: Secondary | ICD-10-CM | POA: Insufficient documentation

## 2011-06-01 DIAGNOSIS — Z5189 Encounter for other specified aftercare: Secondary | ICD-10-CM | POA: Insufficient documentation

## 2011-06-01 DIAGNOSIS — Z9861 Coronary angioplasty status: Secondary | ICD-10-CM | POA: Insufficient documentation

## 2011-06-02 ENCOUNTER — Encounter: Payer: Self-pay | Admitting: Pulmonary Disease

## 2011-06-03 ENCOUNTER — Encounter (HOSPITAL_COMMUNITY): Payer: 59

## 2011-06-03 ENCOUNTER — Telehealth: Payer: Self-pay | Admitting: Internal Medicine

## 2011-06-03 DIAGNOSIS — E782 Mixed hyperlipidemia: Secondary | ICD-10-CM

## 2011-06-03 NOTE — Telephone Encounter (Signed)
Spoke with pt who is not having as many muscle aches now that he has been off his cholesterol medications.  Informed pt that I will forward this information to Dr Tenny Craw and Annice Pih for review.

## 2011-06-03 NOTE — Telephone Encounter (Signed)
Pt was to be called back a couple weeks ago re possibly changing cholesterol meds due to his current med not agreeing with him and hasn't hear back

## 2011-06-06 ENCOUNTER — Encounter (HOSPITAL_COMMUNITY): Payer: 59

## 2011-06-06 ENCOUNTER — Emergency Department (HOSPITAL_COMMUNITY): Payer: 59

## 2011-06-06 ENCOUNTER — Inpatient Hospital Stay (HOSPITAL_COMMUNITY)
Admission: EM | Admit: 2011-06-06 | Discharge: 2011-06-08 | DRG: 287 | Discharge status: Against Medical Advice | Payer: 59 | Attending: Cardiology | Admitting: Cardiology

## 2011-06-06 DIAGNOSIS — F17200 Nicotine dependence, unspecified, uncomplicated: Secondary | ICD-10-CM | POA: Diagnosis present

## 2011-06-06 DIAGNOSIS — I2510 Atherosclerotic heart disease of native coronary artery without angina pectoris: Secondary | ICD-10-CM | POA: Diagnosis present

## 2011-06-06 DIAGNOSIS — K219 Gastro-esophageal reflux disease without esophagitis: Secondary | ICD-10-CM | POA: Diagnosis present

## 2011-06-06 DIAGNOSIS — I10 Essential (primary) hypertension: Secondary | ICD-10-CM | POA: Diagnosis present

## 2011-06-06 DIAGNOSIS — R0789 Other chest pain: Principal | ICD-10-CM | POA: Diagnosis present

## 2011-06-06 DIAGNOSIS — R110 Nausea: Secondary | ICD-10-CM

## 2011-06-06 DIAGNOSIS — R079 Chest pain, unspecified: Secondary | ICD-10-CM

## 2011-06-06 DIAGNOSIS — E785 Hyperlipidemia, unspecified: Secondary | ICD-10-CM | POA: Diagnosis present

## 2011-06-06 DIAGNOSIS — I252 Old myocardial infarction: Secondary | ICD-10-CM

## 2011-06-06 LAB — DIFFERENTIAL
Basophils Absolute: 0.1 10*3/uL (ref 0.0–0.1)
Basophils Relative: 1 % (ref 0–1)
Eosinophils Relative: 2 % (ref 0–5)
Monocytes Absolute: 0.7 10*3/uL (ref 0.1–1.0)
Neutro Abs: 5.6 10*3/uL (ref 1.7–7.7)

## 2011-06-06 LAB — COMPREHENSIVE METABOLIC PANEL
ALT: 15 U/L (ref 0–53)
AST: 17 U/L (ref 0–37)
Alkaline Phosphatase: 81 U/L (ref 39–117)
CO2: 27 mEq/L (ref 19–32)
Calcium: 9.7 mg/dL (ref 8.4–10.5)
Chloride: 103 mEq/L (ref 96–112)
GFR calc non Af Amer: >60 mL/min (ref >60)
Potassium: 3.9 mEq/L (ref 3.5–5.1)
Sodium: 139 mEq/L (ref 135–145)
Total Bilirubin: 0.4 mg/dL (ref 0.3–1.2)

## 2011-06-06 LAB — PROTIME-INR
INR: 1.02 (ref 0.00–1.49)
Prothrombin Time: 13.6 seconds (ref 11.6–15.2)

## 2011-06-06 LAB — CBC
MCHC: 34.8 g/dL (ref 30.0–36.0)
RDW: 13.2 % (ref 11.5–15.5)
WBC: 10.3 10*3/uL (ref 4.0–10.5)

## 2011-06-06 LAB — POCT I-STAT TROPONIN I

## 2011-06-07 ENCOUNTER — Ambulatory Visit: Payer: 59 | Admitting: Pulmonary Disease

## 2011-06-07 DIAGNOSIS — I2510 Atherosclerotic heart disease of native coronary artery without angina pectoris: Secondary | ICD-10-CM

## 2011-06-07 LAB — HEPARIN LEVEL (UNFRACTIONATED): Heparin Unfractionated: 0.34 IU/mL (ref 0.30–0.70)

## 2011-06-07 LAB — CBC
MCV: 90.7 fL (ref 78.0–100.0)
Platelets: 207 10*3/uL (ref 150–400)
RBC: 4.82 MIL/uL (ref 4.22–5.81)
WBC: 7.4 10*3/uL (ref 4.0–10.5)

## 2011-06-07 LAB — CARDIAC PANEL(CRET KIN+CKTOT+MB+TROPI)
CK, MB: 3.3 ng/mL (ref 0.3–4.0)
Troponin I: <0.3 ng/mL (ref <0.30)
Troponin I: <0.3 ng/mL (ref <0.30)

## 2011-06-07 LAB — BASIC METABOLIC PANEL
BUN: 17 mg/dL (ref 6–23)
GFR calc non Af Amer: >60 mL/min (ref >60)
Glucose, Bld: 114 mg/dL — ABNORMAL HIGH (ref 70–99)
Potassium: 3.5 mEq/L (ref 3.5–5.1)

## 2011-06-08 ENCOUNTER — Encounter (HOSPITAL_COMMUNITY): Payer: 59

## 2011-06-08 LAB — BASIC METABOLIC PANEL
Calcium: 8.9 mg/dL (ref 8.4–10.5)
Creatinine, Ser: 0.93 mg/dL (ref 0.50–1.35)
GFR calc Af Amer: >60 mL/min (ref >60)
GFR calc non Af Amer: >60 mL/min (ref >60)

## 2011-06-08 LAB — CBC
MCH: 30.4 pg (ref 26.0–34.0)
MCHC: 33.6 g/dL (ref 30.0–36.0)
Platelets: 209 10*3/uL (ref 150–400)
RBC: 4.57 MIL/uL (ref 4.22–5.81)
RDW: 13.4 % (ref 11.5–15.5)

## 2011-06-08 LAB — HEPARIN LEVEL (UNFRACTIONATED): Heparin Unfractionated: <0.1 IU/mL — ABNORMAL LOW (ref 0.30–0.70)

## 2011-06-10 ENCOUNTER — Encounter (HOSPITAL_COMMUNITY): Payer: 59

## 2011-06-12 NOTE — Telephone Encounter (Signed)
Thoughts on cholesterol  Not tolerant to statins

## 2011-06-13 ENCOUNTER — Encounter (HOSPITAL_COMMUNITY): Payer: 59

## 2011-06-13 NOTE — Telephone Encounter (Signed)
Would try fenofibrate or niacin given increase in TG as well as LDL.

## 2011-06-15 ENCOUNTER — Encounter (HOSPITAL_COMMUNITY): Payer: 59

## 2011-06-15 NOTE — H&P (Signed)
NAMEMAXXWELL, EDGETT             ACCOUNT NO.:  [id number]  MEDICAL RECORD NO.:  [id number]  LOCATION:  2022                         FACILITY:  [facility]  PHYSICIAN:  Jesse Sans. Jada Fass, MD, FACCDATE OF BIRTH:  Mar 23, 1951  DATE OF ADMISSION:  06/06/2011 DATE OF DISCHARGE:                             HISTORY & PHYSICAL   CHIEF COMPLAINT:  Chest tightness, nausea and some sweating today.  HISTORY OF PRESENT ILLNESS:  Mr. Zeitler is a very pleasant 60 year old gentleman who began to have these symptoms this afternoon.  He was concerned at first that it might just be his reflux and then began to worry further.  He drove home from work.  He decided not to take a nitroglycerin because he had taken a Cialis yesterday.  He came to the emergency room at the encouragement of his family.  Here, his EKG showed old evolutionary changes of an inferior Maelle Sheaffer infarct that he had on February 13, 2011.  There were no acute changes.  He was not given sublingual nitroglycerin because of the Cialis history. Remarkably, his first set of enzymes were negative.  He had a STEMI of the inferior Peyson Delao on February 13, 2011.  He had two drug- eluting stents placed to the mid and distal right coronary artery.  He had some distal disease which was left alone in the posterolateral and PDA.  He also had some nonobstructive disease in the LAD.  He had normal left ventricular systolic function.  PAST MEDICAL HISTORY:  He has a history of hyperlipidemia with a statin intolerance.  He has a history of GERD and is on a PPI.  He has a history of hypertension.  This is under good control.  He has a history of nonobstructive carotid disease with last carotids in 2009.  He has a history of tobacco use which he quit at the time of his infarct.  He has a history of iliofemoral bypass in the past.  This is on the right.  MEDICATIONS:  At home included: 1. Lisinopril 5 mg a day. 2. Plavix 75 mg a day. 3. Metoprolol tartrate 25 mg  twice a day. 4. Nitroglycerin tablets p.r.n. 5. Protonix 40 mg a day. 6. Aspirin 81 mg a day. 7. Tramadol 1 tablet every 8 hours 50 mg p.r.n. pain. 8. Vitamin D over-the-counter.  SOCIAL HISTORY:  He lives in [place].  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  He is with his girlfriend.  He occasionally drinks alcohol. He smoked up until the time of his infarct for greater than 40 years.  FAMILY HISTORY:  Father died of a stroke.  Mother is alive and well at 93.  REVIEW OF SYSTEMS:  Other than HPI is negative.  Specifically, he has no dysphagia, classic heartburn, water brash, melena or hematochezia.  PHYSICAL EXAMINATION:  GENERAL:  He is in no acute distress. SKIN:  Warm and dry. VITAL SIGNS:  Blood pressure was initially 158/87, pulse is 63 and regular, respiration rate 16, temperature is 97.9. HEENT:  Normocephalic, atraumatic.  PERRLA.  Extraocular movements intact.  Sclerae are clear.  Facial symmetry is normal.  Dentition satisfactory. NECK:  Supple.  Carotids upstrokes are equal bilaterally without obvious  bruit.  Thyroid is not enlarged.  Trachea is midline. LUNGS:  Clear to auscultation and percussion. HEART:  A nondisplaced PMI.  Soft S1-S2 that splits.  No rub or gallop. ABDOMEN:  Soft, good bowel sounds.  No epigastric tenderness.  No hepatosplenomegaly. EXTREMITIES:  No cyanosis, clubbing or edema.  Pulses are present. NEURO:  Grossly intact. PSYCHIATRIC:  Normal affect.  LABORATORY DATA:  Unremarkable.  Chest x-ray shows no cardiomegaly. Stable bronchial thickening and interstitial prominence.  EKG is as above.  ASSESSMENT AND PLAN: 1. Unstable angina.     a.     Status post ST elevation myocardial infarction of the      inferior Arlee Santosuosso February 13, 2011.     b.     Status post drug-eluting stents x2 in the mid and distal      right coronary artery with residual distal disease.     c.     Residual nonobstructive coronary artery disease.     d.     Normal left  ventricular systolic function. 2. Dyslipidemia statin intolerance secondary to myalgias. 3. Gastroesophageal reflux disease on PPI. 4. Hypertension well controlled. 5. Nonobstructive carotid disease currently asymptomatic. 6. History of tobacco use which he quit in April 2012. 7. Peripheral vascular disease status post iliofemoral bypass on the     right.  PLAN: 1. Admit to telemetry. 2. Cycle enzymes. 3. IV heparin started in the emergency room. 4. Continue aspirin, beta blocker, ACE and Plavix. 5. Diagnostic cath tomorrow with potential intervention.  The patient understands the plan and agrees to proceed.     Karrine Kluttz C. Daleen Squibb, MD, [facility]     TCW/MEDQ  D:  06/06/2011  T:  06/07/2011  Job:  [id number]  Electronically Signed by Valera Castle MD [facility] on 06/15/2011 10:39:14 AM

## 2011-06-16 MED ORDER — FENOFIBRATE 145 MG PO TABS: 145.0000 mg | ORAL_TABLET | Freq: Every day | ORAL | Status: DC

## 2011-06-16 NOTE — Discharge Summary (Signed)
[name], [name]             ACCOUNT NO.:  [id number]  MEDICAL RECORD NO.:  [id number]  LOCATION:  2022                         FACILITY:  [facility]  PHYSICIAN:  Arturo Morton. Riley Kill, MD, FACCDATE OF BIRTH:  1951/07/27  DATE OF ADMISSION:  06/06/2011 DATE OF DISCHARGE:  06/08/2011                              DISCHARGE SUMMARY   PRIMARY CARDIOLOGIST:  Pricilla Riffle, MD, [facility].  PRIMARY CARE PROVIDER:  Georgina Quint. Plotnikov, MD.  DISCHARGE DIAGNOSIS:  Chest pain without objective evidence of ischemia.  SECONDARY DIAGNOSES: 1. Coronary artery disease status post acute inferior myocardial     infarction, April 2012, with drug-eluting stent placement to the     right coronary artery x2. 2. Hypertension. 3. Hyperlipidemia. 4. Ongoing tobacco abuse. 5. Gastroesophageal reflux disease. 6. Peripheral vascular disease status post iliofemoral bypass.  ALLERGIES:  He has a STATIN intolerance.  PROCEDURES:  Left heart cardiac catheterization performed June 07, 2011 revealing patent right coronary artery stent.  Otherwise, nonobstructive disease.  Medical management recommended.  HISTORY OF PRESENT ILLNESS:  A 60 year old male with prior history of coronary artery disease status post recent inferior ST-elevation MI in April 2012, status post drug-eluting stent placed to the right coronary artery x2.  The patient was in his usual state of health until the day of admission when he began to experience substernal chest discomfort, which he thought maybe indigestion.  He took reflux medications without relief.  The patient was unable to take nitrates because of having taken Cialis within a 24-hour period.  The patient was taken to [facility] ED where ECG showed no acute changes and point-of-care markers were negative.  He was admitted for evaluation.  HOSPITAL COURSE:  The patient ruled out for MI.  He had no additional chest pain and underwent diagnostic catheterization on August  7, revealing a 30%-40% stenosis with a previously placed mid right coronary artery stent with a patent distal RCA stent.  He otherwise had nonobstructive disease including a 70% stenosis in the obtuse marginal. Medical therapy was recommended.  The patient has had no additional chest discomfort.  We will plan to discharge him home today in good condition and have him follow up with Dr. Tenny Craw for an outpatient treadmill test.  He has been counseled on the importance of smoking cessation and he will be discharged home today in good condition.  DISCHARGE LABS:  Hemoglobin 13.9, hematocrit 41.4, WBC 8.5, platelets 209.  Sodium 138, potassium 3.9, chloride 106, CO2 27, BUN 30, creatinine 0.93, glucose 105.  Total bilirubin 0.4, alkaline phosphatase 81, AST 17, ALT 15, total protein 7.1, albumin 3.7, calcium 8.9, CK 126, MB 3.2, troponin-I less than 0.30.  DISPOSITION:  The patient will be discharged home today in good condition.  FOLLOWUP PLANS AND APPOINTMENTS:  The patient will follow up with Dr. Dietrich Pates for a treadmill study on August 27 at 12:15 p.m.  Follow up with Dr. Posey Rea as previously scheduled.  DISCHARGE MEDICATIONS: 1. Lisinopril 5 mg daily. 2. Aspirin 81 mg daily. 3. Plavix 75 mg daily. 4. Coenzyme Q10 daily. 5. Metoprolol XL 50 mg daily. 6. Nitroglycerin 0.4 mg sublingual p.r.n. chest pain. 7. Protonix 40 mg daily. 8.  Vitamin B12 1000 mcg daily. 9. Vitamin D 1000 units daily.  OUTSTANDING LAB STUDIES:  Follow up exercise treadmill test, August 27.  DURATION OF DISCHARGE ENCOUNTER:  35 minutes including physician time.     Nicolasa Ducking, ANP   ______________________________ Arturo Morton. Riley Kill, MD, [facility]    CB/MEDQ  D:  06/08/2011  T:  06/08/2011  Job:  [id number]  cc:   Georgina Quint. Plotnikov, MD  Electronically Signed by Nicolasa Ducking ANP on 06/10/2011 03:35:50 PM Electronically Signed by Shawnie Pons MD [facility] on 06/16/2011 09:52:16 AM

## 2011-06-16 NOTE — Telephone Encounter (Signed)
Try fenofibrate 145 mg.  F/U lipids and AST in 8 wks.

## 2011-06-16 NOTE — Telephone Encounter (Signed)
Called patient and advised to start TRICOR 145mg  per Dr.Ross. Will set up lab appointment  when he returns to clinic at the end of the month for apx. 8 weeks from now.

## 2011-06-16 NOTE — Cardiovascular Report (Signed)
NAMETHEODIS, KINSEL             ACCOUNT NO.:  [id number]  MEDICAL RECORD NO.:  [id number]  LOCATION:  2022                         FACILITY:  [facility]  PHYSICIAN:  Arturo Morton. Riley Kill, MD, FACCDATE OF BIRTH:  27-Oct-1951  DATE OF PROCEDURE:  06/07/2011 DATE OF DISCHARGE:                           CARDIAC CATHETERIZATION   INDICATIONS:  Mr. [name] is a 60 year old gentleman who presents with some recurrent chest discomfort.  Whether or not it is ischemic or not is unclear.  The current study was done to assess coronary anatomy.  PROCEDURE: 1. Access for angiographic studies without left heart catheterization. 2. Selective coronary arteriography.  DESCRIPTION OF PROCEDURE:  The procedure was difficult.  In review of Dr. Jari Sportsman initial catheterization, access from the right radial artery was somewhat difficult.  As such, we elected to go initially from the aortobifemoral grafts.  On both the right and the left with access sticks in several locations, we were able to get excellent flow, but never able to access the true graft.  Whether this has a high takeoff or not is unclear.  We did have access sites as high as the near superior aspect of the femoral head.  Excellent flow was noted each time, but we were unable to get past the mid iliac on the right, and the aortobifemoral junction on the left.  Our assumption was that we were likely not in the graft itself, we also tried a low stick on the right at the normal common femoral site, now we were able to get access there, but again the wire would not feed into the graft itself.  As such, we then abandoned that approach.  The left radial artery was then entered, and a 5-French sheath was placed along with 3 mg of intra-arterial verapamil, and 4000 units of heparin.  There is some tortuosity in the upper arm, but we were able to access the central aorta, and views of both the right and left coronary arteries were obtained.  Based on  the angiographic findings, ventriculography was not performed.  There were no major complications.  We were able to move the catheters without difficulty, and a TR band was placed.  There were no major complications.  HEMODYNAMIC DATA:  Central aortic pressure 169/90, mean 121.  ANGIOGRAPHIC DATA: 1. The right coronary artery.  His previous artery has been intervened     upon.  I have reviewed the old films.  There is a stent in the mid     vessel that has a slight eccentricity at a bend point, but with no     more than about 30-40% narrowing and does appear to be widely     patent without evidence of thrombus.  The distally placed stent in     the distal right coronary artery also appears to be widely patent     without significant high-grade narrowing of more than 20%.  There     was a diffuse plaque of 50-60%, possibly 70% in the midportion of     the PDA, which is a relatively small caliber vessel.  The     posterolateral artery is without significant compromise. 2. The left main is without critical  disease. 3. The LAD is heavily calcified proximally, and there is a fair amount     of plaque in the proximal vessel, but this does not appear to     exceed 40%.  Multiple views were obtained.  The distal vessel wraps     down to the apex and has a fair amount of mild luminal     irregularity. 4. There is a ramus intermedius with some mild irregularity, but no     critical narrowing. 5. The circumflex system has a marginal branch that has a somewhat     distal 70% segmental plaque similar to the previous study.  The AV     circumflex provides a second marginal or posterolateral branch     without critical narrowing.  CONCLUSIONS: 1. Continued patency of the previously placed stents in the right     coronary artery is noted above. 2. No significant change in the appearance of the left coronary     vessel.  DISPOSITION:  The symptoms could possibly be from the inherent  disease. Prior to the next catheterization, it would be worthwhile to do a CT angiogram of the abdomen to get a better sense of where the grafts should actually come into the native vessels.  The femoral access might be helpful as both right radial and left radial access were somewhat difficult.  I will notify Dr. Tenny Craw of the results.     Arturo Morton. Riley Kill, MD, [facility]     TDS/MEDQ  D:  06/07/2011  T:  06/07/2011  Job:  [id number]  cc:   Pricilla Riffle, MD, [facility]  Electronically Signed by Shawnie Pons MD [facility] on 06/16/2011 09:52:12 AM

## 2011-06-17 ENCOUNTER — Encounter (HOSPITAL_COMMUNITY): Payer: 59

## 2011-06-20 ENCOUNTER — Encounter (HOSPITAL_COMMUNITY): Payer: 59

## 2011-06-22 ENCOUNTER — Encounter (HOSPITAL_COMMUNITY): Payer: 59

## 2011-06-24 ENCOUNTER — Encounter (HOSPITAL_COMMUNITY): Payer: 59

## 2011-06-27 ENCOUNTER — Telehealth: Payer: Self-pay | Admitting: Gastroenterology

## 2011-06-27 ENCOUNTER — Encounter (HOSPITAL_COMMUNITY): Payer: 59

## 2011-06-27 ENCOUNTER — Ambulatory Visit (INDEPENDENT_AMBULATORY_CARE_PROVIDER_SITE_OTHER): Payer: 59 | Admitting: Internal Medicine

## 2011-06-27 DIAGNOSIS — K219 Gastro-esophageal reflux disease without esophagitis: Secondary | ICD-10-CM

## 2011-06-27 DIAGNOSIS — I2510 Atherosclerotic heart disease of native coronary artery without angina pectoris: Secondary | ICD-10-CM

## 2011-06-27 NOTE — Patient Instructions (Signed)
You have been referred to Dr.Kaplan in GI Dept. [address] [place].

## 2011-06-27 NOTE — Telephone Encounter (Signed)
Called number listed and there was no answer.  Pt having non-cardiac chest pain, had stress test today and cardiologist wants pt seen for Reflux. Pt scheduled to see Mike Gip PA 06/30/11@8 :Richardo Priest to notify pt of appt date and time.

## 2011-06-27 NOTE — Progress Notes (Signed)
Exercise Treadmill Test  Pre-Exercise Testing Evaluation Rhythm: normal sinus  Rate: 72   PR:  .16 QRS:  .10  QT:  .39 QTc: .42     Test  Exercise Tolerance Test Ordering MD: Dietrich Pates, MD  Interpreting MD:  Dietrich Pates, MD  Unique Test No: 1  Treadmill:  1  Indication for ETT: chest pain - rule out ischemia  Contraindication to ETT: No   Stress Modality: exercise - treadmill  Cardiac Imaging Performed: non   Protocol: standard Bruce - maximal  Max BP:  204/73  Max MPHR (bpm):  161 85% MPR (bpm):  136  MPHR obtained (bpm):  121 % MPHR obtained:  75  Reached 85% MPHR (min:sec):   Total Exercise Time (min-sec):6:07  Workload in METS:  6.3 Borg Scale: 17  Reason ETT Terminated:  patient's desire to stop    ST Segment Analysis At Rest: normal ST segments - no evidence of significant ST depression With Exercise: no evidence of significant ST depression  Other Information Arrhythmia:  No Angina during ETT:  absent (0) Quality of ETT: Patient only reached 75% predicted maximal HR but achieved PRP of 24,600.  Probable adequate work load.  ETT Interpretation:  normal - no evidence of ischemia by ST analysis  Comments:   Recommendations:

## 2011-06-29 ENCOUNTER — Encounter (HOSPITAL_COMMUNITY): Payer: 59

## 2011-06-29 ENCOUNTER — Ambulatory Visit: Payer: 59 | Admitting: Internal Medicine

## 2011-06-29 ENCOUNTER — Ambulatory Visit: Payer: 59 | Admitting: Physician Assistant

## 2011-06-30 ENCOUNTER — Ambulatory Visit (INDEPENDENT_AMBULATORY_CARE_PROVIDER_SITE_OTHER): Payer: 59 | Admitting: Physician Assistant

## 2011-06-30 ENCOUNTER — Encounter: Payer: Self-pay | Admitting: Physician Assistant

## 2011-06-30 ENCOUNTER — Telehealth: Payer: Self-pay | Admitting: Internal Medicine

## 2011-06-30 DIAGNOSIS — R079 Chest pain, unspecified: Secondary | ICD-10-CM

## 2011-06-30 DIAGNOSIS — K219 Gastro-esophageal reflux disease without esophagitis: Secondary | ICD-10-CM

## 2011-06-30 DIAGNOSIS — R110 Nausea: Secondary | ICD-10-CM

## 2011-06-30 MED ORDER — PANTOPRAZOLE SODIUM 40 MG PO TBEC: DELAYED_RELEASE_TABLET | ORAL | Status: DC

## 2011-06-30 NOTE — Telephone Encounter (Signed)
Called Maria at Cardiac rehab and advised that it is OK for patient to return to cardiac rehab per Dr.Ross. I also faxed back a form to cardiac rehab stating this to [phone].

## 2011-06-30 NOTE — Progress Notes (Addendum)
Subjective:    Patient ID: [name], male    DOB: 1951-01-15, 60 y.o.   MRN: [id number]  HPI [name] is a 60 year old white male known to Dr. Arlyce Dice from previous procedures. He comes in today for evaluation of chest pain. He did undergo an upper endoscopy in 2007 for evaluation of abdominal pain and was found to have duodenitis but an otherwise negative exam. He had colonoscopy last in August of 2010 showed mild sigmoid diverticulosis and was otherwise negative. He does have several other significant medical issues including coronary artery disease he is status post MI in April of 2012 and had 2 stents placed. He is now on Plavix. He also has had a history of peripheral artery disease is status post right iliofemoral bypass, history of COPD hypertension hyperlipidemia and renal cell cancer.  Patient relates that he has been having intermittent mild sharp substernal chest pains over the past 4-5 weeks. He had an episode about 3 weeks ago with the same pain, associated with mild diaphoresis and nausea which was transient low but says he just did not feel" right",, and therefore presented to the emergency room. He does have nitroglycerin to use on a when necessary basis for his heart disease but did not try any because he had taken a half of a cialis the day before. He was admitted to the hospital on the cardiology service, ruled out for an MI and underwent cardiac catheterization which showed patent right coronary artery stents, but was a difficult procedure and there was a diffuse plaque of 50-60% possibly 70% in the midportion of the PDA and a heavily calcified LAD with a fair amount of plaque in the proximal vessel it was felt that his symptoms may be due to inherent disease and it was suggested he had CT angiogram prior to the next catheterization to get a better sense of where his graft should actually come into the native vessels.  Since that time he has continued to have a mild pains on a daily  basis which he is now associating with drinking or eating anything hot or cold, and sometimes more notable after a meal. He denies any nighttime symptoms, and is unable to stent to state specifically that his symptoms are exacerbated by any exertion. He denies any dysphagia or odynophagia. He does admit that he occasionally feels hot and nauseated with these symptoms but it only lasts for a few seconds and passes He has no abdominal complaints. He has still not tried any nitroglycerin. He has been on Protonix 40 mg daily since his stents were placed and prior to that had taken Prevacid for 59 years for reflux.  He says he is concerned at this point because he is not sure what is causing his pain and when he should be worried      Review of Systems  Constitutional: Negative.   HENT: Negative.   Eyes: Negative.   Respiratory: Negative.   Cardiovascular: Positive for chest pain.  Gastrointestinal: Positive for nausea.  Genitourinary: Negative.   Musculoskeletal: Negative.   Skin: Negative.   Neurological: Negative.   Hematological: Negative.   Psychiatric/Behavioral: Negative.        Outpatient Prescriptions Prior to Visit  Medication Sig Dispense Refill  . aspirin 81 MG tablet Take 81 mg by mouth daily.        . Cholecalciferol (VITAMIN D3) 1000 UNITS CAPS Take 1 capsule by mouth daily.        Marland Kitchen Clopidogrel Bisulfate (PLAVIX PO) Take  75 mg by mouth daily.       . Coenzyme Q10 (CO Q-10) 200 MG CAPS Take 200 mg by mouth daily.        . fenofibrate (TRICOR) 145 MG tablet Take 1 tablet (145 mg total) by mouth daily.  30 tablet  11  . hydrocortisone (ANUSOL-HC) 25 MG suppository prn      . lisinopril (PRINIVIL,ZESTRIL) 5 MG tablet Take 1 tablet by mouth Daily.      . metoprolol (TOPROL XL) 50 MG 24 hr tablet Take 1 tablet (50 mg total) by mouth daily.  30 tablet  11  . NITROSTAT 0.4 MG SL tablet Take 1 tablet for chest pains every 5 minutes up to 3 doses      . traMADol (ULTRAM) 50 MG tablet  Take 1 tablet (50 mg total) by mouth 2 (two) times daily. Take 1 to 2 tabs by mouth two times a day as needed  120 tablet  2  . triamcinolone (KENALOG) 0.5 % cream Apply topically 2 (two) times daily as needed.        . vitamin B-12 (CYANOCOBALAMIN) 1000 MCG tablet Take 1,000 mcg by mouth daily.        . pantoprazole (PROTONIX) 40 MG tablet Take 1 tablet by mouth Daily.      . lansoprazole (PREVACID) 30 MG capsule Take 30 mg by mouth daily.        . tadalafil (CIALIS) 20 MG tablet Take 10 mg by mouth as needed. 1/2 or 1 by mouth every 1-3 days PRN       . vardenafil (LEVITRA) 20 MG tablet Take 20 mg by mouth daily as needed.         Allergies  Allergen Reactions  . Pravastatin Sodium     REACTION: gasy, constipated  . Rosuvastatin     REACTION: myalgias  . Simvastatin     REACTION: myalgia     Objective:   Physical Exam Well-developed white male in no acute distress, pleasant, alert and oriented x3 ,HEENT; nontraumatic normocephalic EOMI PERRLA sclera anicteric,Neck;Supple no JVD, Cardiovascular; regular rate and rhythm with S1-S2 no murmur rub or gallop appreciated, Pulmonary; clear bilaterally, Abdomen; large soft nontender nondistended bowel sounds active, no palpable mass or hepatosplenomegaly, Recta;l not done, Extremities; no clubbing cyanosis or edema he is status post right Iliofemoral Bypass, psych; mood and affect normal and  appropriate        Assessment & Plan:  #2 60 year old male with known GERD on chronic PPI therapy with intermittent sharp substernal chest pain x1 month. Recent admission and cardiac catheterization showing patent coronary stents with underlying coronary artery disease and he had that heavily calcified LAD. Rule out symptoms secondary to poorly controlled reflux, rule out gallbladder disease, rule out stable angina.  Plan: Increase Protonix to 40 mg by mouth twice daily before breakfast and before dinner Have reviewed an anti-reflux regimen and he will be  given an antireflux diet Schedule upper abdominal ultrasound After discussion with the patient will proceed with upper endoscopy diagnostic only with Dr.Kaplan. Patient advised that should he have any more severe or longer lasting symptoms that he should return to the emergency room for reevaluation, and also advised trial of nitroglycerin sublingual to see if any benefit. If no definitive GI diagnosis with the above procedures, he needs to return to Dr. Tenny Craw for further cardiac investigation.  Reviewed and agree with management. Carie Caddy. Pyrtle, MD

## 2011-06-30 NOTE — Patient Instructions (Signed)
We have sent a prescription for Protonix to [facility], [address] Take 1 tab twice daily. We scheduled the Endoscopy with Dr. Arlyce Dice on 07-21-2011. Directions and brochure provided. We scheduled the Ultrasound at [facility] for 07-06-2011. Directions given.

## 2011-06-30 NOTE — Telephone Encounter (Signed)
[name] wants to know when pt can start cardiac rehab.

## 2011-07-01 ENCOUNTER — Encounter (HOSPITAL_COMMUNITY): Payer: 59

## 2011-07-06 ENCOUNTER — Encounter: Payer: Self-pay | Admitting: Pulmonary Disease

## 2011-07-06 ENCOUNTER — Ambulatory Visit (INDEPENDENT_AMBULATORY_CARE_PROVIDER_SITE_OTHER): Payer: 59 | Admitting: Pulmonary Disease

## 2011-07-06 ENCOUNTER — Encounter (HOSPITAL_COMMUNITY): Payer: 59 | Attending: Internal Medicine

## 2011-07-06 ENCOUNTER — Other Ambulatory Visit (HOSPITAL_COMMUNITY): Payer: 59

## 2011-07-06 DIAGNOSIS — E785 Hyperlipidemia, unspecified: Secondary | ICD-10-CM | POA: Insufficient documentation

## 2011-07-06 DIAGNOSIS — Z9861 Coronary angioplasty status: Secondary | ICD-10-CM | POA: Insufficient documentation

## 2011-07-06 DIAGNOSIS — Z79899 Other long term (current) drug therapy: Secondary | ICD-10-CM | POA: Insufficient documentation

## 2011-07-06 DIAGNOSIS — F17200 Nicotine dependence, unspecified, uncomplicated: Secondary | ICD-10-CM | POA: Insufficient documentation

## 2011-07-06 DIAGNOSIS — Z7902 Long term (current) use of antithrombotics/antiplatelets: Secondary | ICD-10-CM | POA: Insufficient documentation

## 2011-07-06 DIAGNOSIS — G4733 Obstructive sleep apnea (adult) (pediatric): Secondary | ICD-10-CM

## 2011-07-06 DIAGNOSIS — I2510 Atherosclerotic heart disease of native coronary artery without angina pectoris: Secondary | ICD-10-CM | POA: Insufficient documentation

## 2011-07-06 DIAGNOSIS — Z5189 Encounter for other specified aftercare: Secondary | ICD-10-CM | POA: Insufficient documentation

## 2011-07-06 DIAGNOSIS — J449 Chronic obstructive pulmonary disease, unspecified: Secondary | ICD-10-CM | POA: Insufficient documentation

## 2011-07-06 DIAGNOSIS — I2119 ST elevation (STEMI) myocardial infarction involving other coronary artery of inferior wall: Secondary | ICD-10-CM | POA: Insufficient documentation

## 2011-07-06 DIAGNOSIS — J4489 Other specified chronic obstructive pulmonary disease: Secondary | ICD-10-CM | POA: Insufficient documentation

## 2011-07-06 DIAGNOSIS — I10 Essential (primary) hypertension: Secondary | ICD-10-CM | POA: Insufficient documentation

## 2011-07-06 DIAGNOSIS — Z7982 Long term (current) use of aspirin: Secondary | ICD-10-CM | POA: Insufficient documentation

## 2011-07-06 DIAGNOSIS — I70209 Unspecified atherosclerosis of native arteries of extremities, unspecified extremity: Secondary | ICD-10-CM | POA: Insufficient documentation

## 2011-07-06 DIAGNOSIS — G4730 Sleep apnea, unspecified: Secondary | ICD-10-CM | POA: Insufficient documentation

## 2011-07-06 NOTE — Assessment & Plan Note (Signed)
He has done well with CPAP.  He has been compliant with therapy and demonstrates benefit from therapy.  Encouraged him to maintain his compliance.  He is to follow up in one year, and call if he needs assistance earlier.

## 2011-07-06 NOTE — Patient Instructions (Signed)
Follow-up in one year.

## 2011-07-06 NOTE — Progress Notes (Signed)
Subjective:    Patient ID: [name], male    DOB: [birth year], 60 y.o.   MRN: [id number]  HPI  60 yo male with sleep apnea.  He has done well with CPAP.  He uses nasal pillows.  He is sleeping better, and feels more energetic during the day.  He is not snoring anymore.  He denies nose irritation from mask, or mouth dryness.  Past Medical History  Diagnosis Date  . Hiatal hernia   . Duodenitis   . Renal cyst   . Bell's palsy   . Hypercholesteremia   . Diverticulosis   . Cyst     sebacceous  . Renal cell carcinoma   . Bronchitis   . Low back pain   . Hypertension   . Hyperthyroidism   . Vitamin B12 deficiency   . PVD (peripheral vascular disease)     s/p right iliofem. bypass  . Pancreatitis   . GERD (gastroesophageal reflux disease)   . COPD (chronic obstructive pulmonary disease)   . Colon polyps   . Anxiety   . Coronary artery disease     a. s/p INF STEMI 4/12: DES x 2 (mid and dist RCA);  b. cath 4/12: oLAD 40%, mLAD 40%, pD1 30%, pOM1 40%, pOM3 50%, mRCA 80% (PCI), PDA 70% (med tx), PLB1 60%  (med tx); EF 55%  . Meralgia paresthetica     right  . Erectile dysfunction   . Chest pain, unspecified   . OSA (obstructive sleep apnea)     PSG 03/02/11>>AHI 28.6, SpO2 low 88%, CPAP 10 cm H2O.  Marland Kitchen Myocardial infarction 4/12    2 stents     Family History  Problem Relation Age of Onset  . Colon cancer Paternal Grandmother   . Cancer Paternal Grandmother     colon  . Hypertension    . Stroke Father   . Hypertension Mother      History   Social History  . Marital Status: Divorced   Occupational History  . business Production designer, theatre/television/film     [employer]   Social History Main Topics  . Smoking status: Former Smoker -- 1.0 packs/day for 40 years    Types: Cigarettes    Quit date: 02/15/2011  . Smokeless tobacco: Never Used   Comment: quit smoking in 2010 restarted 2011 1 ppd  . Alcohol Use: Yes  . Drug Use: No  . Sexually Active: Yes       Allergies  Allergen  Reactions  . Pravastatin Sodium     REACTION: gasy, constipated  . Rosuvastatin     REACTION: myalgias  . Simvastatin     REACTION: myalgia         Review of Systems     Objective:   Physical Exam BP 120/70  Pulse 88  Temp(Src) 98 F (36.7 C) (Oral)  Ht 5\' 10"  (1.778 m)  Wt 251 lb 12.8 oz (114.216 kg)  BMI 36.13 kg/m2  SpO2 98%  General - Obese, no distress  HEENT - PERRLA, EOMI, no sinus tenderness, clear sinus drainage, MP 4, elongated uvula, enlarged tongue, no exudate, no LAN, no thyromegaly  Cardiovascular - S1S2 regular, no murmur, peripheral pulses symmetric  Chest - no wheezing or rales, no dullness  Abd - Soft, nontender, no masses, normal bowel sounds  Ext - minimal ankle edema, no cyanosis or clubbing  Neuro - A&O x 3, normal strength, CN II to 12 intact   CPAP download 04/29/11 to 05/27/11>>Used on 23 of 30  nights with average 5hrs 23 min. Average AHI 2.5 with CPAP 10 cm H2O.    Assessment & Plan:  [place]   OSA (obstructive sleep apnea) He has done well with CPAP.  He has been compliant with therapy and demonstrates benefit from therapy.  Encouraged him to maintain his compliance.  He is to follow up in one year, and call if he needs assistance earlier.    Updated Medication List Outpatient Encounter Prescriptions as of 07/06/2011  Medication Sig Dispense Refill  . aspirin 81 MG tablet Take 81 mg by mouth daily.        . Cholecalciferol (VITAMIN D3) 1000 UNITS CAPS Take 1 capsule by mouth daily.        Marland Kitchen Clopidogrel Bisulfate (PLAVIX PO) Take 75 mg by mouth daily.       . Coenzyme Q10 (CO Q-10) 200 MG CAPS Take 200 mg by mouth daily.        . fenofibrate (TRICOR) 145 MG tablet Take 1 tablet (145 mg total) by mouth daily.  30 tablet  11  . hydrocortisone (ANUSOL-HC) 25 MG suppository prn      . lisinopril (PRINIVIL,ZESTRIL) 5 MG tablet Take 1 tablet by mouth Daily.      . metoprolol (TOPROL XL) 50 MG 24 hr tablet Take 1 tablet (50 mg total) by mouth  daily.  30 tablet  11  . NITROSTAT 0.4 MG SL tablet Take 1 tablet for chest pains every 5 minutes up to 3 doses      . pantoprazole (PROTONIX) 40 MG tablet Take 1 tab twice daily, 30 min before breakfast and dinner.  60 tablet  3  . traMADol (ULTRAM) 50 MG tablet Take 1 tablet (50 mg total) by mouth 2 (two) times daily. Take 1 to 2 tabs by mouth two times a day as needed  120 tablet  2  . triamcinolone (KENALOG) 0.5 % cream Apply topically 2 (two) times daily as needed.        . vitamin B-12 (CYANOCOBALAMIN) 1000 MCG tablet Take 1,000 mcg by mouth daily.

## 2011-07-08 ENCOUNTER — Encounter (HOSPITAL_COMMUNITY): Payer: 59

## 2011-07-11 ENCOUNTER — Encounter (HOSPITAL_COMMUNITY): Payer: 59

## 2011-07-13 ENCOUNTER — Encounter (HOSPITAL_COMMUNITY): Payer: 59

## 2011-07-15 ENCOUNTER — Encounter (HOSPITAL_COMMUNITY): Payer: 59

## 2011-07-21 ENCOUNTER — Encounter: Payer: Self-pay | Admitting: Gastroenterology

## 2011-07-21 ENCOUNTER — Ambulatory Visit (AMBULATORY_SURGERY_CENTER): Payer: 59 | Admitting: Gastroenterology

## 2011-07-21 VITALS — BP 121/81 | HR 72 | Temp 97.7°F | Resp 20 | Ht 70.0 in | Wt 250.0 lb

## 2011-07-21 DIAGNOSIS — K219 Gastro-esophageal reflux disease without esophagitis: Secondary | ICD-10-CM

## 2011-07-21 DIAGNOSIS — R079 Chest pain, unspecified: Secondary | ICD-10-CM

## 2011-07-21 MED ORDER — SODIUM CHLORIDE 0.9 % IV SOLN: 500.0000 mL | INTRAVENOUS | Status: DC

## 2011-07-21 MED ORDER — SUCRALFATE 1 GM/10ML PO SUSP: 1.0000 g | Freq: Four times a day (QID) | ORAL | Status: DC

## 2011-07-21 NOTE — Patient Instructions (Signed)
FOLLOW DISCHARGE INSTRUCTIONS (BLUE & GREEN SHEETS)   PICK UP CARAFATE @ YOUR PHARMACY , SENT IN BY DR KAPLAN  CONTINUE CURRENT MEDICATIONS INCLUDING PROTONIX TWICE DAILY TO HEAL EROSIONS.

## 2011-07-22 ENCOUNTER — Telehealth: Payer: Self-pay

## 2011-07-22 NOTE — Telephone Encounter (Signed)

## 2011-07-26 ENCOUNTER — Telehealth: Payer: Self-pay

## 2011-07-26 ENCOUNTER — Ambulatory Visit (HOSPITAL_COMMUNITY)
Admission: RE | Admit: 2011-07-26 | Discharge: 2011-07-26 | Disposition: A | Discharge status: Home / Self Care | Payer: 59 | Source: Ambulatory Visit | Attending: Physician Assistant | Admitting: Physician Assistant

## 2011-07-26 DIAGNOSIS — R109 Unspecified abdominal pain: Secondary | ICD-10-CM | POA: Insufficient documentation

## 2011-07-26 DIAGNOSIS — K7689 Other specified diseases of liver: Secondary | ICD-10-CM | POA: Insufficient documentation

## 2011-07-26 DIAGNOSIS — R110 Nausea: Secondary | ICD-10-CM | POA: Insufficient documentation

## 2011-07-26 DIAGNOSIS — Q619 Cystic kidney disease, unspecified: Secondary | ICD-10-CM | POA: Insufficient documentation

## 2011-07-26 NOTE — Telephone Encounter (Signed)
Message copied by Michele Mcalpine on Tue Jul 26, 2011  1:17 PM ------      Message from: Mike Gip S      Created: Tue Jul 26, 2011 12:42 PM       Please let Raman know his US shows no gallstones. He does have bilateral renal cysts

## 2011-07-26 NOTE — Telephone Encounter (Signed)
Pt aware.

## 2011-08-02 ENCOUNTER — Other Ambulatory Visit (INDEPENDENT_AMBULATORY_CARE_PROVIDER_SITE_OTHER): Payer: 59

## 2011-08-02 DIAGNOSIS — I252 Old myocardial infarction: Secondary | ICD-10-CM

## 2011-08-02 DIAGNOSIS — E291 Testicular hypofunction: Secondary | ICD-10-CM

## 2011-08-02 DIAGNOSIS — R5381 Other malaise: Secondary | ICD-10-CM

## 2011-08-02 LAB — COMPREHENSIVE METABOLIC PANEL
ALT: 21 U/L (ref 0–53)
AST: 21 U/L (ref 0–37)
Alkaline Phosphatase: 68 U/L (ref 39–117)
Calcium: 9.5 mg/dL (ref 8.4–10.5)
Chloride: 106 mEq/L (ref 96–112)
Creatinine, Ser: 0.9 mg/dL (ref 0.4–1.5)
Potassium: 4 mEq/L (ref 3.5–5.1)

## 2011-08-02 LAB — LIPID PANEL
HDL: 28.8 mg/dL — ABNORMAL LOW (ref >39.00)
LDL Cholesterol: 122 mg/dL — ABNORMAL HIGH (ref 0–99)
Total CHOL/HDL Ratio: 6

## 2011-08-02 LAB — TESTOSTERONE: Testosterone: 354.88 ng/dL (ref 350.00–890.00)

## 2011-08-05 ENCOUNTER — Encounter: Payer: Self-pay | Admitting: Internal Medicine

## 2011-08-05 ENCOUNTER — Ambulatory Visit (INDEPENDENT_AMBULATORY_CARE_PROVIDER_SITE_OTHER): Payer: 59 | Admitting: Internal Medicine

## 2011-08-05 DIAGNOSIS — N529 Male erectile dysfunction, unspecified: Secondary | ICD-10-CM

## 2011-08-05 DIAGNOSIS — G4733 Obstructive sleep apnea (adult) (pediatric): Secondary | ICD-10-CM

## 2011-08-05 DIAGNOSIS — M545 Low back pain, unspecified: Secondary | ICD-10-CM

## 2011-08-05 DIAGNOSIS — E291 Testicular hypofunction: Secondary | ICD-10-CM

## 2011-08-05 DIAGNOSIS — E538 Deficiency of other specified B group vitamins: Secondary | ICD-10-CM

## 2011-08-05 DIAGNOSIS — E7800 Pure hypercholesterolemia, unspecified: Secondary | ICD-10-CM

## 2011-08-05 DIAGNOSIS — I2510 Atherosclerotic heart disease of native coronary artery without angina pectoris: Secondary | ICD-10-CM

## 2011-08-05 DIAGNOSIS — I10 Essential (primary) hypertension: Secondary | ICD-10-CM

## 2011-08-05 MED ORDER — LISINOPRIL 5 MG PO TABS: 5.0000 mg | ORAL_TABLET | Freq: Two times a day (BID) | ORAL | Status: DC

## 2011-08-05 MED ORDER — METOPROLOL SUCCINATE ER 50 MG PO TB24: 50.0000 mg | ORAL_TABLET | Freq: Every day | ORAL | Status: DC

## 2011-08-05 MED ORDER — SILDENAFIL CITRATE 100 MG PO TABS: 100.0000 mg | ORAL_TABLET | Freq: Every day | ORAL | Status: DC | PRN

## 2011-08-05 NOTE — Assessment & Plan Note (Signed)
Not on rx 

## 2011-08-05 NOTE — Assessment & Plan Note (Signed)
Tramadol prn 

## 2011-08-05 NOTE — Assessment & Plan Note (Signed)
Continue with current prescription therapy as reflected on the Med list.  

## 2011-08-05 NOTE — Assessment & Plan Note (Signed)
Cont w/CPAP 

## 2011-08-05 NOTE — Assessment & Plan Note (Signed)
He just started Tricor - Dr Tenny Craw

## 2011-08-05 NOTE — Assessment & Plan Note (Signed)
Start Viagra prescription therapy as reflected on the Med list. Not w/NTG - he is aware (ok w/cardiol)

## 2011-08-05 NOTE — Assessment & Plan Note (Signed)
Increase Lisinopril to bid 

## 2011-08-05 NOTE — Progress Notes (Signed)
Subjective:    Patient ID: [name], male    DOB: 1951-03-19, 60 y.o.   MRN: [id number]  HPI  The patient presents for a follow-up of  chronic hypertension, chronic dyslipidemia, PVD, CAD controlled with medicines. He started Tricor. C/o ED  Wt Readings from Last 3 Encounters:  08/05/11 255 lb (115.667 kg)  07/21/11 250 lb (113.399 kg)  07/06/11 251 lb 12.8 oz (114.216 kg)     Review of Systems  Constitutional: Negative for appetite change, fatigue and unexpected weight change.  HENT: Negative for nosebleeds, congestion, sore throat, sneezing, trouble swallowing and neck pain.   Eyes: Negative for itching and visual disturbance.  Respiratory: Negative for cough.   Cardiovascular: Negative for chest pain, palpitations and leg swelling.  Gastrointestinal: Negative for nausea, diarrhea, blood in stool and abdominal distention.  Genitourinary: Negative for frequency and hematuria.  Musculoskeletal: Negative for back pain, joint swelling and gait problem.  Skin: Negative for rash.  Neurological: Negative for dizziness, tremors, speech difficulty and weakness.  Psychiatric/Behavioral: Negative for sleep disturbance, dysphoric mood and agitation. The patient is not nervous/anxious.        Objective:   Physical Exam  Constitutional: He is oriented to person, place, and time. He appears well-developed.       Obese  HENT:  Mouth/Throat: Oropharynx is clear and moist.  Eyes: Conjunctivae are normal. Pupils are equal, round, and reactive to light.  Neck: Normal range of motion. No JVD present. No thyromegaly present.  Cardiovascular: Normal rate, regular rhythm, normal heart sounds and intact distal pulses.  Exam reveals no gallop and no friction rub.   No murmur heard. Pulmonary/Chest: Effort normal and breath sounds normal. No respiratory distress. He has no wheezes. He has no rales. He exhibits no tenderness.  Abdominal: Soft. Bowel sounds are normal. He exhibits no  distension and no mass. There is no tenderness. There is no rebound and no guarding.  Musculoskeletal: Normal range of motion. He exhibits no edema and no tenderness.  Lymphadenopathy:    He has no cervical adenopathy.  Neurological: He is alert and oriented to person, place, and time. He has normal reflexes. No cranial nerve deficit. He exhibits normal muscle tone. Coordination normal.  Skin: Skin is warm and dry. No rash noted.  Psychiatric: He has a normal mood and affect. His behavior is normal. Judgment and thought content normal.    Lab Results  Component Value Date   WBC 8.5 06/08/2011   HGB 13.9 06/08/2011   HCT 41.4 06/08/2011   PLT 209 06/08/2011   GLUCOSE 100* 08/02/2011   CHOL 175 08/02/2011   TRIG 119.0 08/02/2011   HDL 28.80* 08/02/2011   LDLDIRECT 135.3 11/03/2010   LDLCALC 122* 08/02/2011   ALT 21 08/02/2011   AST 21 08/02/2011   NA 140 08/02/2011   K 4.0 08/02/2011   CL 106 08/02/2011   CREATININE 0.9 08/02/2011   BUN 17 08/02/2011   CO2 27 08/02/2011   TSH 1.65 08/02/2011   PSA 0.48 11/03/2010   INR 1.02 06/06/2011   HGBA1C  Value: 5.4 (NOTE)  According to the ADA Clinical Practice Recommendations for 2011, when HbA1c is used as a screening test:   >=6.5%   Diagnostic of Diabetes Mellitus           (if abnormal result  is confirmed)  5.7-6.4%   Increased risk of developing Diabetes Mellitus  References:Diagnosis and Classification of Diabetes Mellitus,Diabetes Care,2011,34(Suppl 1):S62-S69 and Standards of Medical Care in         Diabetes - 2011,Diabetes Care,2011,34  (Suppl 1):S11-S61. 02/13/2011         Assessment & Plan:

## 2011-08-23 ENCOUNTER — Ambulatory Visit: Payer: 59 | Admitting: Gastroenterology

## 2011-09-07 ENCOUNTER — Telehealth: Payer: Self-pay

## 2011-09-07 ENCOUNTER — Ambulatory Visit (INDEPENDENT_AMBULATORY_CARE_PROVIDER_SITE_OTHER): Payer: 59 | Admitting: Gastroenterology

## 2011-09-07 ENCOUNTER — Encounter: Payer: Self-pay | Admitting: Gastroenterology

## 2011-09-07 VITALS — BP 124/68 | HR 72 | Ht 70.0 in | Wt 257.0 lb

## 2011-09-07 DIAGNOSIS — R079 Chest pain, unspecified: Secondary | ICD-10-CM

## 2011-09-07 MED ORDER — SUCRALFATE 1 GM/10ML PO SUSP: 1.0000 g | Freq: Four times a day (QID) | ORAL | Status: DC

## 2011-09-07 MED ORDER — PANTOPRAZOLE SODIUM 40 MG PO TBEC: 40.0000 mg | DELAYED_RELEASE_TABLET | ORAL | Status: DC

## 2011-09-07 NOTE — Telephone Encounter (Signed)
Spoke with Irving Burton at [facility] to confirm her confusion about rx for Protonix - Pt to take 1 tab twice a day.

## 2011-09-07 NOTE — Progress Notes (Signed)
[name] has returned for followup of chest pain. Upper endoscopy demonstrated grade a erosive esophagitis.  Incidental benign appearing gastric polyps were seen as well. On a regimen of Carafate slurry and protonix  episodes of chest pain  have decreased significantly. Since running out of Carafate he has occasional burning mid chest discomfort which is reduced by taking liquid antacid.

## 2011-09-07 NOTE — Patient Instructions (Signed)
We will renew medications today Follow up as needed

## 2011-09-07 NOTE — Assessment & Plan Note (Signed)
He is experiencing mild chest pain that's responsive to antacids. This is likely due to acid reflux.  Recommendations #1 resume Carafate slurry #2 continue Protonix but this can be reduced to once daily

## 2011-09-19 ENCOUNTER — Other Ambulatory Visit: Payer: Self-pay | Admitting: Physician Assistant

## 2011-10-07 ENCOUNTER — Encounter: Payer: Self-pay | Admitting: Internal Medicine

## 2011-10-07 ENCOUNTER — Ambulatory Visit (INDEPENDENT_AMBULATORY_CARE_PROVIDER_SITE_OTHER): Payer: 59 | Admitting: Internal Medicine

## 2011-10-07 VITALS — BP 124/66 | HR 70 | Ht 70.0 in | Wt 259.0 lb

## 2011-10-07 DIAGNOSIS — I10 Essential (primary) hypertension: Secondary | ICD-10-CM

## 2011-10-07 DIAGNOSIS — E785 Hyperlipidemia, unspecified: Secondary | ICD-10-CM

## 2011-10-07 DIAGNOSIS — I2510 Atherosclerotic heart disease of native coronary artery without angina pectoris: Secondary | ICD-10-CM

## 2011-10-07 MED ORDER — NITROGLYCERIN 0.4 MG SL SUBL: 0.4000 mg | SUBLINGUAL_TABLET | SUBLINGUAL | Status: DC | PRN

## 2011-10-07 NOTE — Progress Notes (Signed)
HPI [name] is a 60 y.o. male with a history of CAD.  He is s/p IWMI in April 2012.  Cath showed 80% then 100% RCA.  Both lesions were treated with Promuse stens.  He had moderated residual disease of the PDA and PLSA.  LVEF was 55% He also has a history of COPD, PAD, hypertension, hyperlipidemia and sleep apnea (wears CPAP) He was last seen in the summer. He finished cardiac rehab.  After that he says he becam less active.  He also gained some weight.  Says he does get some SOB with activity which he attrib to his increased weight. Denies CP.   Off of statins due to SE.     Allergies  Allergen Reactions  . Pravastatin Sodium     REACTION: gasy, constipated  . Rosuvastatin     REACTION: myalgias  . Simvastatin     REACTION: myalgia    Current Outpatient Prescriptions  Medication Sig Dispense Refill  . aspirin 81 MG tablet Take 81 mg by mouth daily.        . Cholecalciferol (VITAMIN D3) 1000 UNITS CAPS Take 1 capsule by mouth daily.        . clopidogrel (PLAVIX) 75 MG tablet TAKE 1 TABLET BY MOUTH DAILY WITH A MEAL  30 tablet  6  . Coenzyme Q10 (CO Q-10) 200 MG CAPS Take 200 mg by mouth daily.        . fenofibrate (TRICOR) 145 MG tablet Take 1 tablet (145 mg total) by mouth daily.  30 tablet  11  . hydrocortisone (ANUSOL-HC) 25 MG suppository prn      . lisinopril (PRINIVIL,ZESTRIL) 5 MG tablet Take 5 mg by mouth daily.        . metoprolol (TOPROL XL) 50 MG 24 hr tablet Take 1 tablet (50 mg total) by mouth daily. Take at hs  30 tablet  11  . NITROSTAT 0.4 MG SL tablet Take 1 tablet for chest pains every 5 minutes up to 3 doses      . pantoprazole (PROTONIX) 40 MG tablet Take 1 tablet (40 mg total) by mouth 1 day or 1 dose. Take 1 tab twice daily, 30 min before breakfast and dinner.  60 tablet  3  . sildenafil (VIAGRA) 100 MG tablet Take 1 tablet (100 mg total) by mouth daily as needed for erectile dysfunction.  12 tablet  5  . sucralfate (CARAFATE) 1 GM/10ML suspension  Take 10 mLs (1 g total) by mouth 4 (four) times daily.  420 mL  5  . traMADol (ULTRAM) 50 MG tablet Take 1 tablet (50 mg total) by mouth 2 (two) times daily. Take 1 to 2 tabs by mouth two times a day as needed  120 tablet  2  . triamcinolone (KENALOG) 0.5 % cream Apply topically 2 (two) times daily as needed.        . vitamin B-12 (CYANOCOBALAMIN) 1000 MCG tablet Take 1,000 mcg by mouth daily.        Marland Kitchen DISCONTD: lisinopril (PRINIVIL,ZESTRIL) 5 MG tablet Take 1 tablet (5 mg total) by mouth 2 (two) times daily.  60 tablet  11    Past Medical History  Diagnosis Date  . Hiatal hernia   . Duodenitis   . Renal cyst   . Bell's palsy   . Hypercholesteremia   . Diverticulosis   . Cyst     sebacceous  . Renal cell carcinoma   . Bronchitis   . Low back pain   .  Hypertension   . Hyperthyroidism   . Vitamin B12 deficiency   . PVD (peripheral vascular disease)     s/p right iliofem. bypass  . Pancreatitis   . GERD (gastroesophageal reflux disease)   . COPD (chronic obstructive pulmonary disease)   . Colon polyps   . Anxiety   . Coronary artery disease     a. s/p INF STEMI 4/12: DES x 2 (mid and dist RCA);  b. cath 4/12: oLAD 40%, mLAD 40%, pD1 30%, pOM1 40%, pOM3 50%, mRCA 80% (PCI), PDA 70% (med tx), PLB1 60%  (med tx); EF 55%  . Meralgia paresthetica     right  . Erectile dysfunction   . Chest pain, unspecified   . OSA (obstructive sleep apnea)     PSG 03/02/11>>AHI 28.6, SpO2 low 88%, CPAP 10 cm H2O.  Marland Kitchen Myocardial infarction 4/12    2 stents    Past Surgical History  Procedure Date  . Bypass graft     Rt. iliofemoral   . Inguinal hernia repair     Rt.  . Tumor removal     Cryo Rena  . Colonoscopy   . Upper gastrointestinal endoscopy     Family History  Problem Relation Age of Onset  . Colon cancer Paternal Grandmother   . Cancer Paternal Grandmother     colon  . Hypertension    . Stroke Father   . Hypertension Mother     History   Social History  . Marital  Status: Divorced    Spouse Name: N/A    Number of Children: N/A  . Years of Education: N/A   Occupational History  . business Production designer, theatre/television/film     [employer]   Social History Main Topics  . Smoking status: Passive Smoker -- 1.0 packs/day for 40 years    Types: Cigarettes    Last Attempt to Quit: 02/15/2011  . Smokeless tobacco: Never Used   Comment: quit smoking in 2010 restarted 2011 1 ppd  . Alcohol Use: Yes     occasionally  . Drug Use: No  . Sexually Active: Yes   Other Topics Concern  . Not on file   Social History Narrative  . No narrative on file    Review of Systems:  All systems reviewed.  They are negative to the above problem except as previously stated.  Vital Signs: BP 124/66  Pulse 70  Ht 5\' 10"  (1.778 m)  Wt 259 lb (117.482 kg)  BMI 37.16 kg/m2  Physical Exam  Patient is in NAD. HEENT:  Normocephalic, atraumatic. EOMI, PERRLA.  Neck: JVP is normal. No bruits.  Lungs: clear to auscultation. No rales no wheezes.  Heart: Regular rate and rhythm. Normal S1, S2. No S3.   No significant murmurs. PMI not displaced.  Abdomen:  Supple, nontender. Normal bowel sounds. No masses. No hepatomegaly.  Extremities:   Good distal pulses throughout. No lower extremity edema.  Musculoskeletal :moving all extremities.  Neuro:   alert and oriented x3.  CN II-XII grossly intact.  EKG:  NSR.  70 bpm.  Inferior MI  Possible anterolateral MI.  T wav inversion III.  Assessment and Plan:

## 2011-10-09 DIAGNOSIS — E785 Hyperlipidemia, unspecified: Secondary | ICD-10-CM | POA: Insufficient documentation

## 2011-10-09 NOTE — Assessment & Plan Note (Signed)
Patient without angina.  I am not convinced his SOB is an anginal equivalent.  I have encouraged him to get back in exercise routing and try to lose some weight. Call in symptoms worsen.

## 2011-10-09 NOTE — Assessment & Plan Note (Signed)
WIll need to review with pharmacy.

## 2011-10-09 NOTE — Assessment & Plan Note (Signed)
BP is better today.  Continue meds.

## 2011-11-16 ENCOUNTER — Ambulatory Visit: Payer: 59 | Admitting: Internal Medicine

## 2012-01-17 ENCOUNTER — Telehealth: Payer: Self-pay | Admitting: Internal Medicine

## 2012-01-17 DIAGNOSIS — E782 Mixed hyperlipidemia: Secondary | ICD-10-CM

## 2012-01-17 DIAGNOSIS — I2510 Atherosclerotic heart disease of native coronary artery without angina pectoris: Secondary | ICD-10-CM

## 2012-01-17 MED ORDER — METOPROLOL SUCCINATE ER 25 MG PO TB24: 25.0000 mg | ORAL_TABLET | Freq: Every day | ORAL | Status: DC

## 2012-01-17 MED ORDER — CLOPIDOGREL BISULFATE 75 MG PO TABS: 75.0000 mg | ORAL_TABLET | Freq: Every day | ORAL | Status: DC

## 2012-01-17 MED ORDER — LISINOPRIL 5 MG PO TABS: 5.0000 mg | ORAL_TABLET | Freq: Every day | ORAL | Status: DC

## 2012-01-17 NOTE — Telephone Encounter (Signed)
Addended by: Worthy Rancher D on: 01/17/2012 09:42 AM   Modules accepted: Orders

## 2012-01-17 NOTE — Telephone Encounter (Signed)
New msg Pt wants to review his meds because his insurance has changed. Please call

## 2012-01-17 NOTE — Telephone Encounter (Signed)
Pt requesting a 90 day supply be sent to [facility] on [address] due to new insurance.  He wanted Dr Tenny Craw to look at his labs and decide if he needs to stay on the fenofibrate?  He states there was some question about it at his last visit.  He also wants to know if Dr Tenny Craw will be refilling his pantoprazole or if it needs to be done by his gastric physician or pcp?

## 2012-01-18 MED ORDER — PANTOPRAZOLE SODIUM 40 MG PO TBEC: 40.0000 mg | DELAYED_RELEASE_TABLET | Freq: Every day | ORAL | Status: DC

## 2012-01-18 MED ORDER — PITAVASTATIN CALCIUM 1 MG PO TABS: 1.0000 mg | ORAL_TABLET | Freq: Every day | ORAL | Status: DC

## 2012-01-18 MED ORDER — FENOFIBRATE 145 MG PO TABS: 145.0000 mg | ORAL_TABLET | Freq: Every day | ORAL | Status: DC

## 2012-01-18 NOTE — Telephone Encounter (Signed)
I would keep on fenofibrate. Would he be willing to try 1 more statin.  Livalo 1mg  3x per week.  F/U lipids and AST and CT in 8 wks.

## 2012-01-18 NOTE — Telephone Encounter (Signed)
Called patient and advised to stay on fenofibrate and start LIVALO 1mg  3 times per week per Dr.Ross. Will mail coupon with script to his home address.

## 2012-01-18 NOTE — Telephone Encounter (Addendum)
Addended by: Vickie Epley on: 01/18/2012 05:20 PM  Modules accepted: Orders He will call me back to schedule lab work after 2 months on new medication

## 2012-01-31 ENCOUNTER — Other Ambulatory Visit (INDEPENDENT_AMBULATORY_CARE_PROVIDER_SITE_OTHER): Payer: 59

## 2012-01-31 DIAGNOSIS — E7800 Pure hypercholesterolemia, unspecified: Secondary | ICD-10-CM

## 2012-01-31 DIAGNOSIS — R5383 Other fatigue: Secondary | ICD-10-CM

## 2012-01-31 DIAGNOSIS — R5381 Other malaise: Secondary | ICD-10-CM

## 2012-01-31 DIAGNOSIS — E538 Deficiency of other specified B group vitamins: Secondary | ICD-10-CM

## 2012-01-31 LAB — CBC WITH DIFFERENTIAL/PLATELET
Basophils Relative: 0.5 % (ref 0.0–3.0)
Eosinophils Absolute: 0.2 10*3/uL (ref 0.0–0.7)
HCT: 48.4 % (ref 39.0–52.0)
Hemoglobin: 16.3 g/dL (ref 13.0–17.0)
Lymphs Abs: 2.4 10*3/uL (ref 0.7–4.0)
MCHC: 33.6 g/dL (ref 30.0–36.0)
MCV: 93.6 fl (ref 78.0–100.0)
Monocytes Absolute: 0.7 10*3/uL (ref 0.1–1.0)
Neutro Abs: 4.7 10*3/uL (ref 1.4–7.7)
Neutrophils Relative %: 58.5 % (ref 43.0–77.0)
RBC: 5.18 Mil/uL (ref 4.22–5.81)

## 2012-01-31 LAB — LIPID PANEL
Cholesterol: 193 mg/dL (ref 0–200)
Total CHOL/HDL Ratio: 8
VLDL: 66 mg/dL — ABNORMAL HIGH (ref 0.0–40.0)

## 2012-01-31 LAB — HEPATIC FUNCTION PANEL
AST: 19 U/L (ref 0–37)
Bilirubin, Direct: 0.1 mg/dL (ref 0.0–0.3)
Total Bilirubin: 0.1 mg/dL — ABNORMAL LOW (ref 0.3–1.2)

## 2012-01-31 LAB — BASIC METABOLIC PANEL
BUN: 21 mg/dL (ref 6–23)
GFR: 88.18 mL/min (ref >60.00)
Potassium: 4.7 mEq/L (ref 3.5–5.1)
Sodium: 139 mEq/L (ref 135–145)

## 2012-02-03 ENCOUNTER — Encounter: Payer: Self-pay | Admitting: Internal Medicine

## 2012-02-03 ENCOUNTER — Ambulatory Visit (INDEPENDENT_AMBULATORY_CARE_PROVIDER_SITE_OTHER): Payer: 59 | Admitting: Internal Medicine

## 2012-02-03 VITALS — BP 130/86 | HR 84 | Temp 98.4°F | Resp 16 | Wt 265.0 lb

## 2012-02-03 DIAGNOSIS — E538 Deficiency of other specified B group vitamins: Secondary | ICD-10-CM

## 2012-02-03 DIAGNOSIS — D751 Secondary polycythemia: Secondary | ICD-10-CM

## 2012-02-03 DIAGNOSIS — G4733 Obstructive sleep apnea (adult) (pediatric): Secondary | ICD-10-CM

## 2012-02-03 DIAGNOSIS — I10 Essential (primary) hypertension: Secondary | ICD-10-CM

## 2012-02-03 DIAGNOSIS — E0590 Thyrotoxicosis, unspecified without thyrotoxic crisis or storm: Secondary | ICD-10-CM

## 2012-02-03 DIAGNOSIS — F17200 Nicotine dependence, unspecified, uncomplicated: Secondary | ICD-10-CM

## 2012-02-03 DIAGNOSIS — E7800 Pure hypercholesterolemia, unspecified: Secondary | ICD-10-CM

## 2012-02-03 MED ORDER — LOSARTAN POTASSIUM 100 MG PO TABS: 100.0000 mg | ORAL_TABLET | Freq: Every day | ORAL | Status: DC

## 2012-02-03 NOTE — Assessment & Plan Note (Signed)
Diccussed

## 2012-02-03 NOTE — Assessment & Plan Note (Signed)
Continue with current prescription therapy as reflected on the Med list.  

## 2012-02-03 NOTE — Progress Notes (Signed)
Patient ID: [name], male   DOB: 07/23/1951, 61 y.o.   MRN: [id number]  Subjective:    Patient ID: [name], male    DOB: Oct 05, 1951, 61 y.o.   MRN: [id number]  Arm Pain  Pertinent negatives include no chest pain.    The patient presents for a follow-up of  chronic hypertension, chronic dyslipidemia, PVD, CAD controlled with medicines. He started Tricor. C/o ED  Wt Readings from Last 3 Encounters:  02/03/12 265 lb (120.203 kg)  10/07/11 259 lb (117.482 kg)  09/07/11 257 lb (116.574 kg)   BP Readings from Last 3 Encounters:  02/03/12 130/86  10/07/11 124/66  09/07/11 124/68     Review of Systems  Constitutional: Negative for appetite change, fatigue and unexpected weight change.  HENT: Negative for nosebleeds, congestion, sore throat, sneezing, trouble swallowing and neck pain.   Eyes: Negative for itching and visual disturbance.  Respiratory: Negative for cough.   Cardiovascular: Negative for chest pain, palpitations and leg swelling.  Gastrointestinal: Negative for nausea, diarrhea, blood in stool and abdominal distention.  Genitourinary: Negative for frequency and hematuria.  Musculoskeletal: Negative for back pain, joint swelling and gait problem.  Skin: Negative for rash.  Neurological: Negative for dizziness, tremors, speech difficulty and weakness.  Psychiatric/Behavioral: Negative for sleep disturbance, dysphoric mood and agitation. The patient is not nervous/anxious.        Objective:   Physical Exam  Constitutional: He is oriented to person, place, and time. He appears well-developed.       Obese  HENT:  Mouth/Throat: Oropharynx is clear and moist.  Eyes: Conjunctivae are normal. Pupils are equal, round, and reactive to light.  Neck: Normal range of motion. No JVD present. No thyromegaly present.  Cardiovascular: Normal rate, regular rhythm, normal heart sounds and intact distal pulses.  Exam reveals no gallop and no friction rub.   No murmur  heard. Pulmonary/Chest: Effort normal and breath sounds normal. No respiratory distress. He has no wheezes. He has no rales. He exhibits no tenderness.  Abdominal: Soft. Bowel sounds are normal. He exhibits no distension and no mass. There is no tenderness. There is no rebound and no guarding.  Musculoskeletal: Normal range of motion. He exhibits no edema and no tenderness.  Lymphadenopathy:    He has no cervical adenopathy.  Neurological: He is alert and oriented to person, place, and time. He has normal reflexes. No cranial nerve deficit. He exhibits normal muscle tone. Coordination normal.  Skin: Skin is warm and dry. No rash noted.  Psychiatric: He has a normal mood and affect. His behavior is normal. Judgment and thought content normal.    Lab Results  Component Value Date   WBC 8.0 01/31/2012   HGB 16.3 01/31/2012   HCT 48.4 01/31/2012   PLT 226.0 01/31/2012   GLUCOSE 90 01/31/2012   CHOL 193 01/31/2012   TRIG 330.0* 01/31/2012   HDL 24.70* 01/31/2012   LDLDIRECT 121.4 01/31/2012   LDLCALC 122* 08/02/2011   ALT 22 01/31/2012   AST 19 01/31/2012   NA 139 01/31/2012   K 4.7 01/31/2012   CL 104 01/31/2012   CREATININE 0.9 01/31/2012   BUN 21 01/31/2012   CO2 28 01/31/2012   TSH 1.65 08/02/2011   PSA 0.48 11/03/2010   INR 1.02 06/06/2011   HGBA1C  Value: 5.4 (NOTE)  According to the ADA Clinical Practice Recommendations for 2011, when HbA1c is used as a screening test:   >=6.5%   Diagnostic of Diabetes Mellitus           (if abnormal result  is confirmed)  5.7-6.4%   Increased risk of developing Diabetes Mellitus  References:Diagnosis and Classification of Diabetes Mellitus,Diabetes Care,2011,34(Suppl 1):S62-S69 and Standards of Medical Care in         Diabetes - 2011,Diabetes Care,2011,34  (Suppl 1):S11-S61. 02/13/2011         Assessment & Plan:

## 2012-02-03 NOTE — Assessment & Plan Note (Signed)
2013 - he restarted smoking Using CPAP

## 2012-02-03 NOTE — Assessment & Plan Note (Signed)
Chronic. 

## 2012-02-03 NOTE — Progress Notes (Deleted)
  Subjective:    Patient ID: [name], male    DOB: [birth year], 61 y.o.   MRN: [id number]  HPI    Review of Systems     Objective:   Physical Exam        Assessment & Plan:

## 2012-03-12 ENCOUNTER — Other Ambulatory Visit: Payer: Self-pay | Admitting: Internal Medicine

## 2012-03-12 MED ORDER — PITAVASTATIN CALCIUM 1 MG PO TABS: 1.0000 mg | ORAL_TABLET | Freq: Every day | ORAL | Status: DC

## 2012-04-30 DIAGNOSIS — C966 Unifocal Langerhans-cell histiocytosis: Secondary | ICD-10-CM

## 2012-04-30 HISTORY — DX: Unifocal Langerhans-cell histiocytosis: C96.6

## 2012-04-30 HISTORY — PX: VIDEO ASSISTED THORACOSCOPY: SHX5073

## 2012-05-08 ENCOUNTER — Other Ambulatory Visit: Payer: Self-pay | Admitting: *Deleted

## 2012-05-08 DIAGNOSIS — R7309 Other abnormal glucose: Secondary | ICD-10-CM

## 2012-05-08 DIAGNOSIS — E7800 Pure hypercholesterolemia, unspecified: Secondary | ICD-10-CM

## 2012-05-08 DIAGNOSIS — I10 Essential (primary) hypertension: Secondary | ICD-10-CM

## 2012-05-09 ENCOUNTER — Other Ambulatory Visit (INDEPENDENT_AMBULATORY_CARE_PROVIDER_SITE_OTHER): Payer: 59

## 2012-05-09 DIAGNOSIS — R7309 Other abnormal glucose: Secondary | ICD-10-CM

## 2012-05-09 DIAGNOSIS — E7800 Pure hypercholesterolemia, unspecified: Secondary | ICD-10-CM

## 2012-05-09 DIAGNOSIS — I10 Essential (primary) hypertension: Secondary | ICD-10-CM

## 2012-05-09 LAB — COMPREHENSIVE METABOLIC PANEL
ALT: 18 U/L (ref 0–53)
AST: 21 U/L (ref 0–37)
Albumin: 3.9 g/dL (ref 3.5–5.2)
Calcium: 9.3 mg/dL (ref 8.4–10.5)
Chloride: 104 mEq/L (ref 96–112)
Creatinine, Ser: 0.9 mg/dL (ref 0.4–1.5)
Potassium: 3.9 mEq/L (ref 3.5–5.1)

## 2012-05-09 LAB — HEMOGLOBIN A1C: Hgb A1c MFr Bld: 5.7 % (ref 4.6–6.5)

## 2012-05-09 LAB — LIPID PANEL: Total CHOL/HDL Ratio: 7

## 2012-05-10 ENCOUNTER — Ambulatory Visit (INDEPENDENT_AMBULATORY_CARE_PROVIDER_SITE_OTHER): Payer: 59 | Admitting: Internal Medicine

## 2012-05-10 ENCOUNTER — Encounter: Payer: Self-pay | Admitting: Internal Medicine

## 2012-05-10 VITALS — BP 130/90 | HR 88 | Temp 98.6°F | Resp 16 | Wt 257.0 lb

## 2012-05-10 DIAGNOSIS — E538 Deficiency of other specified B group vitamins: Secondary | ICD-10-CM

## 2012-05-10 DIAGNOSIS — I2510 Atherosclerotic heart disease of native coronary artery without angina pectoris: Secondary | ICD-10-CM

## 2012-05-10 DIAGNOSIS — E7800 Pure hypercholesterolemia, unspecified: Secondary | ICD-10-CM

## 2012-05-10 DIAGNOSIS — F17200 Nicotine dependence, unspecified, uncomplicated: Secondary | ICD-10-CM

## 2012-05-10 DIAGNOSIS — M7989 Other specified soft tissue disorders: Secondary | ICD-10-CM

## 2012-05-10 DIAGNOSIS — M79609 Pain in unspecified limb: Secondary | ICD-10-CM

## 2012-05-10 DIAGNOSIS — R05 Cough: Secondary | ICD-10-CM | POA: Insufficient documentation

## 2012-05-10 DIAGNOSIS — R059 Cough, unspecified: Secondary | ICD-10-CM | POA: Insufficient documentation

## 2012-05-10 DIAGNOSIS — I10 Essential (primary) hypertension: Secondary | ICD-10-CM

## 2012-05-10 DIAGNOSIS — M79673 Pain in unspecified foot: Secondary | ICD-10-CM | POA: Insufficient documentation

## 2012-05-10 MED ORDER — DOXYCYCLINE HYCLATE 100 MG PO TABS: 100.0000 mg | ORAL_TABLET | Freq: Two times a day (BID) | ORAL | Status: AC

## 2012-05-10 MED ORDER — METOPROLOL SUCCINATE ER 25 MG PO TB24: 25.0000 mg | ORAL_TABLET | Freq: Every day | ORAL | Status: DC

## 2012-05-10 NOTE — Assessment & Plan Note (Signed)
Continue with current prescription therapy as reflected on the Med list.  

## 2012-05-10 NOTE — Progress Notes (Signed)
Subjective:    Patient ID: [name], male    DOB: [birth year], 61 y.o.   MRN: [id number]  HPI  The patient presents for a follow-up of  chronic hypertension, chronic dyslipidemia, PVD, CAD controlled with medicines. He started Tricor. C/o ED  Wt Readings from Last 3 Encounters:  05/10/12 257 lb (116.574 kg)  02/03/12 265 lb (120.203 kg)  10/07/11 259 lb (117.482 kg)   BP Readings from Last 3 Encounters:  05/10/12 130/90  02/03/12 130/86  10/07/11 124/66     Review of Systems  Constitutional: Negative for appetite change, fatigue and unexpected weight change.  HENT: Negative for nosebleeds, congestion, sore throat, sneezing, trouble swallowing and neck pain.   Eyes: Negative for itching and visual disturbance.  Respiratory: Positive for cough.   Cardiovascular: Negative for palpitations and leg swelling.  Gastrointestinal: Negative for nausea, diarrhea, blood in stool and abdominal distention.  Genitourinary: Negative for frequency and hematuria.  Musculoskeletal: Negative for back pain, joint swelling and gait problem.  Skin: Negative for rash.  Neurological: Negative for dizziness, tremors, speech difficulty and weakness.  Psychiatric/Behavioral: Negative for disturbed wake/sleep cycle, dysphoric mood and agitation. The patient is not nervous/anxious.        Objective:   Physical Exam  Constitutional: He is oriented to person, place, and time. He appears well-developed.       Obese  HENT:  Mouth/Throat: Oropharynx is clear and moist.  Eyes: Conjunctivae are normal. Pupils are equal, round, and reactive to light.  Neck: Normal range of motion. No JVD present. No thyromegaly present.  Cardiovascular: Normal rate, regular rhythm, normal heart sounds and intact distal pulses.  Exam reveals no gallop and no friction rub.   No murmur heard. Pulmonary/Chest: Effort normal and breath sounds normal. No respiratory distress. He has no wheezes. He has no rales. He  exhibits no tenderness.  Abdominal: Soft. Bowel sounds are normal. He exhibits no distension and no mass. There is no tenderness. There is no rebound and no guarding.  Musculoskeletal: Normal range of motion. He exhibits no edema and no tenderness.       R lat foot oval ellastic mass 2x1.5 cm  Lymphadenopathy:    He has no cervical adenopathy.  Neurological: He is alert and oriented to person, place, and time. He has normal reflexes. No cranial nerve deficit. He exhibits normal muscle tone. Coordination normal.  Skin: Skin is warm and dry. No rash noted.  Psychiatric: He has a normal mood and affect. His behavior is normal. Judgment and thought content normal.    Lab Results  Component Value Date   WBC 8.0 01/31/2012   HGB 16.3 01/31/2012   HCT 48.4 01/31/2012   PLT 226.0 01/31/2012   GLUCOSE 99 05/09/2012   CHOL 199 05/09/2012   TRIG 240.0* 05/09/2012   HDL 28.10* 05/09/2012   LDLDIRECT 115.7 05/09/2012   LDLCALC 122* 08/02/2011   ALT 18 05/09/2012   AST 21 05/09/2012   NA 138 05/09/2012   K 3.9 05/09/2012   CL 104 05/09/2012   CREATININE 0.9 05/09/2012   BUN 17 05/09/2012   CO2 25 05/09/2012   TSH 1.65 08/02/2011   PSA 0.48 11/03/2010   INR 1.02 06/06/2011   HGBA1C 5.7 05/09/2012   Procedure Note :    Procedure :   Point of care (POC) sonography examination   Indication: R lat foot growth   Equipment used: Sonosite M-Turbo with HFL38x/13-6 MHz transducer linear probe. The images were stored in the  unit and later transferred in storage.  The patient was placed in a sitting position.  This study revealed a heteroechoic oval 1.23 x 0.77 cm lesion in the R lat mid-foot w/o abnormal vascularization by doppler.   Impression: Soft tissue mass R lat foot - possible fibroma vs other. It could be an atypical ganglion cyst too.         Assessment & Plan:

## 2012-05-10 NOTE — Assessment & Plan Note (Signed)
Trying to quit 

## 2012-05-10 NOTE — Assessment & Plan Note (Addendum)
Postnasal drip 7/13 - poss sinusitis Doxy x 14 d

## 2012-05-10 NOTE — Assessment & Plan Note (Addendum)
7/13 R lat foot growth/pain ? Fibroma vs atypical ganglion Podiatry cons Dr Ralene Cork

## 2012-06-19 ENCOUNTER — Telehealth: Payer: Self-pay | Admitting: *Deleted

## 2012-06-19 NOTE — Telephone Encounter (Signed)
LDL and TRIg ar better. LDL should be lower. Recomm increase Livalo to 2 mg. F/U lipid and AST in 10 wks. ----- Message ----- From: Gerome Apley, RN BSN Sent: 06/15/2012 11:38 AM To: Pricilla Riffle, MD  Left message for patient call concerning Lipid panel and MD recommendation for Livalo.

## 2012-07-05 NOTE — Telephone Encounter (Signed)
Pt rtn call to [employer]

## 2012-07-09 NOTE — Telephone Encounter (Signed)
Called patient to discuss lipid lowering medication and he advised that his insurance company would not cover Livalo and he could not use the discount card. He is currently taking Tricor 145mg  every other day. Advised that I would let Dr.Ross know and call back with any recommendations.

## 2012-07-12 NOTE — Telephone Encounter (Signed)
Has he tried Lipitor.  I know he cannot tolerate crestor, pravachol and zocor  If can't tolerate lipitor can see if we can keep him supplied with samples of livalo

## 2012-07-16 NOTE — Telephone Encounter (Signed)
No samples of LIVALO available per Kennon Rounds. Will discuss with Dr.Ross what else to do and call patient back.

## 2012-08-01 ENCOUNTER — Other Ambulatory Visit (INDEPENDENT_AMBULATORY_CARE_PROVIDER_SITE_OTHER): Payer: 59

## 2012-08-01 ENCOUNTER — Encounter: Payer: Self-pay | Admitting: Pulmonary Disease

## 2012-08-01 ENCOUNTER — Ambulatory Visit (INDEPENDENT_AMBULATORY_CARE_PROVIDER_SITE_OTHER): Payer: 59 | Admitting: Pulmonary Disease

## 2012-08-01 VITALS — BP 140/80 | HR 76 | Temp 98.1°F | Ht 70.0 in | Wt 260.4 lb

## 2012-08-01 DIAGNOSIS — J984 Other disorders of lung: Secondary | ICD-10-CM

## 2012-08-01 DIAGNOSIS — G4733 Obstructive sleep apnea (adult) (pediatric): Secondary | ICD-10-CM

## 2012-08-01 LAB — CBC WITH DIFFERENTIAL/PLATELET
Eosinophils Relative: 1.7 % (ref 0.0–5.0)
HCT: 47.4 % (ref 39.0–52.0)
Hemoglobin: 16.1 g/dL (ref 13.0–17.0)
Lymphocytes Relative: 36.9 % (ref 12.0–46.0)
Lymphs Abs: 2.8 10*3/uL (ref 0.7–4.0)
Monocytes Relative: 9.6 % (ref 3.0–12.0)
Platelets: 216 10*3/uL (ref 150.0–400.0)
WBC: 7.5 10*3/uL (ref 4.5–10.5)

## 2012-08-01 LAB — COMPREHENSIVE METABOLIC PANEL
Chloride: 103 mEq/L (ref 96–112)
Creatinine, Ser: 1 mg/dL (ref 0.4–1.5)
GFR: 83.85 mL/min (ref >60.00)
Potassium: 4.2 mEq/L (ref 3.5–5.1)
Sodium: 138 mEq/L (ref 135–145)
Total Bilirubin: 0.7 mg/dL (ref 0.3–1.2)
Total Protein: 7.2 g/dL (ref 6.0–8.3)

## 2012-08-01 NOTE — Progress Notes (Signed)
Chief Complaint  Patient presents with  . Follow-up    wears cpap everynight 7-8 hrs a night. denies any problems with mask/machine. he sleeps better. does not snore. but still feels tired    History of Present Illness: [name] is a 61 y.o. male smoker with obstructive sleep apnea and Langerhans histiocytosis.  He was visiting family in [place], [place] over the summer.  He had f/u with his urologist, and had CT chest/abd/pelvis.  He was noted to have several pulmonary nodules.  He had Rt VATS biopsy from Rt upper and lower lobes by Dr. Guerry Minors.  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  He was advised to follow up with pulmonary in [place] for further assessment.  He has fatigued, and gets winded with strenuous exertion.  He has occasional dry cough.  He denies fever, sweats, or skin rashes.  He is not having wheeze.  He continues to smoke cigarettes, but is down to 2 to 3 cigarettes per day.  He has been doing well with his CPAP.  He does not have any trouble with his mask.  Tests: PSG 03/02/11>>AHI 28.6, SpO2 low 88%, CPAP 10 cm H2O. 05/23/12 Rt VATS bx>>Langerhans cell histiocytosis RUL and RLL nodules CPAP 11/01/11 to 07/31/12>>Used on 223 of 274 nights with average 5 hrs 27 min.  Average AHI 1.3 with CPAP 10 cm H2O.  Past Medical History  Diagnosis Date  . Hiatal hernia   . Duodenitis   . Renal cyst   . Bell's palsy   . Hypercholesteremia   . Diverticulosis   . Cyst     sebacceous  . Renal cell carcinoma   . Bronchitis   . Low back pain   . Hypertension   . Hyperthyroidism   . Vitamin B12 deficiency   . PVD (peripheral vascular disease)     s/p right iliofem. bypass  . Pancreatitis   . GERD (gastroesophageal reflux disease)   . COPD (chronic obstructive pulmonary disease)   . Colon polyps   . Anxiety   . Coronary artery disease     a. s/p INF STEMI 4/12: DES x 2 (mid and dist RCA);  b. cath 4/12: oLAD 40%, mLAD 40%, pD1 30%, pOM1 40%, pOM3 50%,  mRCA 80% (PCI), PDA 70% (med tx), PLB1 60%  (med tx); EF 55%  . Meralgia paresthetica     right  . Erectile dysfunction   . Chest pain, unspecified   . OSA (obstructive sleep apnea)     PSG 03/02/11>>AHI 28.6, SpO2 low 88%, CPAP 10 cm H2O.  Marland Kitchen Myocardial infarction 4/12    2 stents  . 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 Bypass graft     Rt. iliofemoral   . Inguinal hernia repair     Rt.  . Tumor removal     Cryo Rena  . Colonoscopy   . Upper gastrointestinal endoscopy   . Video assisted thoracoscopy July 2013    Rt upper and Rt lower lobe wedge resections    Outpatient Encounter Prescriptions as of 08/01/2012  Medication Sig Dispense Refill  . aspirin 81 MG tablet Take 81 mg by mouth daily.        . Cholecalciferol (VITAMIN D3) 1000 UNITS CAPS Take 1 capsule by mouth daily.        . clopidogrel (PLAVIX) 75 MG tablet Take 1 tablet (75  mg total) by mouth daily.  90 tablet  2  . Coenzyme Q10 (CO Q-10) 200 MG CAPS Take 200 mg by mouth daily.        . fenofibrate (TRICOR) 145 MG tablet Take 1 tablet (145 mg total) by mouth daily.  90 tablet  3  . hydrocortisone (ANUSOL-HC) 25 MG suppository prn      . losartan (COZAAR) 100 MG tablet Take 1 tablet (100 mg total) by mouth daily.  90 tablet  3  . metoprolol succinate (TOPROL-XL) 25 MG 24 hr tablet Take 1 tablet (25 mg total) by mouth daily. Take at hs  90 tablet  2  . nitroGLYCERIN (NITROSTAT) 0.4 MG SL tablet Place 1 tablet (0.4 mg total) under the tongue every 5 (five) minutes as needed for chest pain. Take 1 tablet for chest pains every 5 minutes up to 3 doses  25 tablet  6  . pantoprazole (PROTONIX) 40 MG tablet Take 1 tablet (40 mg total) by mouth daily.  90 tablet  3  . Pitavastatin Calcium (LIVALO) 1 MG TABS Take 1 tablet (1 mg total) by mouth at bedtime.  30 tablet  3  . traMADol (ULTRAM) 50 MG tablet Take 1 tablet (50 mg total) by mouth 2  (two) times daily. Take 1 to 2 tabs by mouth two times a day as needed  120 tablet  2  . triamcinolone (KENALOG) 0.5 % cream Apply topically 2 (two) times daily as needed.        . vitamin B-12 (CYANOCOBALAMIN) 1000 MCG tablet Take 1,000 mcg by mouth daily.        Marland Kitchen DISCONTD: sildenafil (VIAGRA) 100 MG tablet Take 100 mg by mouth daily as needed.      Marland Kitchen DISCONTD: sucralfate (CARAFATE) 1 GM/10ML suspension Take 10 mLs (1 g total) by mouth 4 (four) times daily.  420 mL  5    Allergies  Allergen Reactions  . Pravastatin Sodium     REACTION: gasy, constipated  . Rosuvastatin     REACTION: myalgias  . Simvastatin     REACTION: myalgia    Physical Exam:  Filed Vitals:   08/01/12 0904  BP: 140/80  Pulse: 76  Temp: 98.1 F (36.7 C)  TempSrc: Oral  Height: 5\' 10"  (1.778 m)  Weight: 260 lb 6.4 oz (118.117 kg)  SpO2: 97%    Current Encounter SPO2  08/01/12 0904 97%  07/21/11 1400 97%  07/06/11 0925 98%     Body mass index is 37.36 kg/(m^2). Wt Readings from Last 2 Encounters:  08/01/12 260 lb 6.4 oz (118.117 kg)  05/10/12 257 lb (116.574 kg)    General - No distress ENT - TM clear, no sinus tenderness, no oral exudate, no LAN, no thyromegaly Cardiac - s1s2 regular, no murmur, pulses symmetric, no edema Chest - normal respiratory excursion, good air entry, no wheeze/rales/dullness Back - no focal tenderness Abd - soft, non-tender, no organomegaly, + bowel sounds Ext - normal motor strength Neuro - Cranial nerves are normal. PERLA. EOM's intact. Skin - no discernible active dermatitis, erythema, urticaria or inflammatory process. Psych - normal mood, and behavior.   Assessment/Plan:  Coralyn Helling, MD [facility]/Sleep Pager:  [phone] 08/01/2012, 3:06 PM

## 2012-08-01 NOTE — Patient Instructions (Signed)
Will schedule CT chest and PFT (breathing test) Lab test today Will request medical records from doctors in [place], [place] Follow up in 4 to 6 weeks

## 2012-08-01 NOTE — Assessment & Plan Note (Signed)
He is compliant with CPAP and reports benefit from therapy. 

## 2012-08-01 NOTE — Assessment & Plan Note (Signed)
He was incidentally found to have pulmonary nodules prompting VATS biopsy over the Summer in [place].  Biopsies showed Langerhans cell histiocytes.  Reviewed the diagnosis with him.  Explained the importance of complete smoking cessation.  Will get medical records from [place].  Will arrange for repeat CT chest with contrast and high resolution cuts, pulmonary function testing, and lab work.  Depending on results will determine if additional interventions are needed.

## 2012-08-03 ENCOUNTER — Telehealth: Payer: Self-pay | Admitting: Pulmonary Disease

## 2012-08-03 NOTE — Telephone Encounter (Signed)
I spoke with patient about results and he verbalized understanding and had no questions 

## 2012-08-03 NOTE — Telephone Encounter (Signed)
CMP     Component Value Date/Time   NA 138 08/01/2012 1004   K 4.2 08/01/2012 1004   CL 103 08/01/2012 1004   CO2 27 08/01/2012 1004   GLUCOSE 101* 08/01/2012 1004   BUN 18 08/01/2012 1004   CREATININE 1.0 08/01/2012 1004   CALCIUM 9.4 08/01/2012 1004   PROT 7.2 08/01/2012 1004   ALBUMIN 3.8 08/01/2012 1004   AST 19 08/01/2012 1004   ALT 22 08/01/2012 1004   ALKPHOS 92 08/01/2012 1004   BILITOT 0.7 08/01/2012 1004    CBC    Component Value Date/Time   WBC 7.5 08/01/2012 1004   RBC 5.20 08/01/2012 1004   HGB 16.1 08/01/2012 1004   HCT 47.4 08/01/2012 1004   PLT 216.0 08/01/2012 1004   MCV 91.2 08/01/2012 1004   MCHC 34.1 08/01/2012 1004   RDW 13.9 08/01/2012 1004   LYMPHSABS 2.8 08/01/2012 1004   MONOABS 0.7 08/01/2012 1004   EOSABS 0.1 08/01/2012 1004   BASOSABS 0.0 08/01/2012 1004    Will have my nurse inform patient that lab results were normal.  Will call back once CT chest and PFT results available.

## 2012-08-03 NOTE — Telephone Encounter (Signed)
Left pt a message to call back. 

## 2012-08-03 NOTE — Telephone Encounter (Signed)
It is not clear to me if he had tried lipitor in past. Please clarify

## 2012-08-13 ENCOUNTER — Telehealth: Payer: Self-pay | Admitting: Internal Medicine

## 2012-08-13 ENCOUNTER — Other Ambulatory Visit: Payer: Self-pay | Admitting: *Deleted

## 2012-08-13 DIAGNOSIS — E785 Hyperlipidemia, unspecified: Secondary | ICD-10-CM

## 2012-08-13 MED ORDER — ATORVASTATIN CALCIUM 20 MG PO TABS: 20.0000 mg | ORAL_TABLET | Freq: Every day | ORAL | Status: DC

## 2012-08-13 NOTE — Telephone Encounter (Signed)
Patient returning nurse call, he can be reached at [phone]

## 2012-08-13 NOTE — Telephone Encounter (Signed)
LMTCB ./CY 

## 2012-08-13 NOTE — Telephone Encounter (Signed)
Dr ross--i finally got ahold of [name] and asked if he had ever taken lipitor--he stated he had tried so many of these meds, he's not sure if lipitor was one of them--so i [facility] in to [facility] [place] and ordered atorvastatin 20mg  1 tab po q hs --# 90--no refills until he has tried them --if this is not correct dose please let me know to change--thanks NT

## 2012-08-14 ENCOUNTER — Ambulatory Visit (INDEPENDENT_AMBULATORY_CARE_PROVIDER_SITE_OTHER)
Admission: RE | Admit: 2012-08-14 | Discharge: 2012-08-14 | Disposition: A | Discharge status: Home / Self Care | Payer: 59 | Source: Ambulatory Visit | Attending: Pulmonary Disease | Admitting: Pulmonary Disease

## 2012-08-14 DIAGNOSIS — J984 Other disorders of lung: Secondary | ICD-10-CM

## 2012-08-14 MED ORDER — IOHEXOL 300 MG/ML  SOLN
80.0000 mL | Freq: Once | INTRAMUSCULAR | Status: AC | PRN
  Administered 2012-08-14: 80 mL via INTRAVENOUS

## 2012-08-15 ENCOUNTER — Telehealth: Payer: Self-pay | Admitting: Pulmonary Disease

## 2012-08-15 NOTE — Telephone Encounter (Signed)
Ct Chest W Contrast  08/14/2012  *RADIOLOGY REPORT*  Clinical Data: Smoker, Langerhans histiocytosis, status post right upper lobectomy  CT CHEST WITH CONTRAST  Technique:  Multidetector CT imaging of the chest was performed following the standard protocol during bolus administration of intravenous contrast.  Contrast: 80mL OMNIPAQUE IOHEXOL 300 MG/ML  SOLN  Comparison: CT chest dated 05/06/2008  Findings: Postsurgical changes/prior wedge resections in the right upper lobe (series 5/image 9) and right lung base (series 5/image 42).  Scattered bilateral pulmonary nodules measuring up to 4-5 mm, unchanged from 2009, benign.  Underlying mild centrilobular emphysematous changes.  Pleural thickening along the lateral aspect of the right lower lobe.  No pleural effusion or pneumothorax.  With inspiratory / expiratory imaging, there is no appreciable air trapping.  Visualized thyroid is unremarkable.  The heart is normal in size.  No pericardial effusion.  Coronary atherosclerosis.  Atherosclerotic calcifications of the aortic arch.  No suspicious mediastinal, hilar, or axillary lymphadenopathy  Visualized upper abdomen is notable for a 2.4 cm right upper pole renal cyst.  Mild degenerative changes of the visualized thoracolumbar spine.  IMPRESSION: Postsurgical changes in the right lung.  Scattered bilateral pulmonary nodules measuring up to 4-5 mm, unchanged from 2009, benign.  Underlying mild centrilobular emphysematous changes.   Original Report Authenticated By: Charline Bills, M.D.     Left message detailing that he has stable, small pulmonary nodules and mild changes of emphysema.  No additional worrisome findings.  No change to current tx plan.  Advised him to call back with questions.  Otherwise, will discuss in more detail at [facility] Oct 31.

## 2012-08-16 ENCOUNTER — Encounter: Payer: Self-pay | Admitting: Internal Medicine

## 2012-08-27 ENCOUNTER — Encounter: Payer: Self-pay | Admitting: Internal Medicine

## 2012-08-27 ENCOUNTER — Ambulatory Visit (INDEPENDENT_AMBULATORY_CARE_PROVIDER_SITE_OTHER): Payer: 59 | Admitting: Internal Medicine

## 2012-08-27 VITALS — BP 148/90 | HR 88 | Temp 99.9°F | Resp 16 | Wt 261.0 lb

## 2012-08-27 DIAGNOSIS — F17200 Nicotine dependence, unspecified, uncomplicated: Secondary | ICD-10-CM

## 2012-08-27 DIAGNOSIS — T148XXA Other injury of unspecified body region, initial encounter: Secondary | ICD-10-CM

## 2012-08-27 DIAGNOSIS — I2510 Atherosclerotic heart disease of native coronary artery without angina pectoris: Secondary | ICD-10-CM

## 2012-08-27 DIAGNOSIS — E7800 Pure hypercholesterolemia, unspecified: Secondary | ICD-10-CM

## 2012-08-27 DIAGNOSIS — C649 Malignant neoplasm of unspecified kidney, except renal pelvis: Secondary | ICD-10-CM

## 2012-08-27 DIAGNOSIS — I739 Peripheral vascular disease, unspecified: Secondary | ICD-10-CM

## 2012-08-27 DIAGNOSIS — E538 Deficiency of other specified B group vitamins: Secondary | ICD-10-CM

## 2012-08-27 DIAGNOSIS — G4733 Obstructive sleep apnea (adult) (pediatric): Secondary | ICD-10-CM

## 2012-08-27 DIAGNOSIS — I10 Essential (primary) hypertension: Secondary | ICD-10-CM

## 2012-08-27 NOTE — Assessment & Plan Note (Signed)
Dr Veronia Beets him to try Lipitor (hold Tricor)

## 2012-08-27 NOTE — Progress Notes (Signed)
Patient ID: [name], male   DOB: December 20, 1950, 61 y.o.   MRN: [id number]   Subjective:    Patient ID: [name], male    DOB: April 07, 1951, 61 y.o.   MRN: [id number]  HPI  The patient presents for a follow-up of  chronic hypertension, chronic dyslipidemia, PVD, CAD controlled with medicines. He started Tricor. He just started Lipitor 3 d ago. C/o ED. C/o more bruising lately  Wt Readings from Last 3 Encounters:  08/27/12 261 lb (118.389 kg)  08/01/12 260 lb 6.4 oz (118.117 kg)  05/10/12 257 lb (116.574 kg)   BP Readings from Last 3 Encounters:  08/27/12 148/90  08/01/12 140/80  05/10/12 130/90     Review of Systems  Constitutional: Negative for appetite change, fatigue and unexpected weight change.  HENT: Negative for nosebleeds, congestion, sore throat, sneezing, trouble swallowing and neck pain.   Eyes: Negative for itching and visual disturbance.  Respiratory: Positive for cough.   Cardiovascular: Negative for palpitations and leg swelling.  Gastrointestinal: Negative for nausea, diarrhea, blood in stool and abdominal distention.  Genitourinary: Negative for frequency and hematuria.  Musculoskeletal: Negative for back pain, joint swelling and gait problem.  Skin: Negative for rash.  Neurological: Negative for dizziness, tremors, speech difficulty and weakness.  Psychiatric/Behavioral: Negative for disturbed wake/sleep cycle, dysphoric mood and agitation. The patient is not nervous/anxious.        Objective:   Physical Exam  Constitutional: He is oriented to person, place, and time. He appears well-developed.       Obese  HENT:  Mouth/Throat: Oropharynx is clear and moist.  Eyes: Conjunctivae normal are normal. Pupils are equal, round, and reactive to light.  Neck: Normal range of motion. No JVD present. No thyromegaly present.  Cardiovascular: Normal rate, regular rhythm, normal heart sounds and intact distal pulses.  Exam reveals no gallop and no friction  rub.   No murmur heard. Pulmonary/Chest: Effort normal and breath sounds normal. No respiratory distress. He has no wheezes. He has no rales. He exhibits no tenderness.  Abdominal: Soft. Bowel sounds are normal. He exhibits no distension and no mass. There is no tenderness. There is no rebound and no guarding.  Musculoskeletal: Normal range of motion. He exhibits no edema and no tenderness.       R lat foot oval ellastic mass 2x1.5 cm  Lymphadenopathy:    He has no cervical adenopathy.  Neurological: He is alert and oriented to person, place, and time. He has normal reflexes. No cranial nerve deficit. He exhibits normal muscle tone. Coordination normal.  Skin: Skin is warm and dry. No rash noted.  Psychiatric: He has a normal mood and affect. His behavior is normal. Judgment and thought content normal.    Lab Results  Component Value Date   WBC 7.5 08/01/2012   HGB 16.1 08/01/2012   HCT 47.4 08/01/2012   PLT 216.0 08/01/2012   GLUCOSE 101* 08/01/2012   CHOL 199 05/09/2012   TRIG 240.0* 05/09/2012   HDL 28.10* 05/09/2012   LDLDIRECT 115.7 05/09/2012   LDLCALC 122* 08/02/2011   ALT 22 08/01/2012   AST 19 08/01/2012   NA 138 08/01/2012   K 4.2 08/01/2012   CL 103 08/01/2012   CREATININE 1.0 08/01/2012   BUN 18 08/01/2012   CO2 27 08/01/2012   TSH 1.65 08/02/2011   PSA 0.48 11/03/2010   INR 1.02 06/06/2011   HGBA1C 5.7 05/09/2012        Assessment & Plan:

## 2012-08-27 NOTE — Assessment & Plan Note (Signed)
Increase Cozaar to 1 tab/d

## 2012-08-27 NOTE — Assessment & Plan Note (Signed)
Continue with current prescription therapy as reflected on the Med list.  

## 2012-08-27 NOTE — Assessment & Plan Note (Signed)
Monitoring

## 2012-08-27 NOTE — Assessment & Plan Note (Signed)
Continue with current CPAP  

## 2012-08-27 NOTE — Assessment & Plan Note (Signed)
Try Vit C Hold ASA if worse

## 2012-08-27 NOTE — Assessment & Plan Note (Signed)
Doing beter

## 2012-08-27 NOTE — Patient Instructions (Signed)
Try Vit C Hold Aspirin if bruising is worse

## 2012-08-30 ENCOUNTER — Ambulatory Visit (INDEPENDENT_AMBULATORY_CARE_PROVIDER_SITE_OTHER): Payer: 59 | Admitting: Pulmonary Disease

## 2012-08-30 ENCOUNTER — Encounter: Payer: Self-pay | Admitting: Pulmonary Disease

## 2012-08-30 VITALS — BP 158/70 | HR 83 | Temp 98.2°F | Ht 70.0 in | Wt 262.0 lb

## 2012-08-30 DIAGNOSIS — J8482 Adult pulmonary Langerhans cell histiocytosis: Secondary | ICD-10-CM

## 2012-08-30 DIAGNOSIS — J438 Other emphysema: Secondary | ICD-10-CM

## 2012-08-30 DIAGNOSIS — J984 Other disorders of lung: Secondary | ICD-10-CM

## 2012-08-30 DIAGNOSIS — J439 Emphysema, unspecified: Secondary | ICD-10-CM

## 2012-08-30 DIAGNOSIS — G4733 Obstructive sleep apnea (adult) (pediatric): Secondary | ICD-10-CM

## 2012-08-30 LAB — PULMONARY FUNCTION TEST

## 2012-08-30 MED ORDER — TIOTROPIUM BROMIDE MONOHYDRATE 18 MCG IN CAPS: 18.0000 ug | ORAL_CAPSULE | Freq: Every day | RESPIRATORY_TRACT | Status: DC

## 2012-08-30 NOTE — Patient Instructions (Signed)
Try spiriva one puff daily for one week>>call in one week to update status on how much spiriva is helping Follow up in 3 months

## 2012-08-30 NOTE — Progress Notes (Signed)
PFT done today. 

## 2012-08-30 NOTE — Assessment & Plan Note (Signed)
He has borderline COPD with emphysema.  He has dyspnea on exertion.  Will give trial of spiriva for one week.  Advised him to call in one week, and then decide if this needs to be continued.

## 2012-08-30 NOTE — Progress Notes (Signed)
Chief Complaint  Patient presents with  . Follow-up    w/ PFT. SOB has unchanged. no cough, wheezing, chest tx    History of Present Illness: [name] is a 61 y.o. male smoker with obstructive sleep apnea and Langerhans histiocytosis.  He is here to review PFT and CT chest.  He gets winded going up stairs.  He does not have much cough.  He is not having wheeze or sputum.  He continues to smoke.  He is planning to quit next week.  He will use E cigarette.  He has been doing well with his CPAP.  He does not have any trouble with his mask.  Tests: PSG 03/02/11>>AHI 28.6, SpO2 low 88%, CPAP 10 cm H2O. 05/23/12 Rt VATS bx>>Langerhans cell histiocytosis RUL and RLL nodules CPAP 11/01/11 to 07/31/12>>Used on 223 of 274 nights with average 5 hrs 27 min.  Average AHI 1.3 with CPAP 10 cm H2O. CT chest 08/14/12>>scattered b/l pulmonary nodules no change since 2009, mild centrilobular emphysema PFT 08/30/12>>FEV1 2.64 (81%) , FEV1% 69, TLC 5.52 (83%), DLCO 67%, no BD  Past Medical History  Diagnosis Date  . Hiatal hernia   . Duodenitis   . Renal cyst   . Bell's palsy   . Hypercholesteremia   . Diverticulosis   . Cyst     sebacceous  . Renal cell carcinoma   . Bronchitis   . Low back pain   . Hypertension   . Hyperthyroidism   . Vitamin B12 deficiency   . PVD (peripheral vascular disease)     s/p right iliofem. bypass  . Pancreatitis   . GERD (gastroesophageal reflux disease)   . COPD (chronic obstructive pulmonary disease)   . Colon polyps   . Anxiety   . Coronary artery disease     a. s/p INF STEMI 4/12: DES x 2 (mid and dist RCA);  b. cath 4/12: oLAD 40%, mLAD 40%, pD1 30%, pOM1 40%, pOM3 50%, mRCA 80% (PCI), PDA 70% (med tx), PLB1 60%  (med tx); EF 55%  . Meralgia paresthetica     right  . Erectile dysfunction   . Chest pain, unspecified   . OSA (obstructive sleep apnea)     PSG 03/02/11>>AHI 28.6, SpO2 low 88%, CPAP 10 cm H2O.  Marland Kitchen Myocardial infarction 4/12   2 stents  . 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 Bypass graft     Rt. iliofemoral   . Inguinal hernia repair     Rt.  . Tumor removal     Cryo Rena  . Colonoscopy   . Upper gastrointestinal endoscopy   . Video assisted thoracoscopy July 2013    Rt upper and Rt lower lobe wedge resections    Outpatient Encounter Prescriptions as of 08/30/2012  Medication Sig Dispense Refill  . aspirin 81 MG tablet Take 81 mg by mouth daily.        Marland Kitchen atorvastatin (LIPITOR) 20 MG tablet Take 1 tablet (20 mg total) by mouth daily after supper.  90 tablet  3  . Cholecalciferol (VITAMIN D3) 1000 UNITS CAPS Take 1 capsule by mouth daily.        . clopidogrel (PLAVIX) 75 MG tablet Take 1 tablet (75 mg total) by mouth daily.  90 tablet  2  . Coenzyme Q10 (CO Q-10) 200 MG CAPS Take 200 mg by mouth daily.        Marland Kitchen  hydrocortisone (ANUSOL-HC) 25 MG suppository prn      . losartan (COZAAR) 100 MG tablet Take 1 tablet (100 mg total) by mouth daily.  90 tablet  3  . metoprolol succinate (TOPROL-XL) 25 MG 24 hr tablet Take 1 tablet (25 mg total) by mouth daily. Take at hs  90 tablet  2  . nitroGLYCERIN (NITROSTAT) 0.4 MG SL tablet Place 1 tablet (0.4 mg total) under the tongue every 5 (five) minutes as needed for chest pain. Take 1 tablet for chest pains every 5 minutes up to 3 doses  25 tablet  6  . pantoprazole (PROTONIX) 40 MG tablet Take 1 tablet (40 mg total) by mouth daily.  90 tablet  3  . traMADol (ULTRAM) 50 MG tablet Take 1 tablet (50 mg total) by mouth 2 (two) times daily. Take 1 to 2 tabs by mouth two times a day as needed  120 tablet  2  . triamcinolone (KENALOG) 0.5 % cream Apply topically 2 (two) times daily as needed.        . vitamin B-12 (CYANOCOBALAMIN) 1000 MCG tablet Take 1,000 mcg by mouth daily.        . vitamin C (ASCORBIC ACID) 500 MG tablet Take 500 mg by mouth daily.      Marland Kitchen DISCONTD: Pitavastatin Calcium  (LIVALO) 1 MG TABS Take 1 tablet (1 mg total) by mouth at bedtime.  30 tablet  3    Allergies  Allergen Reactions  . Pravastatin Sodium     REACTION: gasy, constipated  . Rosuvastatin     REACTION: myalgias  . Simvastatin     REACTION: myalgia    Physical Exam:  Filed Vitals:   08/30/12 0943 08/30/12 0946  BP:  158/70  Pulse:  83  Temp: 98.2 F (36.8 C)   TempSrc: Oral   Height: 5\' 10"  (1.778 m)   Weight: 262 lb (118.842 kg)   SpO2:  99%    Current Encounter SPO2  08/30/12 0946 99%  08/01/12 0904 97%  07/21/11 1400 97%    Body mass index is 37.59 kg/(m^2).   Wt Readings from Last 2 Encounters:  08/30/12 262 lb (118.842 kg)  08/27/12 261 lb (118.389 kg)    General - No distress ENT - No sinus tenderness, no oral exudate, no LAN Cardiac - s1s2 regular, no murmur Chest - normal respiratory excursion, good air entry, no wheeze/rales/dullness Back - no focal tenderness Abd - soft, non-tender Ext - no edema Neuro - normal strength Skin - no rashes Psych - normal mood, and behavior.   Assessment/Plan:  Coralyn Helling, MD [facility]/Sleep Pager:  [phone] 08/30/2012, 9:52 AM

## 2012-08-30 NOTE — Assessment & Plan Note (Signed)
He is compliant and reports benefit from CPAP.

## 2012-08-30 NOTE — Addendum Note (Signed)
Addended by: Tommie Sams on: 08/30/2012 10:15 AM   Modules accepted: Orders

## 2012-08-30 NOTE — Assessment & Plan Note (Signed)
CT chest and PFT are re-assuring.  Again emphasized that he needs to stop smoking.

## 2012-09-13 ENCOUNTER — Telehealth: Payer: Self-pay | Admitting: Pulmonary Disease

## 2012-09-13 MED ORDER — TIOTROPIUM BROMIDE MONOHYDRATE 18 MCG IN CAPS: 18.0000 ug | ORAL_CAPSULE | Freq: Every day | RESPIRATORY_TRACT | Status: DC

## 2012-09-13 NOTE — Telephone Encounter (Signed)
I spoke with pt and he stated the spiriva has improved his breathing. He is not breathing has heavy at night now. [facility] [place] [facility]. I have done so. Will forward to Dr. Craige Cotta as [place]ain Childes

## 2012-09-14 NOTE — Telephone Encounter (Signed)
Noted  

## 2012-11-29 ENCOUNTER — Other Ambulatory Visit (INDEPENDENT_AMBULATORY_CARE_PROVIDER_SITE_OTHER): Payer: 59

## 2012-11-29 DIAGNOSIS — E7800 Pure hypercholesterolemia, unspecified: Secondary | ICD-10-CM

## 2012-11-29 DIAGNOSIS — I10 Essential (primary) hypertension: Secondary | ICD-10-CM

## 2012-11-29 DIAGNOSIS — T148XXA Other injury of unspecified body region, initial encounter: Secondary | ICD-10-CM

## 2012-11-29 DIAGNOSIS — I739 Peripheral vascular disease, unspecified: Secondary | ICD-10-CM

## 2012-11-29 DIAGNOSIS — I2510 Atherosclerotic heart disease of native coronary artery without angina pectoris: Secondary | ICD-10-CM

## 2012-11-29 DIAGNOSIS — E538 Deficiency of other specified B group vitamins: Secondary | ICD-10-CM

## 2012-11-29 DIAGNOSIS — G4733 Obstructive sleep apnea (adult) (pediatric): Secondary | ICD-10-CM

## 2012-11-29 DIAGNOSIS — F17200 Nicotine dependence, unspecified, uncomplicated: Secondary | ICD-10-CM

## 2012-11-29 LAB — LIPID PANEL
HDL: 21.9 mg/dL — ABNORMAL LOW (ref >39.00)
Triglycerides: 261 mg/dL — ABNORMAL HIGH (ref 0.0–149.0)
VLDL: 52.2 mg/dL — ABNORMAL HIGH (ref 0.0–40.0)

## 2012-11-29 LAB — HEPATIC FUNCTION PANEL
ALT: 25 U/L (ref 0–53)
AST: 21 U/L (ref 0–37)
Albumin: 3.9 g/dL (ref 3.5–5.2)
Alkaline Phosphatase: 86 U/L (ref 39–117)
Bilirubin, Direct: 0.1 mg/dL (ref 0.0–0.3)
Total Bilirubin: 0.9 mg/dL (ref 0.3–1.2)
Total Protein: 7.1 g/dL (ref 6.0–8.3)

## 2012-11-29 LAB — VITAMIN B12: Vitamin B-12: 568 pg/mL (ref 211–911)

## 2012-11-29 LAB — CK: Total CK: 180 U/L (ref 7–232)

## 2012-11-29 LAB — LDL CHOLESTEROL, DIRECT: Direct LDL: 113 mg/dL

## 2012-11-29 LAB — TSH: TSH: 1.89 u[IU]/mL (ref 0.35–5.50)

## 2012-12-05 ENCOUNTER — Ambulatory Visit (INDEPENDENT_AMBULATORY_CARE_PROVIDER_SITE_OTHER): Payer: 59 | Admitting: Internal Medicine

## 2012-12-05 ENCOUNTER — Encounter: Payer: Self-pay | Admitting: Internal Medicine

## 2012-12-05 VITALS — BP 160/90 | HR 72 | Temp 99.0°F | Resp 16 | Wt 266.0 lb

## 2012-12-05 DIAGNOSIS — J439 Emphysema, unspecified: Secondary | ICD-10-CM

## 2012-12-05 DIAGNOSIS — T148XXA Other injury of unspecified body region, initial encounter: Secondary | ICD-10-CM

## 2012-12-05 DIAGNOSIS — I2510 Atherosclerotic heart disease of native coronary artery without angina pectoris: Secondary | ICD-10-CM

## 2012-12-05 DIAGNOSIS — R5381 Other malaise: Secondary | ICD-10-CM

## 2012-12-05 DIAGNOSIS — M545 Low back pain, unspecified: Secondary | ICD-10-CM

## 2012-12-05 DIAGNOSIS — I739 Peripheral vascular disease, unspecified: Secondary | ICD-10-CM

## 2012-12-05 DIAGNOSIS — J438 Other emphysema: Secondary | ICD-10-CM

## 2012-12-05 DIAGNOSIS — G4733 Obstructive sleep apnea (adult) (pediatric): Secondary | ICD-10-CM

## 2012-12-05 DIAGNOSIS — E538 Deficiency of other specified B group vitamins: Secondary | ICD-10-CM

## 2012-12-05 DIAGNOSIS — E7800 Pure hypercholesterolemia, unspecified: Secondary | ICD-10-CM

## 2012-12-05 DIAGNOSIS — E291 Testicular hypofunction: Secondary | ICD-10-CM

## 2012-12-05 NOTE — Addendum Note (Signed)
Addended by: Merrilyn Puma on: 12/05/2012 08:36 AM   Modules accepted: Orders

## 2012-12-05 NOTE — Assessment & Plan Note (Signed)
Discussed.

## 2012-12-05 NOTE — Assessment & Plan Note (Signed)
Take Metoprolol at hs

## 2012-12-05 NOTE — Assessment & Plan Note (Signed)
Continue with current prescription therapy as reflected on the Med list.  

## 2012-12-05 NOTE — Assessment & Plan Note (Signed)
No sx's lately 

## 2012-12-05 NOTE — Assessment & Plan Note (Signed)
Resolved

## 2012-12-05 NOTE — Assessment & Plan Note (Signed)
He is trying to quit smoking -- e-cigs Continue with current prescription therapy as reflected on the Med list.

## 2012-12-05 NOTE — Progress Notes (Signed)
   Subjective:     HPI  The patient presents for a follow-up of  chronic hypertension, chronic dyslipidemia, PVD, CAD controlled with medicines. He started Tricor. He doesn't take  Lipitor. C/o ED. F/u bruising - resolved. C/o B elbow pain. C/o fatigue from Metoprolol.  Wt Readings from Last 3 Encounters:  12/05/12 266 lb (120.657 kg)  08/30/12 262 lb (118.842 kg)  08/27/12 261 lb (118.389 kg)   BP Readings from Last 3 Encounters:  12/05/12 160/90  08/30/12 158/70  08/27/12 148/90     Review of Systems  Constitutional: Negative for appetite change, fatigue and unexpected weight change.  HENT: Negative for nosebleeds, congestion, sore throat, sneezing, trouble swallowing and neck pain.   Eyes: Negative for itching and visual disturbance.  Respiratory: Positive for cough.   Cardiovascular: Negative for palpitations and leg swelling.  Gastrointestinal: Negative for nausea, diarrhea, blood in stool and abdominal distention.  Genitourinary: Negative for frequency and hematuria.  Musculoskeletal: Negative for back pain, joint swelling and gait problem.  Skin: Negative for rash.  Neurological: Negative for dizziness, tremors, speech difficulty and weakness.  Psychiatric/Behavioral: Negative for sleep disturbance, dysphoric mood and agitation. The patient is not nervous/anxious.        Objective:   Physical Exam  Constitutional: He is oriented to person, place, and time. He appears well-developed.       Obese  HENT:  Mouth/Throat: Oropharynx is clear and moist.  Eyes: Conjunctivae normal are normal. Pupils are equal, round, and reactive to light.  Neck: Normal range of motion. No JVD present. No thyromegaly present.  Cardiovascular: Normal rate, regular rhythm, normal heart sounds and intact distal pulses.  Exam reveals no gallop and no friction rub.   No murmur heard. Pulmonary/Chest: Effort normal and breath sounds normal. No respiratory distress. He has no wheezes. He has no  rales. He exhibits no tenderness.  Abdominal: Soft. Bowel sounds are normal. He exhibits no distension and no mass. There is no tenderness. There is no rebound and no guarding.  Musculoskeletal: Normal range of motion. He exhibits no edema and no tenderness.       R lat foot oval ellastic mass 2x1.5 cm  Lymphadenopathy:    He has no cervical adenopathy.  Neurological: He is alert and oriented to person, place, and time. He has normal reflexes. No cranial nerve deficit. He exhibits normal muscle tone. Coordination normal.  Skin: Skin is warm and dry. No rash noted.  Psychiatric: He has a normal mood and affect. His behavior is normal. Judgment and thought content normal.  elbows mild discomfort -B lat epic  Lab Results  Component Value Date   WBC 7.5 08/01/2012   HGB 16.1 08/01/2012   HCT 47.4 08/01/2012   PLT 216.0 08/01/2012   GLUCOSE 101* 08/01/2012   CHOL 188 11/29/2012   TRIG 261.0* 11/29/2012   HDL 21.90* 11/29/2012   LDLDIRECT 113.0 11/29/2012   LDLCALC 122* 08/02/2011   ALT 25 11/29/2012   AST 21 11/29/2012   NA 138 08/01/2012   K 4.2 08/01/2012   CL 103 08/01/2012   CREATININE 1.0 08/01/2012   BUN 18 08/01/2012   CO2 27 08/01/2012   TSH 1.89 11/29/2012   PSA 0.48 11/03/2010   INR 1.02 06/06/2011   HGBA1C 5.7 05/09/2012        Assessment & Plan:

## 2012-12-05 NOTE — Assessment & Plan Note (Signed)
  On diet  

## 2012-12-05 NOTE — Assessment & Plan Note (Signed)
CPAP.  

## 2012-12-11 ENCOUNTER — Other Ambulatory Visit: Payer: Self-pay | Admitting: Internal Medicine

## 2012-12-18 ENCOUNTER — Other Ambulatory Visit: Payer: Self-pay

## 2012-12-18 MED ORDER — CLOPIDOGREL BISULFATE 75 MG PO TABS: 75.0000 mg | ORAL_TABLET | Freq: Every day | ORAL | Status: DC

## 2013-01-25 ENCOUNTER — Encounter: Payer: Self-pay | Admitting: Pulmonary Disease

## 2013-01-25 ENCOUNTER — Ambulatory Visit (INDEPENDENT_AMBULATORY_CARE_PROVIDER_SITE_OTHER): Payer: 59 | Admitting: Pulmonary Disease

## 2013-01-25 VITALS — BP 138/68 | HR 87 | Temp 97.6°F | Ht 70.0 in | Wt 272.0 lb

## 2013-01-25 DIAGNOSIS — G4733 Obstructive sleep apnea (adult) (pediatric): Secondary | ICD-10-CM

## 2013-01-25 DIAGNOSIS — J8482 Adult pulmonary Langerhans cell histiocytosis: Secondary | ICD-10-CM

## 2013-01-25 DIAGNOSIS — J438 Other emphysema: Secondary | ICD-10-CM

## 2013-01-25 DIAGNOSIS — J439 Emphysema, unspecified: Secondary | ICD-10-CM

## 2013-01-25 NOTE — Patient Instructions (Signed)
Will get copy of CPAP download and call with results Follow up in 6 months with chest xray

## 2013-01-25 NOTE — Assessment & Plan Note (Signed)
Will repeat chest xray at next visit.  Again emphasized the importance of smoking cessation.

## 2013-01-25 NOTE — Progress Notes (Signed)
Chief Complaint  Patient presents with  . COPD    Breathing is unchanged.  . Sleep Apnea    Currently uses CPAP every night for 6-7 hours. Denies any problems with the machine, mask or pressure.    History of Present Illness: [name] is a 62 y.o. male smoker with obstructive sleep apnea and Langerhans histiocytosis.  His breathing has been doing better since he started spiriva.  He still gets winded going up stairs, but quickly recovers.    He still smokes 10 cigarettes per day.  He does this in the morning while drinking coffee, and after work.  He has joined a gym and wants to lose several pounds first before he tries to stop smoking again.  He has been doing well with his CPAP.  He does not have any trouble with his mask.  He uses this every night.  He still has feelings of fatigue in the afternoon, and naps frequently after work.  His wife uses nuvigil and wanted to know if this is an option for him  Tests: PSG 03/02/11>>AHI 28.6, SpO2 low 88%, CPAP 10 cm H2O. 05/23/12 Rt VATS bx>>Langerhans cell histiocytosis RUL and RLL nodules CPAP 11/01/11 to 07/31/12>>Used on 223 of 274 nights with average 5 hrs 27 min.  Average AHI 1.3 with CPAP 10 cm H2O. CT chest 08/14/12>>scattered b/l pulmonary nodules no change since 2009, mild centrilobular emphysema PFT 08/30/12>>FEV1 2.64 (81%) , FEV1% 69, TLC 5.52 (83%), DLCO 67%, no BD  [name]  has a past medical history of Hiatal hernia; Duodenitis; Renal cyst; Bell's palsy; Hypercholesteremia; Diverticulosis; Cyst; Renal cell carcinoma; Bronchitis; Low back pain; Hypertension; Hyperthyroidism; Vitamin B12 deficiency; PVD (peripheral vascular disease); Pancreatitis; GERD (gastroesophageal reflux disease); COPD (chronic obstructive pulmonary disease); Colon polyps; Anxiety; Coronary artery disease; Meralgia paresthetica; Erectile dysfunction; Chest pain, unspecified; OSA (obstructive sleep apnea); Myocardial infarction (4/12); and  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  [name]  has past surgical history that includes Bypass Graft; Inguinal hernia repair; Tumor removal; Colonoscopy; Upper gastrointestinal endoscopy; and Video assisted thoracoscopy (July 2013).   Outpatient Encounter Prescriptions as of 01/25/2013  Medication Sig Dispense Refill  . aspirin 81 MG tablet Take 81 mg by mouth daily.        . Cholecalciferol (VITAMIN D3) 1000 UNITS CAPS Take 1 capsule by mouth daily.        . clopidogrel (PLAVIX) 75 MG tablet Take 1 tablet (75 mg total) by mouth daily.  30 tablet  2  . Coenzyme Q10 (CO Q-10) 200 MG CAPS Take 200 mg by mouth daily.        . hydrocortisone (ANUSOL-HC) 25 MG suppository prn      . losartan (COZAAR) 100 MG tablet Take 1 tablet (100 mg total) by mouth daily.  90 tablet  3  . metoprolol succinate (TOPROL-XL) 25 MG 24 hr tablet Take 1 tablet (25 mg total) by mouth daily. Take at hs  90 tablet  2  . nitroGLYCERIN (NITROSTAT) 0.4 MG SL tablet Place 1 tablet (0.4 mg total) under the tongue every 5 (five) minutes as needed for chest pain. Take 1 tablet for chest pains every 5 minutes up to 3 doses  25 tablet  6  . tiotropium (SPIRIVA) 18 MCG inhalation capsule Place 1 capsule (18 mcg total) into inhaler and inhale daily.  90 capsule  1  . traMADol (ULTRAM) 50 MG tablet Take 1 tablet (50 mg total) by mouth 2 (two) times daily. Take 1  to 2 tabs by mouth two times a day as needed  120 tablet  2  . triamcinolone (KENALOG) 0.5 % cream Apply topically 2 (two) times daily as needed.        . vitamin B-12 (CYANOCOBALAMIN) 1000 MCG tablet Take 1,000 mcg by mouth daily.        . vitamin C (ASCORBIC ACID) 500 MG tablet Take 500 mg by mouth daily.      . pantoprazole (PROTONIX) 40 MG tablet Take 1 tablet (40 mg total) by mouth daily.  90 tablet  3   No facility-administered encounter medications on file as of 01/25/2013.    Allergies  Allergen Reactions  . Pravastatin Sodium     REACTION: gasy,  constipated  . Rosuvastatin     REACTION: myalgias  . Simvastatin     REACTION: myalgia    Physical Exam:  Filed Vitals:   01/25/13 1629  BP: 138/68  Pulse: 87  Temp: 97.6 F (36.4 C)  TempSrc: Oral  Height: 5\' 10"  (1.778 m)  Weight: 272 lb (123.378 kg)  SpO2: 97%    Current Encounter SPO2  01/25/13 1629 97%  08/30/12 0946 99%  08/01/12 0904 97%    Body mass index is 39.03 kg/(m^2).   Wt Readings from Last 2 Encounters:  01/25/13 272 lb (123.378 kg)  12/05/12 266 lb (120.657 kg)    General - No distress ENT - No sinus tenderness, no oral exudate, no LAN Cardiac - s1s2 regular, no murmur Chest - normal respiratory excursion, good air entry, no wheeze/rales/dullness Back - no focal tenderness Abd - soft, non-tender Ext - no edema Neuro - normal strength Skin - no rashes Psych - normal mood, and behavior.   Assessment/Plan:  Coralyn Helling, MD [facility] Pager:  [phone] 01/25/2013, 4:34 PM

## 2013-01-25 NOTE — Assessment & Plan Note (Signed)
Improved with spiriva.  Will continue.  Advised him to call if he feels he needs a rescue inhaler.

## 2013-01-25 NOTE — Assessment & Plan Note (Signed)
He reports increased daytime fatigue.  Will get his CPAP download to determine if he needs adjustment to his CPAP set up.    Explained that nuvigil could be an option to assist with daytime alertness.  Would need to ensure his CPAP set up is optimal first, and then would need to coordinate with cardiology about whether he could use stimulant medication like nuvigil.

## 2013-02-08 ENCOUNTER — Ambulatory Visit (INDEPENDENT_AMBULATORY_CARE_PROVIDER_SITE_OTHER): Payer: 59 | Admitting: Internal Medicine

## 2013-02-08 ENCOUNTER — Encounter: Payer: Self-pay | Admitting: Internal Medicine

## 2013-02-08 VITALS — BP 156/80 | HR 69 | Ht 70.0 in | Wt 265.0 lb

## 2013-02-08 DIAGNOSIS — I10 Essential (primary) hypertension: Secondary | ICD-10-CM

## 2013-02-08 MED ORDER — AMLODIPINE BESYLATE 2.5 MG PO TABS: 2.5000 mg | ORAL_TABLET | Freq: Every day | ORAL | Status: DC

## 2013-02-08 MED ORDER — EZETIMIBE 10 MG PO TABS: 10.0000 mg | ORAL_TABLET | Freq: Every day | ORAL | Status: DC

## 2013-02-08 NOTE — Patient Instructions (Addendum)
Taper off of toprol. Then start Amlodipine 2.5mg  daily.  Start Zetia 10mg  daily.  FASTING LABS IN 8 WEEKS:  LIPIDS, AST, CBC

## 2013-02-08 NOTE — Progress Notes (Addendum)
HPI [name] is a 62 y.o. male with a history of CAD. He is s/p IWMI in April 2012. Cath showed 80% then 100% RCA. Both lesions were treated with Promuse stens. He had moderated residual disease of the PDA and PLSA. LVEF was 55%  He also has a history of COPD, PAD, hypertension, hyperlipidemia and sleep apnea (wears CPAP) He was last in clinic in December 2012  Since seen he had lung surgery.  Removal of a benign tumor.  HE denies CP  Breathing is stable  He continues to smoke about 1/2 ppd. Wants to lose wt before trying to quit. Just joined gym Allergies  Allergen Reactions  . Pravastatin Sodium     REACTION: gasy, constipated  . Rosuvastatin     REACTION: myalgias  . Simvastatin     REACTION: myalgia    Current Outpatient Prescriptions  Medication Sig Dispense Refill  . aspirin 81 MG tablet Take 81 mg by mouth daily.        . Cholecalciferol (VITAMIN D3) 1000 UNITS CAPS Take 1 capsule by mouth daily.        . clopidogrel (PLAVIX) 75 MG tablet Take 1 tablet (75 mg total) by mouth daily.  30 tablet  2  . Coenzyme Q10 (CO Q-10) 200 MG CAPS Take 200 mg by mouth daily.        . hydrocortisone (ANUSOL-HC) 25 MG suppository prn      . metoprolol succinate (TOPROL-XL) 25 MG 24 hr tablet Take 1 tablet (25 mg total) by mouth daily. Take at hs  90 tablet  2  . nitroGLYCERIN (NITROSTAT) 0.4 MG SL tablet Place 1 tablet (0.4 mg total) under the tongue every 5 (five) minutes as needed for chest pain. Take 1 tablet for chest pains every 5 minutes up to 3 doses  25 tablet  6  . tiotropium (SPIRIVA) 18 MCG inhalation capsule Place 1 capsule (18 mcg total) into inhaler and inhale daily.  90 capsule  1  . traMADol (ULTRAM) 50 MG tablet Take 1 tablet (50 mg total) by mouth 2 (two) times daily. Take 1 to 2 tabs by mouth two times a day as needed  120 tablet  2  . triamcinolone (KENALOG) 0.5 % cream Apply topically 2 (two) times daily as needed.        . vitamin B-12 (CYANOCOBALAMIN) 1000 MCG  tablet Take 1,000 mcg by mouth daily.        . vitamin C (ASCORBIC ACID) 500 MG tablet Take 500 mg by mouth daily.      Marland Kitchen losartan (COZAAR) 100 MG tablet Take 1 tablet (100 mg total) by mouth daily.  90 tablet  3  . pantoprazole (PROTONIX) 40 MG tablet Take 1 tablet (40 mg total) by mouth daily.  90 tablet  3   No current facility-administered medications for this visit.    Past Medical History  Diagnosis Date  . Hiatal hernia   . Duodenitis   . Renal cyst   . Bell's palsy   . Hypercholesteremia   . Diverticulosis   . Cyst     sebacceous  . Renal cell carcinoma   . Bronchitis   . Low back pain   . Hypertension   . Hyperthyroidism   . Vitamin B12 deficiency   . PVD (peripheral vascular disease)     s/p right iliofem. bypass  . Pancreatitis   . GERD (gastroesophageal reflux disease)   . COPD (chronic obstructive pulmonary disease)   .  Colon polyps   . Anxiety   . Coronary artery disease     a. s/p INF STEMI 4/12: DES x 2 (mid and dist RCA);  b. cath 4/12: oLAD 40%, mLAD 40%, pD1 30%, pOM1 40%, pOM3 50%, mRCA 80% (PCI), PDA 70% (med tx), PLB1 60%  (med tx); EF 55%  . Meralgia paresthetica     right  . Erectile dysfunction   . Chest pain, unspecified   . OSA (obstructive sleep apnea)     PSG 03/02/11>>AHI 28.6, SpO2 low 88%, CPAP 10 cm H2O.  Marland Kitchen Myocardial infarction 4/12    2 stents  . 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 Bypass graft      Rt. iliofemoral   . Inguinal hernia repair      Rt.  . Tumor removal      Cryo Rena  . Colonoscopy    . Upper gastrointestinal endoscopy    . Video assisted thoracoscopy  July 2013    Rt upper and Rt lower lobe wedge resections    Family History  Problem Relation Age of Onset  . Colon cancer Paternal Grandmother   . Cancer Paternal Grandmother     colon  . Hypertension    . Stroke Father   . Hypertension Mother     History    Social History  . Marital Status: Divorced    Spouse Name: N/A    Number of Children: N/A  . Years of Education: N/A   Occupational History  . business Production designer, theatre/television/film     [employer]   Social History Main Topics  . Smoking status: Passive Smoke Exposure - Never Smoker -- 1.00 packs/day for 40 years    Types: Cigarettes    Last Attempt to Quit: 02/15/2011  . Smokeless tobacco: Never Used     Comment: quit smoking in 2010 restarted 2011 1 ppd  . Alcohol Use: Yes     Comment: occasionally  . Drug Use: No  . Sexually Active: Yes   Other Topics Concern  . Not on file   Social History Narrative  . No narrative on file    Review of Systems:  All systems reviewed.  They are negative to the above problem except as previously stated.  Vital Signs: BP 156/80  Pulse 69  Ht 5\' 10"  (1.778 m)  Wt 265 lb (120.203 kg)  BMI 38.02 kg/m2  Physical Exam Patient is in NAD HEENT:  Normocephalic, atraumatic. EOMI, PERRLA.  Neck: JVP is normal.  No bruits.  Lungs: clear to auscultation. No rales no wheezes.  Heart: Regular rate and rhythm. Normal S1, S2. No S3.   No significant murmurs. PMI not displaced.  Abdomen:  Supple, nontender. Normal bowel sounds. No masses. No hepatomegaly.  Extremities:   Good distal pulses throughout. No lower extremity edema.  Musculoskeletal :moving all extremities.  Neuro:   alert and oriented x3.  CN II-XII grossly intact.  SR 69 bpm. Posterior MI Assessment and Plan:  1.  CAD  No symptoms of angina.  I would keep on same regimen  2.  HL  WIll try Zetia.  Did not tolerate statins though not sure if tried Livalo WIll check lipids in 8 wks.  3.  HTN  BP is high  I would add malidipine 2.5 mg.  D/c toprol  He has appt with Plotnikov in 1 months.  4.  Tob  Counselled on cessation.   Otherwise f/u in 12 months.

## 2013-03-08 ENCOUNTER — Other Ambulatory Visit: Payer: Self-pay | Admitting: Internal Medicine

## 2013-03-26 ENCOUNTER — Other Ambulatory Visit: Payer: Self-pay | Admitting: Internal Medicine

## 2013-04-01 ENCOUNTER — Other Ambulatory Visit (INDEPENDENT_AMBULATORY_CARE_PROVIDER_SITE_OTHER): Payer: 59

## 2013-04-01 DIAGNOSIS — R5383 Other fatigue: Secondary | ICD-10-CM

## 2013-04-01 DIAGNOSIS — E291 Testicular hypofunction: Secondary | ICD-10-CM

## 2013-04-01 DIAGNOSIS — E538 Deficiency of other specified B group vitamins: Secondary | ICD-10-CM

## 2013-04-01 DIAGNOSIS — T148XXA Other injury of unspecified body region, initial encounter: Secondary | ICD-10-CM

## 2013-04-01 DIAGNOSIS — J438 Other emphysema: Secondary | ICD-10-CM

## 2013-04-01 DIAGNOSIS — M545 Low back pain, unspecified: Secondary | ICD-10-CM

## 2013-04-01 DIAGNOSIS — J439 Emphysema, unspecified: Secondary | ICD-10-CM

## 2013-04-01 DIAGNOSIS — I2510 Atherosclerotic heart disease of native coronary artery without angina pectoris: Secondary | ICD-10-CM

## 2013-04-01 DIAGNOSIS — R5381 Other malaise: Secondary | ICD-10-CM

## 2013-04-01 DIAGNOSIS — E7800 Pure hypercholesterolemia, unspecified: Secondary | ICD-10-CM

## 2013-04-01 LAB — LIPID PANEL
Cholesterol: 138 mg/dL (ref 0–200)
HDL: 22 mg/dL — ABNORMAL LOW (ref >39.00)
LDL Cholesterol: 76 mg/dL (ref 0–99)
Triglycerides: 198 mg/dL — ABNORMAL HIGH (ref 0.0–149.0)
VLDL: 39.6 mg/dL (ref 0.0–40.0)

## 2013-04-01 LAB — BASIC METABOLIC PANEL
CO2: 28 mEq/L (ref 19–32)
Chloride: 104 mEq/L (ref 96–112)
Glucose, Bld: 77 mg/dL (ref 70–99)
Potassium: 4.4 mEq/L (ref 3.5–5.1)
Sodium: 137 mEq/L (ref 135–145)

## 2013-04-02 ENCOUNTER — Other Ambulatory Visit: Payer: Self-pay | Admitting: *Deleted

## 2013-04-02 MED ORDER — CLOPIDOGREL BISULFATE 75 MG PO TABS: ORAL_TABLET | ORAL | Status: DC

## 2013-04-04 ENCOUNTER — Encounter: Payer: Self-pay | Admitting: Internal Medicine

## 2013-04-04 ENCOUNTER — Ambulatory Visit (INDEPENDENT_AMBULATORY_CARE_PROVIDER_SITE_OTHER): Payer: 59 | Admitting: Internal Medicine

## 2013-04-04 VITALS — BP 160/90 | HR 72 | Temp 98.8°F | Resp 16 | Wt 258.0 lb

## 2013-04-04 DIAGNOSIS — E538 Deficiency of other specified B group vitamins: Secondary | ICD-10-CM

## 2013-04-04 DIAGNOSIS — N529 Male erectile dysfunction, unspecified: Secondary | ICD-10-CM | POA: Insufficient documentation

## 2013-04-04 DIAGNOSIS — I10 Essential (primary) hypertension: Secondary | ICD-10-CM

## 2013-04-04 DIAGNOSIS — K219 Gastro-esophageal reflux disease without esophagitis: Secondary | ICD-10-CM

## 2013-04-04 DIAGNOSIS — R5381 Other malaise: Secondary | ICD-10-CM

## 2013-04-04 DIAGNOSIS — I2510 Atherosclerotic heart disease of native coronary artery without angina pectoris: Secondary | ICD-10-CM

## 2013-04-04 DIAGNOSIS — E7800 Pure hypercholesterolemia, unspecified: Secondary | ICD-10-CM

## 2013-04-04 MED ORDER — TRAMADOL HCL 50 MG PO TABS
50.0000 mg | ORAL_TABLET | Freq: Two times a day (BID) | ORAL | Status: DC
Start: 2013-04-04 — End: 2013-12-11

## 2013-04-04 MED ORDER — LOSARTAN POTASSIUM 100 MG PO TABS: 100.0000 mg | ORAL_TABLET | Freq: Every day | ORAL | Status: DC

## 2013-04-04 MED ORDER — SILDENAFIL CITRATE 100 MG PO TABS: 100.0000 mg | ORAL_TABLET | ORAL | Status: DC | PRN

## 2013-04-04 NOTE — Assessment & Plan Note (Signed)
Continue with current prescription therapy as reflected on the Med list.  

## 2013-04-04 NOTE — Assessment & Plan Note (Signed)
On Zetia 

## 2013-04-04 NOTE — Assessment & Plan Note (Signed)
Worse: Re-started Losartan

## 2013-04-04 NOTE — Assessment & Plan Note (Signed)
Potential benefits of prn viagra use as well as potential risks  and complications were explained to the patient and were aknowledged. No NTG See Rx

## 2013-04-04 NOTE — Patient Instructions (Signed)
Wt Readings from Last 3 Encounters:  04/04/13 258 lb (117.028 kg)  02/08/13 265 lb (120.203 kg)  01/25/13 272 lb (123.378 kg)

## 2013-04-04 NOTE — Progress Notes (Signed)
   Subjective:     HPI  The patient presents for a follow-up of  chronic hypertension, chronic dyslipidemia, PVD, CAD controlled with medicines. He started Tricor. He doesn't take  Lipitor. C/o ED. F/u bruising - resolved. C/o B IT pain. C/o fatigue from Metoprolol or other meds.  Wt Readings from Last 3 Encounters:  04/04/13 258 lb (117.028 kg)  02/08/13 265 lb (120.203 kg)  01/25/13 272 lb (123.378 kg)   BP Readings from Last 3 Encounters:  04/04/13 160/90  02/08/13 156/80  01/25/13 138/68     Review of Systems  Constitutional: Negative for appetite change, fatigue and unexpected weight change.  HENT: Negative for nosebleeds, congestion, sore throat, sneezing, trouble swallowing and neck pain.   Eyes: Negative for itching and visual disturbance.  Respiratory: Positive for cough.   Cardiovascular: Negative for palpitations and leg swelling.  Gastrointestinal: Negative for nausea, diarrhea, blood in stool and abdominal distention.  Genitourinary: Negative for frequency and hematuria.  Musculoskeletal: Negative for back pain, joint swelling and gait problem.  Skin: Negative for rash.  Neurological: Negative for dizziness, tremors, speech difficulty and weakness.  Psychiatric/Behavioral: Negative for suicidal ideas, sleep disturbance, dysphoric mood and agitation. The patient is not nervous/anxious.        Objective:   Physical Exam  Constitutional: He is oriented to person, place, and time. He appears well-developed.  Obese  HENT:  Mouth/Throat: Oropharynx is clear and moist.  Eyes: Conjunctivae are normal. Pupils are equal, round, and reactive to light.  Neck: Normal range of motion. No JVD present. No thyromegaly present.  Cardiovascular: Normal rate, regular rhythm, normal heart sounds and intact distal pulses.  Exam reveals no gallop and no friction rub.   No murmur heard. Pulmonary/Chest: Effort normal and breath sounds normal. No respiratory distress. He has no  wheezes. He has no rales. He exhibits no tenderness.  Abdominal: Soft. Bowel sounds are normal. He exhibits no distension and no mass. There is no tenderness. There is no rebound and no guarding.  Musculoskeletal: Normal range of motion. He exhibits no edema and no tenderness.  R lat foot oval ellastic mass 2x1.5 cm  Lymphadenopathy:    He has no cervical adenopathy.  Neurological: He is alert and oriented to person, place, and time. He has normal reflexes. No cranial nerve deficit. He exhibits normal muscle tone. Coordination normal.  Skin: Skin is warm and dry. No rash noted.  Psychiatric: He has a normal mood and affect. His behavior is normal. Judgment and thought content normal.  elbows mild discomfort -B lat epic  Lab Results  Component Value Date   WBC 7.5 08/01/2012   HGB 16.1 08/01/2012   HCT 47.4 08/01/2012   PLT 216.0 08/01/2012   GLUCOSE 77 04/01/2013   CHOL 138 04/01/2013   TRIG 198.0* 04/01/2013   HDL 22.00* 04/01/2013   LDLDIRECT 113.0 11/29/2012   LDLCALC 76 04/01/2013   ALT 25 11/29/2012   AST 21 11/29/2012   NA 137 04/01/2013   K 4.4 04/01/2013   CL 104 04/01/2013   CREATININE 1.0 04/01/2013   BUN 17 04/01/2013   CO2 28 04/01/2013   TSH 1.51 04/01/2013   PSA 0.48 11/03/2010   INR 1.02 06/06/2011   HGBA1C 5.7 05/09/2012        Assessment & Plan:

## 2013-04-04 NOTE — Assessment & Plan Note (Signed)
Multifactorial  Chronic

## 2013-06-10 ENCOUNTER — Other Ambulatory Visit: Payer: Self-pay | Admitting: Pulmonary Disease

## 2013-07-06 ENCOUNTER — Other Ambulatory Visit: Payer: Self-pay | Admitting: Internal Medicine

## 2013-07-09 ENCOUNTER — Ambulatory Visit: Payer: 59 | Admitting: Internal Medicine

## 2013-08-07 ENCOUNTER — Ambulatory Visit (INDEPENDENT_AMBULATORY_CARE_PROVIDER_SITE_OTHER): Payer: 59

## 2013-08-07 DIAGNOSIS — E538 Deficiency of other specified B group vitamins: Secondary | ICD-10-CM

## 2013-08-07 DIAGNOSIS — K219 Gastro-esophageal reflux disease without esophagitis: Secondary | ICD-10-CM

## 2013-08-07 DIAGNOSIS — E7800 Pure hypercholesterolemia, unspecified: Secondary | ICD-10-CM

## 2013-08-07 DIAGNOSIS — R5381 Other malaise: Secondary | ICD-10-CM

## 2013-08-07 DIAGNOSIS — I2510 Atherosclerotic heart disease of native coronary artery without angina pectoris: Secondary | ICD-10-CM

## 2013-08-07 DIAGNOSIS — I10 Essential (primary) hypertension: Secondary | ICD-10-CM

## 2013-08-07 LAB — LIPID PANEL
Cholesterol: 175 mg/dL (ref 0–200)
HDL: 28.6 mg/dL — ABNORMAL LOW (ref >39.00)
Total CHOL/HDL Ratio: 6
Triglycerides: 212 mg/dL — ABNORMAL HIGH (ref 0.0–149.0)
VLDL: 42.4 mg/dL — ABNORMAL HIGH (ref 0.0–40.0)

## 2013-08-07 LAB — BASIC METABOLIC PANEL
BUN: 20 mg/dL (ref 6–23)
CO2: 28 mEq/L (ref 19–32)
Calcium: 9.6 mg/dL (ref 8.4–10.5)
Chloride: 103 mEq/L (ref 96–112)
Creatinine, Ser: 0.9 mg/dL (ref 0.4–1.5)
GFR: 87.73 mL/min (ref >60.00)

## 2013-08-07 LAB — HEPATIC FUNCTION PANEL
ALT: 29 U/L (ref 0–53)
Albumin: 4.1 g/dL (ref 3.5–5.2)
Bilirubin, Direct: 0.1 mg/dL (ref 0.0–0.3)
Total Bilirubin: 0.6 mg/dL (ref 0.3–1.2)

## 2013-08-08 LAB — LDL CHOLESTEROL, DIRECT: Direct LDL: 111.7 mg/dL

## 2013-08-08 LAB — VITAMIN B12: Vitamin B-12: 644 pg/mL (ref 211–911)

## 2013-08-12 ENCOUNTER — Encounter: Payer: Self-pay | Admitting: Internal Medicine

## 2013-08-12 ENCOUNTER — Ambulatory Visit (INDEPENDENT_AMBULATORY_CARE_PROVIDER_SITE_OTHER): Payer: 59 | Admitting: Internal Medicine

## 2013-08-12 VITALS — BP 170/82 | HR 80 | Temp 99.0°F | Resp 16 | Wt 262.0 lb

## 2013-08-12 DIAGNOSIS — G4733 Obstructive sleep apnea (adult) (pediatric): Secondary | ICD-10-CM

## 2013-08-12 DIAGNOSIS — E538 Deficiency of other specified B group vitamins: Secondary | ICD-10-CM

## 2013-08-12 DIAGNOSIS — N529 Male erectile dysfunction, unspecified: Secondary | ICD-10-CM

## 2013-08-12 DIAGNOSIS — R51 Headache: Secondary | ICD-10-CM

## 2013-08-12 DIAGNOSIS — R519 Headache, unspecified: Secondary | ICD-10-CM | POA: Insufficient documentation

## 2013-08-12 DIAGNOSIS — I10 Essential (primary) hypertension: Secondary | ICD-10-CM

## 2013-08-12 DIAGNOSIS — M545 Low back pain, unspecified: Secondary | ICD-10-CM

## 2013-08-12 DIAGNOSIS — E291 Testicular hypofunction: Secondary | ICD-10-CM

## 2013-08-12 DIAGNOSIS — I2510 Atherosclerotic heart disease of native coronary artery without angina pectoris: Secondary | ICD-10-CM

## 2013-08-12 DIAGNOSIS — D171 Benign lipomatous neoplasm of skin and subcutaneous tissue of trunk: Secondary | ICD-10-CM

## 2013-08-12 DIAGNOSIS — D1779 Benign lipomatous neoplasm of other sites: Secondary | ICD-10-CM

## 2013-08-12 MED ORDER — AZILSARTAN MEDOXOMIL 80 MG PO TABS: 1.0000 | ORAL_TABLET | Freq: Every day | ORAL | Status: DC

## 2013-08-12 NOTE — Progress Notes (Signed)
   Subjective:     HPI  The patient presents for a follow-up of  chronic hypertension, chronic dyslipidemia, PVD, CAD controlled with medicines. He started Tricor. He doesn't take  Lipitor. F/u ED. F/u bruising - resolved. F/u B IT pain. C/o fatigue from Metoprolol or other meds. C/o HAs  Wt Readings from Last 3 Encounters:  08/12/13 262 lb (118.842 kg)  04/04/13 258 lb (117.028 kg)  02/08/13 265 lb (120.203 kg)   BP Readings from Last 3 Encounters:  08/12/13 170/82  04/04/13 160/90  02/08/13 156/80     Review of Systems  Constitutional: Negative for appetite change, fatigue and unexpected weight change.  HENT: Negative for congestion, nosebleeds, sneezing, sore throat and trouble swallowing.   Eyes: Negative for itching and visual disturbance.  Respiratory: Positive for cough.   Cardiovascular: Negative for palpitations and leg swelling.  Gastrointestinal: Negative for nausea, diarrhea, blood in stool and abdominal distention.  Genitourinary: Negative for frequency and hematuria.  Musculoskeletal: Negative for back pain, gait problem, joint swelling and neck pain.  Skin: Negative for rash.  Neurological: Negative for dizziness, tremors, speech difficulty and weakness.  Psychiatric/Behavioral: Negative for suicidal ideas, sleep disturbance, dysphoric mood and agitation. The patient is not nervous/anxious.        Objective:   Physical Exam  Constitutional: He is oriented to person, place, and time. He appears well-developed.  Obese  HENT:  Mouth/Throat: Oropharynx is clear and moist.  Eyes: Conjunctivae are normal. Pupils are equal, round, and reactive to light.  Neck: Normal range of motion. No JVD present. No thyromegaly present.  Cardiovascular: Normal rate, regular rhythm, normal heart sounds and intact distal pulses.  Exam reveals no gallop and no friction rub.   No murmur heard. Pulmonary/Chest: Effort normal and breath sounds normal. No respiratory distress. He has  no wheezes. He has no rales. He exhibits no tenderness.  Abdominal: Soft. Bowel sounds are normal. He exhibits no distension and no mass. There is no tenderness. There is no rebound and no guarding.  Musculoskeletal: Normal range of motion. He exhibits no edema and no tenderness.  R lat foot oval ellastic mass 2x1.5 cm  Lymphadenopathy:    He has no cervical adenopathy.  Neurological: He is alert and oriented to person, place, and time. He has normal reflexes. No cranial nerve deficit. He exhibits normal muscle tone. Coordination normal.  Skin: Skin is warm and dry. No rash noted.  Psychiatric: He has a normal mood and affect. His behavior is normal. Judgment and thought content normal.  Lipomas - chest  Lab Results  Component Value Date   WBC 7.5 08/01/2012   HGB 16.1 08/01/2012   HCT 47.4 08/01/2012   PLT 216.0 08/01/2012   GLUCOSE 102* 08/07/2013   CHOL 175 08/07/2013   TRIG 212.0* 08/07/2013   HDL 28.60* 08/07/2013   LDLDIRECT 111.7 08/07/2013   LDLCALC 76 04/01/2013   ALT 29 08/07/2013   AST 21 08/07/2013   NA 140 08/07/2013   K 4.5 08/07/2013   CL 103 08/07/2013   CREATININE 0.9 08/07/2013   BUN 20 08/07/2013   CO2 28 08/07/2013   TSH 2.49 08/07/2013   PSA 0.48 11/03/2010   INR 1.02 06/06/2011   HGBA1C 5.7 05/09/2012        Assessment & Plan:

## 2013-08-12 NOTE — Assessment & Plan Note (Signed)
See instructions

## 2013-08-12 NOTE — Assessment & Plan Note (Signed)
Continue with current prescription therapy as reflected on the Med list.  

## 2013-08-12 NOTE — Assessment & Plan Note (Signed)
R chest - symptomatic Dr Daphine Deutscher

## 2013-08-12 NOTE — Patient Instructions (Signed)
There are natural ways to boost your testosterone:  1. Lose Weight If you're overweight, shedding the excess pounds may increase your testosterone levels, according to multiple research. Overweight men are more likely to have low testosterone levels to begin with, so this is an important trick to increase your body's testosterone production when you need it most.   2. Strength Training    Strength training is also known to boost testosterone levels, provided you are doing so intensely enough. When strength training to boost testosterone, you'll want to increase the weight and lower your number of reps, and then focus on exercises that work a large number of muscles.  3. Optimize Your Vitamin D Levels Vitamin D, a steroid hormone, is essential for the healthy development of the nucleus of the sperm cell, and helps maintain semen quality and sperm count. Vitamin D also increases levels of testosterone, which may boost libido. In one study, overweight men who were given vitamin D supplements had a significant increase in testosterone levels after one year.  4. Reduce Stress When you're under a lot of stress, your body releases high levels of the stress hormone cortisol. This hormone actually blocks the effects of testosterone, presumably because, from a biological standpoint, testosterone-associated behaviors (mating, competing, aggression) may have lowered your chances of survival in an emergency (hence, the "fight or flight" response is dominant, courtesy of cortisol).  5. Limit or Eliminate Sugar from Your Diet Testosterone levels decrease after you eat sugar, which is likely because the sugar leads to a high insulin level, another factor leading to low testosterone.  6. Eat Healthy Fats By healthy, this means not only mon- and polyunsaturated fats, like that found in avocadoes and nuts, but also saturated, as these are essential for building testosterone. Research shows that a diet with less than  40 percent of energy as fat (and that mainly from animal sources, i.e. saturated) lead to a decrease in testosterone levels.  It's important to understand that your body requires saturated fats from animal and vegetable sources (such as meat, dairy, certain oils, and tropical plants like coconut) for optimal functioning, and if you neglect this important food group in favor of sugar, grains and other starchy carbs, your health and weight are almost guaranteed to suffer. Examples of healthy fats you can eat more of to give your testosterone levels a boost include:  Olives and Olive oil  Coconuts and coconut oil Butter made from organic milk  Raw nuts, such as, almonds or pecans Eggs Avocados   Meats Palm oil Unheated organic nut oils   7. "Testosterone boosters" containing Vitamin D-3, Niacin, Vitamin B-6, Vitamin B-12, Magnesium, Zinc, Selenium, D-Aspartic Acid, Fenugreed Seed Extract, Oystershell, [place] Extract, [place] Ginseng may be helpful as well.      Milk free trial (no milk, ice cream, cheese and yogurt) for 4-6 weeks. OK to use almond, coconut milk. "Almond breeze" brand tastes good.

## 2013-08-12 NOTE — Assessment & Plan Note (Signed)
Tart Edarbi 80 mg. D/c Losartan BP Readings from Last 3 Encounters:  08/12/13 170/82  04/04/13 160/90  02/08/13 156/80

## 2013-08-12 NOTE — Assessment & Plan Note (Signed)
Refractory  

## 2013-08-12 NOTE — Assessment & Plan Note (Signed)
Treat HTN 

## 2013-08-12 NOTE — Assessment & Plan Note (Signed)
On CPAP. ?

## 2013-08-14 ENCOUNTER — Ambulatory Visit (INDEPENDENT_AMBULATORY_CARE_PROVIDER_SITE_OTHER): Payer: 59 | Admitting: General Surgery

## 2013-08-14 ENCOUNTER — Encounter (INDEPENDENT_AMBULATORY_CARE_PROVIDER_SITE_OTHER): Payer: Self-pay

## 2013-08-14 VITALS — BP 134/80 | HR 84 | Temp 98.0°F | Resp 18 | Ht 70.0 in | Wt 259.0 lb

## 2013-08-14 DIAGNOSIS — R222 Localized swelling, mass and lump, trunk: Secondary | ICD-10-CM

## 2013-08-14 DIAGNOSIS — L723 Sebaceous cyst: Secondary | ICD-10-CM | POA: Insufficient documentation

## 2013-08-14 DIAGNOSIS — K644 Residual hemorrhoidal skin tags: Secondary | ICD-10-CM

## 2013-08-14 DIAGNOSIS — R229 Localized swelling, mass and lump, unspecified: Secondary | ICD-10-CM

## 2013-08-14 NOTE — Assessment & Plan Note (Signed)
May be benign vs malignant.    Will plan removal.

## 2013-08-14 NOTE — Patient Instructions (Signed)
We will get cardiac clearance from Dr. Tenny Craw.  Once this has occurred, we will schedule surgery.    Main risks are bleeding, infection, pain, wound complications, and issues with anesthesia.    I will see you back in clinic around 3 weeks post op unless you are having problems.

## 2013-08-14 NOTE — Assessment & Plan Note (Signed)
Will remove and use dissolvable sutures.    Will send for path.  Looks like old hemorrhoid, but may have condyloma on it.

## 2013-08-14 NOTE — Progress Notes (Signed)
Patient ID: [name], male   DOB: 17-Mar-1951, 62 y.o.   MRN: [id number]  Chief Complaint  Patient presents with  . New Evaluation    lipoma on rt lower back /shoulder    HPI [name] is a 62 y.o. male.   HPI  Patient is a 62 year old male who presents with multiple areas of concern.  He has had an area on his right upper back that has drained and filled multiple times. He says that when it drains it is extremely foul-smelling material that comes out of it.  It has been there for at least 2-3 years. The second lesion is on his mid right back. It's around a centimeter. He states that he noted that after he had thoracoscopic surgery.  It sends a sharp pain around his side when it is bumped.  He also complains of an anal skin tag that has been there for several years and is unchanged.    Past Medical History  Diagnosis Date  . Hiatal hernia   . Duodenitis   . Renal cyst   . Bell's palsy   . Hypercholesteremia   . Diverticulosis   . Cyst     sebacceous  . Renal cell carcinoma   . Bronchitis   . Low back pain   . Hypertension   . Hyperthyroidism   . Vitamin B12 deficiency   . PVD (peripheral vascular disease)     s/p right iliofem. bypass  . Pancreatitis   . GERD (gastroesophageal reflux disease)   . COPD (chronic obstructive pulmonary disease)   . Colon polyps   . Anxiety   . Coronary artery disease     a. s/p INF STEMI 4/12: DES x 2 (mid and dist RCA);  b. cath 4/12: oLAD 40%, mLAD 40%, pD1 30%, pOM1 40%, pOM3 50%, mRCA 80% (PCI), PDA 70% (med tx), PLB1 60%  (med tx); EF 55%  . Meralgia paresthetica     right  . Erectile dysfunction   . Chest pain, unspecified   . OSA (obstructive sleep apnea)     PSG 03/02/11>>AHI 28.6, SpO2 low 88%, CPAP 10 cm H2O.  Marland Kitchen Myocardial infarction 4/12    2 stents  . 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 Bypass graft      Rt.  iliofemoral   . Inguinal hernia repair      Rt.  . Tumor removal      Cryo Rena  . Colonoscopy    . Upper gastrointestinal endoscopy    . Video assisted thoracoscopy  July 2013    Rt upper and Rt lower lobe wedge resections    Family History  Problem Relation Age of Onset  . Colon cancer Paternal Grandmother   . Cancer Paternal Grandmother     colon  . Hypertension    . Stroke Father   . Hypertension Mother     Social History History  Substance Use Topics  . Smoking status: Passive Smoke Exposure - Never Smoker -- 1.00 packs/day for 40 years    Types: Cigarettes    Last Attempt to Quit: 02/15/2011  . Smokeless tobacco: Never Used     Comment: quit smoking in 2010 restarted 2011 1 ppd  . Alcohol Use: Yes     Comment: occasionally    Allergies  Allergen Reactions  . Pravastatin Sodium     REACTION:  gasy, constipated  . Rosuvastatin     REACTION: myalgias  . Simvastatin     REACTION: myalgia    Current Outpatient Prescriptions  Medication Sig Dispense Refill  . aspirin 81 MG tablet Take 81 mg by mouth daily.        . Azilsartan Medoxomil (EDARBI) 80 MG TABS Take 1 tablet (80 mg total) by mouth daily.  90 tablet  3  . Cholecalciferol (VITAMIN D3) 1000 UNITS CAPS Take 1 capsule by mouth daily.        . clopidogrel (PLAVIX) 75 MG tablet TAKE 1 TABLET (75 MG TOTAL) BY MOUTH DAILY.  90 tablet  0  . Coenzyme Q10 (CO Q-10) 200 MG CAPS Take 200 mg by mouth daily.        Marland Kitchen ezetimibe (ZETIA) 10 MG tablet Take 1 tablet (10 mg total) by mouth daily.  90 tablet  3  . hydrocortisone (ANUSOL-HC) 25 MG suppository prn      . nitroGLYCERIN (NITROSTAT) 0.4 MG SL tablet Place 1 tablet (0.4 mg total) under the tongue every 5 (five) minutes as needed for chest pain. Take 1 tablet for chest pains every 5 minutes up to 3 doses  25 tablet  6  . pantoprazole (PROTONIX) 40 MG tablet TAKE 1 TABLET DAILY  90 tablet  1  . sildenafil (VIAGRA) 100 MG tablet Take 1 tablet (100 mg total) by mouth  as needed for erectile dysfunction.  12 tablet  5  . SPIRIVA HANDIHALER 18 MCG inhalation capsule PLACE 1 CAPSULE (18 MCG TOTAL) INTO INHALER AND INHALE DAILY.  90 capsule  1  . traMADol (ULTRAM) 50 MG tablet Take 1 tablet (50 mg total) by mouth 2 (two) times daily. Take 1 to 2 tabs by mouth two times a day as needed  120 tablet  2  . triamcinolone (KENALOG) 0.5 % cream Apply topically 2 (two) times daily as needed.        . vitamin B-12 (CYANOCOBALAMIN) 1000 MCG tablet Take 1,000 mcg by mouth daily.        . vitamin C (ASCORBIC ACID) 500 MG tablet Take 500 mg by mouth daily.      Marland Kitchen amLODipine (NORVASC) 2.5 MG tablet Take 1 tablet (2.5 mg total) by mouth daily.  90 tablet  3  . metoprolol succinate (TOPROL-XL) 25 MG 24 hr tablet Take 1 tablet (25 mg total) by mouth daily. Take at hs  90 tablet  2   No current facility-administered medications for this visit.    Review of Systems Review of Systems  Respiratory: Positive for cough.   Neurological: Positive for headaches.    Blood pressure 134/80, pulse 84, temperature 98 F (36.7 C), resp. rate 18, height 5\' 10"  (1.778 m), weight 259 lb (117.482 kg).  Physical Exam Physical Exam  Constitutional: He is oriented to person, place, and time. He appears well-developed and well-nourished. No distress.  HENT:  Head: Normocephalic and atraumatic.  Eyes: Conjunctivae are normal. Pupils are equal, round, and reactive to light. No scleral icterus.  Neck: Normal range of motion. Neck supple. No tracheal deviation present. No thyromegaly present.  Cardiovascular: Normal rate, regular rhythm, normal heart sounds and intact distal pulses.   Pulmonary/Chest: Effort normal and breath sounds normal. No respiratory distress. He has no wheezes.    Upper one has several pits and is around 1.5 cm.  Lower one is firm and mobile.  Around 1 cm.  Abdominal: Soft. Bowel sounds are normal. He exhibits no distension.  There is no tenderness. There is no rebound.    Genitourinary: Rectal exam shows external hemorrhoid.     Large pedunculated skin tag. ? Condyloma on part of it.    Musculoskeletal: Normal range of motion. He exhibits no edema and no tenderness.  Lymphadenopathy:    He has no cervical adenopathy.  Neurological: He is alert and oriented to person, place, and time. Coordination normal.  Skin: Skin is warm and dry. No rash noted. He is not diaphoretic. No erythema.  Psychiatric: He has a normal mood and affect. His behavior is normal. Judgment and thought content normal.    Data Reviewed n/a  Assessment/Plan    *Sebaceous cyst of right upper back We'll plan removal. I advised the patient that this would be reasonable and comfortable. I advised him not to go back to work the following day.  Mass on back right mid back subcutaneous May be benign vs malignant.    Will plan removal.    Anal skin tag Will remove and use dissolvable sutures.    Will send for path.  Looks like old hemorrhoid, but may have condyloma on it.     Since there are three separate lesions and he is on blood thinners, i will do all of these in the OR.  We could do with MAC and local.     [name] 08/14/2013, 2:19 PM

## 2013-08-14 NOTE — Assessment & Plan Note (Signed)
We'll plan removal. I advised the patient that this would be reasonable and comfortable. I advised him not to go back to work the following day.

## 2013-08-28 ENCOUNTER — Telehealth (INDEPENDENT_AMBULATORY_CARE_PROVIDER_SITE_OTHER): Payer: Self-pay

## 2013-08-28 NOTE — Telephone Encounter (Signed)
Pt notified cardiac clearance rec'd from Dr. Dietrich Pates.  Orders sent to surgery scheduling.

## 2013-09-05 ENCOUNTER — Other Ambulatory Visit: Payer: Self-pay

## 2013-09-11 ENCOUNTER — Encounter (HOSPITAL_COMMUNITY): Payer: Self-pay | Admitting: Pharmacy Technician

## 2013-09-13 ENCOUNTER — Encounter (HOSPITAL_COMMUNITY)
Admission: RE | Admit: 2013-09-13 | Discharge: 2013-09-13 | Disposition: A | Discharge status: Home / Self Care | Payer: 59 | Source: Ambulatory Visit | Attending: General Surgery | Admitting: General Surgery

## 2013-09-13 ENCOUNTER — Ambulatory Visit (HOSPITAL_COMMUNITY)
Admission: RE | Admit: 2013-09-13 | Discharge: 2013-09-13 | Disposition: A | Discharge status: Home / Self Care | Payer: 59 | Source: Ambulatory Visit | Attending: Anesthesiology | Admitting: Anesthesiology

## 2013-09-13 ENCOUNTER — Other Ambulatory Visit: Payer: Self-pay | Admitting: Internal Medicine

## 2013-09-13 ENCOUNTER — Encounter (HOSPITAL_COMMUNITY): Payer: Self-pay

## 2013-09-13 DIAGNOSIS — Z85528 Personal history of other malignant neoplasm of kidney: Secondary | ICD-10-CM | POA: Insufficient documentation

## 2013-09-13 DIAGNOSIS — F17200 Nicotine dependence, unspecified, uncomplicated: Secondary | ICD-10-CM | POA: Insufficient documentation

## 2013-09-13 DIAGNOSIS — I252 Old myocardial infarction: Secondary | ICD-10-CM | POA: Insufficient documentation

## 2013-09-13 DIAGNOSIS — Z9861 Coronary angioplasty status: Secondary | ICD-10-CM | POA: Insufficient documentation

## 2013-09-13 DIAGNOSIS — E669 Obesity, unspecified: Secondary | ICD-10-CM | POA: Insufficient documentation

## 2013-09-13 DIAGNOSIS — K644 Residual hemorrhoidal skin tags: Secondary | ICD-10-CM | POA: Insufficient documentation

## 2013-09-13 DIAGNOSIS — Z01818 Encounter for other preprocedural examination: Secondary | ICD-10-CM | POA: Insufficient documentation

## 2013-09-13 LAB — CBC
Hemoglobin: 16.4 g/dL (ref 13.0–17.0)
Platelets: 202 10*3/uL (ref 150–400)
RBC: 4.98 MIL/uL (ref 4.22–5.81)

## 2013-09-13 LAB — BASIC METABOLIC PANEL
Chloride: 104 mEq/L (ref 96–112)
GFR calc Af Amer: >90 mL/min (ref >90)
GFR calc non Af Amer: >90 mL/min (ref >90)
Potassium: 4.2 mEq/L (ref 3.5–5.1)
Sodium: 140 mEq/L (ref 135–145)

## 2013-09-13 NOTE — Pre-Procedure Instructions (Addendum)
Javone Ybanez Pence  09/13/2013   Your procedure is scheduled on:  November 20  Report to [facility] Entrance "A" [address] at 11:15 AM.  Call this number if you have problems the morning of surgery: [phone]   Remember:   Do not eat food or drink liquids after midnight.   Take these medicines the morning of surgery with A SIP OF WATER: Protonix, Spiriva, Tramadol (if needed)   Buy a fleets enema over the counter and use the night before.   STOP Vitamin C, Vitamin B12, Ibuprofen, Co Q10, Plavix, Vitamin D3, Aspirin today   STOP Aspirin, Aleve, Naproxen, Advil, Ibuprofen, Vitamin, Herbs, or Supplements starting today   Do not wear jewelry, make-up or nail polish.  Do not wear lotions, powders, or perfumes. You may wear deodorant.  Do not shave 48 hours prior to surgery. Men may shave face and neck.  Do not bring valuables to the hospital.  [facility] is not responsible                  for any belongings or valuables.               Contacts, dentures or bridgework may not be worn into surgery.  Leave suitcase in the car. After surgery it may be brought to your room.  For patients admitted to the hospital, discharge time is determined by your                treatment team.               Patients discharged the day of surgery will not be allowed to drive  home.  Name and phone number of your driver: Carmaine  Special Instructions: Shower using CHG 2 nights before surgery and the night before surgery.  If you shower the day of surgery use CHG.  Use special wash - you have one bottle of CHG for all showers.  You should use approximately 1/3 of the bottle for each shower.   Please read over the following fact sheets that you were given: Pain Booklet, Coughing and Deep Breathing and Surgical Site Infection Prevention

## 2013-09-16 ENCOUNTER — Ambulatory Visit (INDEPENDENT_AMBULATORY_CARE_PROVIDER_SITE_OTHER): Payer: 59 | Admitting: Pulmonary Disease

## 2013-09-16 ENCOUNTER — Telehealth (INDEPENDENT_AMBULATORY_CARE_PROVIDER_SITE_OTHER): Payer: Self-pay

## 2013-09-16 ENCOUNTER — Encounter: Payer: Self-pay | Admitting: Pulmonary Disease

## 2013-09-16 VITALS — BP 140/88 | HR 80 | Ht 70.0 in | Wt 261.0 lb

## 2013-09-16 DIAGNOSIS — J438 Other emphysema: Secondary | ICD-10-CM

## 2013-09-16 DIAGNOSIS — J8482 Adult pulmonary Langerhans cell histiocytosis: Secondary | ICD-10-CM

## 2013-09-16 DIAGNOSIS — J439 Emphysema, unspecified: Secondary | ICD-10-CM

## 2013-09-16 DIAGNOSIS — J209 Acute bronchitis, unspecified: Secondary | ICD-10-CM

## 2013-09-16 DIAGNOSIS — F17200 Nicotine dependence, unspecified, uncomplicated: Secondary | ICD-10-CM

## 2013-09-16 DIAGNOSIS — G4733 Obstructive sleep apnea (adult) (pediatric): Secondary | ICD-10-CM

## 2013-09-16 MED ORDER — CEFUROXIME AXETIL 500 MG PO TABS: 500.0000 mg | ORAL_TABLET | Freq: Two times a day (BID) | ORAL | Status: DC

## 2013-09-16 NOTE — Progress Notes (Signed)
Chief Complaint  Patient presents with  . Sleep Apnea    Currently using CPAP every night. Feels rested the next day after use. Denies problems with machine, mask or pressure.  Marland Kitchen COPD    Breathing is doing well. Report SOB with heavy exertion. Denies coughing and chest tightness.    History of Present Illness: [name] is a 62 y.o. male smoker with COPD, Langerhans histiocytosis, and OSA.  He continues to smoke 3/4 pack per day.  He has developed a sinus cold.  He is getting sinus pressure, post-nasal drip, and cough with yellow sputum.  He is wheezing sometimes.  He has been getting more short of breath walking up stairs.  He denies fever, or abdominal symptoms.  He has been getting headache when using his CPAP.  Otherwise his CPAP has been working well.  Tests: PSG 03/02/11>>AHI 28.6, SpO2 low 88%, CPAP 10 cm H2O. 05/23/12 Rt VATS bx>>Langerhans cell histiocytosis RUL and RLL nodules CPAP 11/01/11 to 07/31/12>>Used on 223 of 274 nights with average 5 hrs 27 min.  Average AHI 1.3 with CPAP 10 cm H2O. CT chest 08/14/12>>scattered b/l pulmonary nodules no change since 2009, mild centrilobular emphysema PFT 08/30/12>>FEV1 2.64 (81%) , FEV1% 69, TLC 5.52 (83%), DLCO 67%, no BD  [name]  has a past medical history of Hiatal hernia; Duodenitis; Renal cyst; Bell's palsy; Hypercholesteremia; Diverticulosis; Cyst; Renal cell carcinoma; Bronchitis; Low back pain; Hypertension; Vitamin B12 deficiency; PVD (peripheral vascular disease); Pancreatitis; GERD (gastroesophageal reflux disease); COPD (chronic obstructive pulmonary disease); Colon polyps; Coronary artery disease; Meralgia paresthetica; Erectile dysfunction; Chest pain, unspecified; OSA (obstructive sleep apnea); Myocardial infarction (4/12); and 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  [name]  has past surgical history that includes Bypass Graft; Inguinal hernia repair; Tumor removal; Colonoscopy; Upper  gastrointestinal endoscopy; Video assisted thoracoscopy (July 2013); and Coronary angioplasty (01/2011).   Outpatient Encounter Prescriptions as of 09/16/2013  Medication Sig  . aspirin 81 MG tablet Take 81 mg by mouth daily.   . Azilsartan Medoxomil (EDARBI) 80 MG TABS Take 1 tablet (80 mg total) by mouth daily.  . Cholecalciferol (VITAMIN D3) 1000 UNITS CAPS Take 1 capsule by mouth daily.    . clopidogrel (PLAVIX) 75 MG tablet Take 75 mg by mouth daily with breakfast.  . Coenzyme Q10 (CO Q-10) 200 MG CAPS Take 200 mg by mouth daily.   Marland Kitchen ezetimibe (ZETIA) 10 MG tablet Take 10 mg by mouth daily.  Marland Kitchen ibuprofen (ADVIL,MOTRIN) 200 MG tablet Take 400 mg by mouth daily as needed for headache.  . nitroGLYCERIN (NITROSTAT) 0.4 MG SL tablet Place 1 tablet (0.4 mg total) under the tongue every 5 (five) minutes as needed for chest pain. Take 1 tablet for chest pains every 5 minutes up to 3 doses  . pantoprazole (PROTONIX) 40 MG tablet TAKE 1 TABLET DAILY  . tiotropium (SPIRIVA) 18 MCG inhalation capsule Place 18 mcg into inhaler and inhale daily.  . traMADol (ULTRAM) 50 MG tablet Take 1 tablet (50 mg total) by mouth 2 (two) times daily. Take 1 to 2 tabs by mouth two times a day as needed  . vitamin B-12 (CYANOCOBALAMIN) 1000 MCG tablet Take 1,000 mcg by mouth daily.   . vitamin C (ASCORBIC ACID) 500 MG tablet Take 500 mg by mouth daily.  . [DISCONTINUED] metoprolol succinate (TOPROL-XL) 25 MG 24 hr tablet Take 1 tablet (25 mg total) by mouth daily. Take at hs  . [DISCONTINUED] pantoprazole (PROTONIX) 40 MG tablet Take 40  mg by mouth daily.    Allergies  Allergen Reactions  . Pravastatin Sodium     REACTION: gasy, constipated  . Rosuvastatin     REACTION: myalgias  . Simvastatin     REACTION: myalgia    Physical Exam:  General - No distress ENT - No sinus tenderness, no oral exudate, no LAN, boggy nasal mucosa, maxillary sinus tenderness Cardiac - s1s2 regular, no murmur Chest - no  wheezing Back - no focal tenderness Abd - soft, non-tender Ext - no edema Neuro - normal strength Skin - no rashes Psych - normal mood, and behavior  09/13/2013    CLINICAL DATA:  62 year old male preoperative study for cyst removal. Initial encounter.   EXAM: CHEST  2 VIEW   COMPARISON:  Chest CT 08/14/2012 and earlier.   FINDINGS: Stable somewhat low lung volumes. Stable mild diffuse interstitial prominence. Normal cardiac size and mediastinal contours. Visualized tracheal air column is within normal limits. No pneumothorax, pulmonary edema, pleural effusion or confluent pulmonary opacity. No acute osseous abnormality identified.   IMPRESSION:  No acute cardiopulmonary abnormality.    Electronically Signed   By: Augusto Gamble M.D.   On: 09/13/2013 17:00     Assessment/Plan:  Coralyn Helling, MD [facility] Pager:  [phone] 09/16/2013, 5:03 PM

## 2013-09-16 NOTE — Patient Instructions (Signed)
Cefuroxime 500 mg pills >> one pills twice daily for 7 days Try nasal irrigation (saline nasal rinse)  Will arrange for refit of CPAP mask Follow up in 6 months

## 2013-09-16 NOTE — Progress Notes (Addendum)
Anesthesia Chart Review:  Patient is a 62 year old male scheduled for excision of right back mass X 2 and anal skin tag on 09/19/13 by Dr. Donell Beers.  History includes obesity, CAD/inferior MI s/p mid and distal RCA DES 01/2011, renal cell carcinoma s/p left retroperitoneal laparoscopic renal cryoablation 08/23/07 ([place]), pancreatitis, smoking, hypercholesterolemia, PAD s/p right FPBG, GERD, COPD, OSA with CPAP use, ED, right meralgia paresthetica, RUL/RML lung resection '13 for diagnosis of Langerhan's cell histoiocytosis 04/2012, Bell's palsy, hiatal hernia, diverticulosis, right IHR.    PCP is Dr. Posey Rea.  Pulmonologist is Dr. Craige Cotta. Cardiologist is Dr. Dietrich Pates, last visit 02/08/13 with one year follow-up recommended.  Telephone message from [facility] in [place] indicates that Dr. Tenny Craw did give cardiac clearance for this procedure.   Cardiac cath on 06/06/11 showed: 1. The right coronary artery. His previous artery has been intervened  upon. I have reviewed the old films. There is a stent in the mid vessel that has a slight eccentricity at a bend point, but with no more than about 30-40% narrowing and does appear to be widely patent without evidence of thrombus. The distally placed stent in the distal right coronary artery also appears to be widely patent without significant high-grade narrowing of more than 20%. There was a diffuse plaque of 50-60%, possibly 70% in the midportion of the PDA, which is a relatively small caliber vessel. The posterolateral artery is without significant compromise.  2. The left main is without critical disease.  3. The LAD is heavily calcified proximally, and there is a fair amount of plaque in the proximal vessel, but this does not appear to exceed 40%. Multiple views were obtained. The distal vessel wraps down to the apex and has a fair amount of mild luminal irregularity.  4. There is a ramus intermedius with some mild irregularity, but no critical narrowing.  5. The  circumflex system has a marginal branch that has a somewhat distal 70% segmental plaque similar to the previous study. The AV circumflex provides a second marginal or posterolateral branch without critical narrowing.   EKG on 02/08/13 showed: NSR, inferior infarct (age undetermined), possible anterolateral infarct (age undetermined).   PFT 08/30/12>>FEV1 2.64 (81%) , FEV1% 69, TLC 5.52 (83%), DLCO 67%, no BD.  Preoperative CXR and labs noted.  Patient is scheduled for back mass and anal skin tag removal.  Anesthesia is posted for choice.  He has seen cardiology within the past year, and cardiac cath 06/2011 showed patent patent stents.  Reportedly, he has been cleared by cardiology. ([facility] to scan of fax note to PAT.)  If no acute changes then I would anticipate that he could proceed as planned.  [facility] Short Stay Center/Anesthesiology Phone [phone] 09/16/2013 12:01 PM  Addendum: 09/17/2013 4:54 PM Milas Hock, CMA with [facility] called to confirm they did get clearance from Dr. Tenny Craw as outlined in her telephone message from 08/28/13.  There is no other note to fax.  As above, would anticipate that he could proceed.  If any other questions, these will have to be further addressed with Dr. Donell Beers and/or Dr. Tenny Craw tomorrow.

## 2013-09-16 NOTE — Telephone Encounter (Signed)
The PA from [facility] called and they don't see the cardiac clearance in EPIC.  Please have it scanned in or fax to [phone]

## 2013-09-17 ENCOUNTER — Other Ambulatory Visit: Payer: Self-pay | Admitting: *Deleted

## 2013-09-17 MED ORDER — AZILSARTAN MEDOXOMIL 80 MG PO TABS: 1.0000 | ORAL_TABLET | Freq: Every day | ORAL | Status: DC

## 2013-09-18 ENCOUNTER — Telehealth: Payer: Self-pay | Admitting: Internal Medicine

## 2013-09-18 MED ORDER — CEFAZOLIN SODIUM-DEXTROSE 2-3 GM-% IV SOLR
2.0000 g | INTRAVENOUS | Status: AC
  Administered 2013-09-19: 2 g via INTRAVENOUS

## 2013-09-18 NOTE — Telephone Encounter (Signed)
Spoke with [name], clearance discussed. See telephone note from 08-28-13

## 2013-09-18 NOTE — Progress Notes (Signed)
Just spoke with Gildford Colony from [facility]. She has informed me that Bernie from [facility] is still trying to get clearance from Dr. Tenny Craw' office.  We will need to check proficient again in the AM.. DA

## 2013-09-18 NOTE — Telephone Encounter (Signed)
I have sent a message to dr Tenny Craw to review this chart for clearance

## 2013-09-18 NOTE — Telephone Encounter (Signed)
[name] from [facility] called asking if we have clearance for surgery. Per Cyndra Numbers she is working with cardiologist trying to get this. She will stat scan once she receives or have note sent via epic.

## 2013-09-18 NOTE — Telephone Encounter (Signed)
New problem   Office need a call back today.     Fax results / epic sent on  10/15  To Dr. Tenny Craw .   Patient having surgery on  11/20. Please advise. Last office visit on  4/11/.

## 2013-09-18 NOTE — Telephone Encounter (Signed)
Spoke with Revonda Standard in Anesthesia to confirm that cardiac clearance was received from Dr. Tenny Craw.  Documentation from Dr. Tenny Craw not found in Epic.  Patient stopped his Plavix last week in time for his surgery on 09/19/13.

## 2013-09-19 ENCOUNTER — Ambulatory Visit (HOSPITAL_COMMUNITY)
Admission: RE | Admit: 2013-09-19 | Discharge: 2013-09-19 | Disposition: A | Discharge status: Home / Self Care | Payer: 59 | Source: Ambulatory Visit | Attending: General Surgery | Admitting: General Surgery

## 2013-09-19 ENCOUNTER — Ambulatory Visit (HOSPITAL_COMMUNITY): Payer: 59 | Admitting: Anesthesiology

## 2013-09-19 ENCOUNTER — Encounter (HOSPITAL_COMMUNITY): Payer: 59 | Admitting: Vascular Surgery

## 2013-09-19 ENCOUNTER — Encounter (HOSPITAL_COMMUNITY)
Admission: RE | Disposition: A | Discharge status: Home / Self Care | Payer: Self-pay | Source: Ambulatory Visit | Attending: General Surgery

## 2013-09-19 ENCOUNTER — Encounter (HOSPITAL_COMMUNITY): Payer: Self-pay | Admitting: Anesthesiology

## 2013-09-19 DIAGNOSIS — K644 Residual hemorrhoidal skin tags: Secondary | ICD-10-CM | POA: Insufficient documentation

## 2013-09-19 DIAGNOSIS — J449 Chronic obstructive pulmonary disease, unspecified: Secondary | ICD-10-CM | POA: Insufficient documentation

## 2013-09-19 DIAGNOSIS — Z7982 Long term (current) use of aspirin: Secondary | ICD-10-CM | POA: Insufficient documentation

## 2013-09-19 DIAGNOSIS — L723 Sebaceous cyst: Secondary | ICD-10-CM | POA: Insufficient documentation

## 2013-09-19 DIAGNOSIS — I2510 Atherosclerotic heart disease of native coronary artery without angina pectoris: Secondary | ICD-10-CM | POA: Insufficient documentation

## 2013-09-19 DIAGNOSIS — I252 Old myocardial infarction: Secondary | ICD-10-CM | POA: Insufficient documentation

## 2013-09-19 DIAGNOSIS — Z85528 Personal history of other malignant neoplasm of kidney: Secondary | ICD-10-CM | POA: Insufficient documentation

## 2013-09-19 DIAGNOSIS — J4489 Other specified chronic obstructive pulmonary disease: Secondary | ICD-10-CM | POA: Insufficient documentation

## 2013-09-19 DIAGNOSIS — K648 Other hemorrhoids: Secondary | ICD-10-CM | POA: Insufficient documentation

## 2013-09-19 DIAGNOSIS — G4733 Obstructive sleep apnea (adult) (pediatric): Secondary | ICD-10-CM | POA: Insufficient documentation

## 2013-09-19 DIAGNOSIS — K219 Gastro-esophageal reflux disease without esophagitis: Secondary | ICD-10-CM | POA: Insufficient documentation

## 2013-09-19 DIAGNOSIS — F17200 Nicotine dependence, unspecified, uncomplicated: Secondary | ICD-10-CM | POA: Insufficient documentation

## 2013-09-19 DIAGNOSIS — G5710 Meralgia paresthetica, unspecified lower limb: Secondary | ICD-10-CM | POA: Insufficient documentation

## 2013-09-19 DIAGNOSIS — I10 Essential (primary) hypertension: Secondary | ICD-10-CM | POA: Insufficient documentation

## 2013-09-19 DIAGNOSIS — Z9861 Coronary angioplasty status: Secondary | ICD-10-CM | POA: Insufficient documentation

## 2013-09-19 HISTORY — PX: EXCISION OF SKIN TAG: SHX6270

## 2013-09-19 HISTORY — PX: MASS EXCISION: SHX2000

## 2013-09-19 SURGERY — EXCISION MASS
Anesthesia: General | Site: Back | Laterality: Right | Wound class: Clean

## 2013-09-19 MED ORDER — DOCUSATE SODIUM 100 MG PO CAPS: 100.0000 mg | ORAL_CAPSULE | Freq: Two times a day (BID) | ORAL | Status: DC

## 2013-09-19 MED ORDER — LIDOCAINE HCL (PF) 1 % IJ SOLN
INTRAMUSCULAR | Status: AC
  Filled 2013-09-19: qty 30

## 2013-09-19 MED ORDER — ALBUTEROL SULFATE HFA 108 (90 BASE) MCG/ACT IN AERS
INHALATION_SPRAY | RESPIRATORY_TRACT | Status: DC | PRN
  Administered 2013-09-19 (×2): 6 via RESPIRATORY_TRACT

## 2013-09-19 MED ORDER — BUPIVACAINE LIPOSOME 1.3 % IJ SUSP
INTRAMUSCULAR | Status: DC | PRN
  Administered 2013-09-19: 20 mL

## 2013-09-19 MED ORDER — DIBUCAINE 1 % RE OINT
TOPICAL_OINTMENT | RECTAL | Status: AC
  Filled 2013-09-19: qty 28

## 2013-09-19 MED ORDER — ALBUMIN HUMAN 5 % IV SOLN
INTRAVENOUS | Status: DC | PRN
  Administered 2013-09-19: 13:00:00 via INTRAVENOUS

## 2013-09-19 MED ORDER — ONDANSETRON HCL 4 MG/2ML IJ SOLN: 4.0000 mg | Freq: Once | INTRAMUSCULAR | Status: DC | PRN

## 2013-09-19 MED ORDER — LACTATED RINGERS IV SOLN: INTRAVENOUS | Status: DC

## 2013-09-19 MED ORDER — PHENYLEPHRINE HCL 10 MG/ML IJ SOLN
10.0000 mg | INTRAVENOUS | Status: DC | PRN
  Administered 2013-09-19: 40 ug/min via INTRAVENOUS

## 2013-09-19 MED ORDER — ROCURONIUM BROMIDE 100 MG/10ML IV SOLN
INTRAVENOUS | Status: DC | PRN
  Administered 2013-09-19: 40 mg via INTRAVENOUS

## 2013-09-19 MED ORDER — BUPIVACAINE LIPOSOME 1.3 % IJ SUSP
20.0000 mL | INTRAMUSCULAR | Status: DC
  Filled 2013-09-19: qty 20

## 2013-09-19 MED ORDER — CEFAZOLIN SODIUM-DEXTROSE 2-3 GM-% IV SOLR
INTRAVENOUS | Status: AC
  Filled 2013-09-19: qty 50

## 2013-09-19 MED ORDER — LIDOCAINE HCL 1 % IJ SOLN
INTRAMUSCULAR | Status: DC | PRN
  Administered 2013-09-19: 13:00:00 via INTRAMUSCULAR

## 2013-09-19 MED ORDER — ARTIFICIAL TEARS OP OINT
TOPICAL_OINTMENT | OPHTHALMIC | Status: DC | PRN
  Administered 2013-09-19: 1 via OPHTHALMIC

## 2013-09-19 MED ORDER — KETOROLAC TROMETHAMINE 30 MG/ML IJ SOLN: 15.0000 mg | Freq: Once | INTRAMUSCULAR | Status: DC | PRN

## 2013-09-19 MED ORDER — CHLORHEXIDINE GLUCONATE 4 % EX LIQD: 1.0000 "application " | Freq: Once | CUTANEOUS | Status: DC

## 2013-09-19 MED ORDER — HYDROMORPHONE HCL PF 1 MG/ML IJ SOLN: 0.2500 mg | INTRAMUSCULAR | Status: DC | PRN

## 2013-09-19 MED ORDER — LIDOCAINE HCL (CARDIAC) 20 MG/ML IV SOLN
INTRAVENOUS | Status: DC | PRN
  Administered 2013-09-19: 40 mg via INTRAVENOUS

## 2013-09-19 MED ORDER — DEXTROSE 5 % IV SOLN
INTRAVENOUS | Status: DC | PRN
  Administered 2013-09-19: 12:00:00 via INTRAVENOUS

## 2013-09-19 MED ORDER — ONDANSETRON HCL 4 MG/2ML IJ SOLN
INTRAMUSCULAR | Status: DC | PRN
  Administered 2013-09-19: 4 mg via INTRAVENOUS

## 2013-09-19 MED ORDER — OXYCODONE-ACETAMINOPHEN 5-325 MG PO TABS: 1.0000 | ORAL_TABLET | ORAL | Status: DC | PRN

## 2013-09-19 MED ORDER — MIDAZOLAM HCL 5 MG/5ML IJ SOLN
INTRAMUSCULAR | Status: DC | PRN
  Administered 2013-09-19 (×2): 1 mg via INTRAVENOUS

## 2013-09-19 MED ORDER — PROPOFOL 10 MG/ML IV BOLUS
INTRAVENOUS | Status: DC | PRN
  Administered 2013-09-19: 30 mg via INTRAVENOUS
  Administered 2013-09-19: 170 mg via INTRAVENOUS

## 2013-09-19 MED ORDER — LACTATED RINGERS IV SOLN
INTRAVENOUS | Status: DC | PRN
  Administered 2013-09-19 (×3): via INTRAVENOUS

## 2013-09-19 MED ORDER — PHENYLEPHRINE HCL 10 MG/ML IJ SOLN
INTRAMUSCULAR | Status: DC | PRN
  Administered 2013-09-19: 80 ug via INTRAVENOUS
  Administered 2013-09-19: 40 ug via INTRAVENOUS
  Administered 2013-09-19 (×2): 80 ug via INTRAVENOUS
  Administered 2013-09-19: 40 ug via INTRAVENOUS
  Administered 2013-09-19: 80 ug via INTRAVENOUS
  Administered 2013-09-19 (×2): 40 ug via INTRAVENOUS

## 2013-09-19 MED ORDER — FENTANYL CITRATE 0.05 MG/ML IJ SOLN
INTRAMUSCULAR | Status: DC | PRN
  Administered 2013-09-19 (×4): 50 ug via INTRAVENOUS

## 2013-09-19 MED ORDER — 0.9 % SODIUM CHLORIDE (POUR BTL) OPTIME
TOPICAL | Status: DC | PRN
  Administered 2013-09-19: 1000 mL

## 2013-09-19 SURGICAL SUPPLY — 53 items
ADH SKN CLS APL DERMABOND .7 (GAUZE/BANDAGES/DRESSINGS)
APL SKNCLS STERI-STRIP NONHPOA (GAUZE/BANDAGES/DRESSINGS) ×2
BENZOIN TINCTURE PRP APPL 2/3 (GAUZE/BANDAGES/DRESSINGS) ×3 IMPLANT
BLADE SURG 10 STRL SS (BLADE) ×3 IMPLANT
BLADE SURG 15 STRL LF DISP TIS (BLADE) ×2 IMPLANT
BLADE SURG 15 STRL SS (BLADE) ×3
CANISTER SUCTION 2500CC (MISCELLANEOUS) ×3 IMPLANT
CHLORAPREP W/TINT 26ML (MISCELLANEOUS) ×3 IMPLANT
COVER SURGICAL LIGHT HANDLE (MISCELLANEOUS) ×3 IMPLANT
DERMABOND ADVANCED (GAUZE/BANDAGES/DRESSINGS)
DERMABOND ADVANCED .7 DNX12 (GAUZE/BANDAGES/DRESSINGS) IMPLANT
DRAPE LAPAROSCOPIC ABDOMINAL (DRAPES) ×1 IMPLANT
DRAPE PED LAPAROTOMY (DRAPES) ×1 IMPLANT
DRAPE UTILITY 15X26 W/TAPE STR (DRAPE) ×6 IMPLANT
DRSG TEGADERM 4X4.75 (GAUZE/BANDAGES/DRESSINGS) ×2 IMPLANT
ELECT CAUTERY BLADE 6.4 (BLADE) ×3 IMPLANT
ELECT REM PT RETURN 9FT ADLT (ELECTROSURGICAL) ×3
ELECTRODE REM PT RTRN 9FT ADLT (ELECTROSURGICAL) ×2 IMPLANT
GAUZE SPONGE 4X4 16PLY XRAY LF (GAUZE/BANDAGES/DRESSINGS) ×3 IMPLANT
GLOVE BIO SURGEON STRL SZ 6 (GLOVE) ×3 IMPLANT
GLOVE BIOGEL PI IND STRL 6.5 (GLOVE) ×2 IMPLANT
GLOVE BIOGEL PI IND STRL 7.0 (GLOVE) IMPLANT
GLOVE BIOGEL PI IND STRL 7.5 (GLOVE) IMPLANT
GLOVE BIOGEL PI INDICATOR 6.5 (GLOVE) ×2
GLOVE BIOGEL PI INDICATOR 7.0 (GLOVE) ×1
GLOVE BIOGEL PI INDICATOR 7.5 (GLOVE) ×1
GLOVE SURG SS PI 6.5 STRL IVOR (GLOVE) ×1 IMPLANT
GOWN PREVENTION PLUS XXLARGE (GOWN DISPOSABLE) ×3 IMPLANT
GOWN STRL NON-REIN LRG LVL3 (GOWN DISPOSABLE) ×4 IMPLANT
KIT BASIN OR (CUSTOM PROCEDURE TRAY) ×3 IMPLANT
KIT ROOM TURNOVER OR (KITS) ×3 IMPLANT
NDL HYPO 25GX1X1/2 BEV (NEEDLE) ×2 IMPLANT
NEEDLE HYPO 25GX1X1/2 BEV (NEEDLE) ×3 IMPLANT
NS IRRIG 1000ML POUR BTL (IV SOLUTION) ×3 IMPLANT
PACK SURGICAL SETUP 50X90 (CUSTOM PROCEDURE TRAY) ×3 IMPLANT
PAD ARMBOARD 7.5X6 YLW CONV (MISCELLANEOUS) ×6 IMPLANT
PENCIL BUTTON HOLSTER BLD 10FT (ELECTRODE) ×3 IMPLANT
SPECIMEN JAR SMALL (MISCELLANEOUS) ×3 IMPLANT
SPONGE GAUZE 4X4 12PLY (GAUZE/BANDAGES/DRESSINGS) ×1 IMPLANT
STRIP CLOSURE SKIN 1/2X4 (GAUZE/BANDAGES/DRESSINGS) ×1 IMPLANT
SUT CHROMIC 4 0 P 3 18 (SUTURE) ×1 IMPLANT
SUT MON AB 4-0 PC3 18 (SUTURE) ×2 IMPLANT
SUT SILK 2 0 FS (SUTURE) IMPLANT
SUT VIC AB 3-0 SH 27 (SUTURE) ×3
SUT VIC AB 3-0 SH 27X BRD (SUTURE) ×2 IMPLANT
SUT VIC AB 3-0 SH 8-18 (SUTURE) ×2 IMPLANT
SYR BULB 3OZ (MISCELLANEOUS) ×3 IMPLANT
SYR CONTROL 10ML LL (SYRINGE) ×3 IMPLANT
TOWEL OR 17X24 6PK STRL BLUE (TOWEL DISPOSABLE) ×3 IMPLANT
TOWEL OR 17X26 10 PK STRL BLUE (TOWEL DISPOSABLE) ×3 IMPLANT
TUBE CONNECTING 12X1/4 (SUCTIONS) ×1 IMPLANT
WATER STERILE IRR 1000ML POUR (IV SOLUTION) IMPLANT
YANKAUER SUCT BULB TIP NO VENT (SUCTIONS) ×1 IMPLANT

## 2013-09-19 NOTE — Anesthesia Procedure Notes (Signed)
Procedure Name: Intubation Date/Time: 09/19/2013 12:20 PM Performed by: Darcey Nora B Pre-anesthesia Checklist: Patient identified, Emergency Drugs available, Patient being monitored and Suction available Patient Re-evaluated:Patient Re-evaluated prior to inductionOxygen Delivery Method: Circle system utilized Preoxygenation: Pre-oxygenation with 100% oxygen Intubation Type: IV induction Ventilation: Oral airway inserted - appropriate to patient size and Mask ventilation without difficulty Laryngoscope Size: Mac and 4 Grade View: Grade II Tube type: Oral Tube size: 7.5 mm Number of attempts: 1 Airway Equipment and Method: Stylet Placement Confirmation: ETT inserted through vocal cords under direct vision,  breath sounds checked- equal and bilateral and positive ETCO2 Secured at: 22 (cm at teeth) cm Tube secured with: Tape Dental Injury: Teeth and Oropharynx as per pre-operative assessment

## 2013-09-19 NOTE — Preoperative (Signed)
Beta Blockers   Reason not to administer Beta Blockers:Not Applicable 

## 2013-09-19 NOTE — Op Note (Signed)
PRE-OPERATIVE DIAGNOSIS: 1 cm right upper back mass, 2 cm subcutaneous back mass located right mid back, large pedunculated anterior anal skin tag  POST-OPERATIVE DIAGNOSIS:  Same  PROCEDURE:  Procedure(s): Excision right upper back mass 1.5 cm subcutaneous, excision right mid back mass 2 cm subcutaneous, rectal exam under anesthesia with excision of anterior skin tag/hemorrhoid and small firm skin tag  SURGEON:  Surgeon(s): Almond Lint, MD  ANESTHESIA:   local and general  DRAINS: none   LOCAL MEDICATIONS USED:  BUPIVICAINE  and LIDOCAINE   SPECIMEN:  Source of Specimen:  right upper back mass, right mid back mass, anterior rectal skin tag, posterior skin tag  DISPOSITION OF SPECIMEN:  PATHOLOGY  COUNTS:  YES  DICTATION: .Dragon Dictation  PLAN OF CARE: Discharge to home after PACU  PATIENT DISPOSITION:  PACU - hemodynamically stable.   EBL:  min  FINDINGS:  Firm mass right mid back, sebaceous cyst upper back with 2 pits, pedunculated anal skin tag.    PROCEDURE:    Pt was identified in the holding area and taken to the operating room where he was intubated on the stretcher. He was flipped into prone position. Care was taken to pad him appropriately.  His back and perianal region were prepped and draped appropriately.  Time out was performed according to the surgical safety checklist.  When all was correct, we continued.    The mid back lesion was addressed first.  The skin was infiltrated with local.  A transverse incision was made over the lesion.  It was dissected from the surrounding tissues with the cautery.  The wound was irrigated.  The skin was then closed with 3-0 vicryl deep dermal sutures and 4-0 monocryl subcuticular sutures.    The upper back lesion was addressed next.  Based on the history of this lesion, this is a sebaceous cyst.  The skin pits were excised in an elliptical fashion along with the mass.  A kocher clamp was used to elevate the ellipse and the  cautery was used to dissect around the mass.  Care was taken not to enter the mass.  This went down to the muscle.  The wound was irrigated.  The skin was then closed with 3-0 vicryl deep dermal sutures and 4-0 monocryl subcuticular sutures.    Wounds were cleaned, dried, and dressed with benzoin, steristrips, gauze, and tegaderm.    The anal area was addressed next.  Exparel was injected around the anus.  The anterior skin tag was grasped and elevated with a pennington clamp.  This was excised with a #11 blade.  The anoscope was used to examine the anus.  Small circumferential internal hemorrhoids were seen.  A 4-0 chromic was used to close the defect. A smaller firm skin tag was also excised posteriorly.  This was also closed with a 4-0 chromic.    The wound was dressed with dibucaine cream.    He was flipped, extubated, and taken to pacu in stable condition.   Needle, sponge, and instrument counts were correct times 2.

## 2013-09-19 NOTE — Anesthesia Preprocedure Evaluation (Addendum)
Anesthesia Evaluation  Patient identified by MRN, date of birth, ID band Patient awake    Reviewed: Allergy & Precautions, H&P , NPO status , Patient's Chart, lab work & pertinent test results, reviewed documented beta blocker date and time   Airway Mallampati: II TM Distance: >3 FB Neck ROM: Full    Dental  (+) Teeth Intact, Caps and Dental Advisory Given   Pulmonary sleep apnea and Continuous Positive Airway Pressure Ventilation , COPDCurrent Smoker,  breath sounds clear to auscultation        Cardiovascular hypertension, Pt. on medications Rhythm:Regular Rate:Normal     Neuro/Psych    GI/Hepatic GERD-  Medicated and Controlled,  Endo/Other    Renal/GU      Musculoskeletal   Abdominal (+) + obese,   Peds  Hematology   Anesthesia Other Findings   Reproductive/Obstetrics                          Anesthesia Physical Anesthesia Plan  ASA: III  Anesthesia Plan: General   Post-op Pain Management:    Induction: Intravenous  Airway Management Planned: Oral ETT  Additional Equipment:   Intra-op Plan:   Post-operative Plan:   Informed Consent: I have reviewed the patients History and Physical, chart, labs and discussed the procedure including the risks, benefits and alternatives for the proposed anesthesia with the patient or authorized representative who has indicated his/her understanding and acceptance.   Dental advisory given  Plan Discussed with: CRNA and Anesthesiologist  Anesthesia Plan Comments: (CAD S/P IWMI 01/2011  Treated with PTCA with DES x 2 RCA EF 55% no angina since Htn Sleep apnea on CPAP Smoker/COPD H/O renal cell Ca H/O langerhans histiocytosis S/P R lung wedge resection  Plan GA with oral ETT  Kipp Brood)        Anesthesia Quick Evaluation

## 2013-09-19 NOTE — Transfer of Care (Signed)
Immediate Anesthesia Transfer of Care Note  Patient: [name]  Procedure(s) Performed: Procedure(s): Excision back mass x2 (Right) EXCISION OF SKIN TAG (N/A)  Patient Location: PACU  Anesthesia Type:General  Level of Consciousness: awake and patient cooperative  Airway & Oxygen Therapy: Patient Spontanous Breathing and Patient connected to face mask oxygen  Post-op Assessment: Report given to PACU RN, Post -op Vital signs reviewed and stable and Patient moving all extremities  Post vital signs: Reviewed and stable  Complications: No apparent anesthesia complications

## 2013-09-19 NOTE — H&P (Signed)
[name] is an 62 y.o. male.   Chief Complaint: 2 back masses, anal skin tag HPI:  Pt is a 62 yo male with 2 back masses and an anal "growth."  The upper back mass has been infected multiple times, but not recently.  The lower one is tender and felt like it might be enlarging a bit.  The anal growth is very irritating and causes problems cleaning.    Past Medical History  Diagnosis Date  . Hiatal hernia   . Duodenitis   . Renal cyst   . Bell's palsy   . Hypercholesteremia   . Diverticulosis   . Cyst     sebacceous  . Renal cell carcinoma   . Bronchitis   . Low back pain   . Hypertension   . Vitamin B12 deficiency   . PVD (peripheral vascular disease)     s/p right iliofem. bypass  . Pancreatitis   . GERD (gastroesophageal reflux disease)   . COPD (chronic obstructive pulmonary disease)   . Colon polyps   . Coronary artery disease     a. s/p INF STEMI 4/12: DES x 2 (mid and dist RCA);  b. cath 4/12: oLAD 40%, mLAD 40%, pD1 30%, pOM1 40%, pOM3 50%, mRCA 80% (PCI), PDA 70% (med tx), PLB1 60%  (med tx); EF 55%  . Meralgia paresthetica     right  . Erectile dysfunction   . Chest pain, unspecified   . OSA (obstructive sleep apnea)     PSG 03/02/11>>AHI 28.6, SpO2 low 88%, CPAP 10 cm H2O.  Marland Kitchen Myocardial infarction 4/12    2 stents  . 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 Bypass graft      Rt. iliofemoral   . Inguinal hernia repair      Rt.  . Tumor removal      Cryo Rena  . Colonoscopy    . Upper gastrointestinal endoscopy    . Video assisted thoracoscopy  July 2013    Rt upper and Rt lower lobe wedge resections  . Coronary angioplasty  01/2011    Family History  Problem Relation Age of Onset  . Colon cancer Paternal Grandmother   . Cancer Paternal Grandmother     colon  . Hypertension    . Stroke Father   . Hypertension Mother    Social History:  reports  that he has been smoking Cigarettes.  He has a 30 pack-year smoking history. He has never used smokeless tobacco. He reports that he drinks alcohol. He reports that he does not use illicit drugs.  Allergies:  Allergies  Allergen Reactions  . Pravastatin Sodium     REACTION: gasy, constipated  . Rosuvastatin     REACTION: myalgias  . Simvastatin     REACTION: myalgia    Medications Prior to Admission  Medication Sig Dispense Refill  . aspirin 81 MG tablet Take 81 mg by mouth daily.       . Azilsartan Medoxomil (EDARBI) 80 MG TABS Take 1 tablet (80 mg total) by mouth daily.  90 tablet  3  . cefUROXime (CEFTIN) 500 MG tablet Take 1 tablet (500 mg total) by mouth 2 (two) times daily with a meal.  14 tablet  0  . Cholecalciferol (VITAMIN D3) 1000 UNITS CAPS Take 1 capsule by mouth daily.        . clopidogrel (  PLAVIX) 75 MG tablet Take 75 mg by mouth daily with breakfast.      . Coenzyme Q10 (CO Q-10) 200 MG CAPS Take 200 mg by mouth daily.       Marland Kitchen ezetimibe (ZETIA) 10 MG tablet Take 10 mg by mouth daily.      Marland Kitchen ibuprofen (ADVIL,MOTRIN) 200 MG tablet Take 400 mg by mouth daily as needed for headache.      Marland Kitchen oxymetazoline (AFRIN) 0.05 % nasal spray Place 1 spray into both nostrils 2 (two) times daily.      . pantoprazole (PROTONIX) 40 MG tablet TAKE 1 TABLET DAILY  90 tablet  1  . tiotropium (SPIRIVA) 18 MCG inhalation capsule Place 18 mcg into inhaler and inhale daily.      . traMADol (ULTRAM) 50 MG tablet Take 1 tablet (50 mg total) by mouth 2 (two) times daily. Take 1 to 2 tabs by mouth two times a day as needed  120 tablet  2  . vitamin B-12 (CYANOCOBALAMIN) 1000 MCG tablet Take 1,000 mcg by mouth daily.       . vitamin C (ASCORBIC ACID) 500 MG tablet Take 500 mg by mouth daily.      . nitroGLYCERIN (NITROSTAT) 0.4 MG SL tablet Place 1 tablet (0.4 mg total) under the tongue every 5 (five) minutes as needed for chest pain. Take 1 tablet for chest pains every 5 minutes up to 3 doses  25  tablet  6    No results found for this or any previous visit (from the past 48 hour(s)). No results found.  Review of Systems  Constitutional: Negative.   HENT:       Finishing up antibiotics for possible sinus infection.  Gastrointestinal: Negative.   Neurological: Negative.   All other systems reviewed and are negative.    Blood pressure 174/86, pulse 89, temperature 97.4 F (36.3 C), temperature source Oral, resp. rate 20, SpO2 97.00%. Physical Exam  Constitutional: He is oriented to person, place, and time. He appears well-developed and well-nourished. No distress.  HENT:  Head: Normocephalic and atraumatic.  Eyes: Pupils are equal, round, and reactive to light. No scleral icterus.  Slightly puffy  Cardiovascular: Normal rate.   Respiratory: Effort normal. No respiratory distress.    Upper mass has 2 pits consistent with sebaceous cyst.  1 cm diameter.    Lower mass more firm, mobile.  Fully subcutaneous.  2 cm  GI: Soft.  Genitourinary:     Neurological: He is alert and oriented to person, place, and time.  Skin: Skin is warm and dry. He is not diaphoretic.  Psychiatric: He has a normal mood and affect. His behavior is normal. Judgment and thought content normal.     Assessment/Plan 2 back masses, anal skin tag.   Will remove all.  Advised main risks are bleeding, infection, wound complications.   Discussed constipation.  Reviewed that he should take stool softeners while on narcotics.    Questions answered. No lifting or strenuous activity for 1 week.   [name] 09/19/2013, 11:49 AM

## 2013-09-20 ENCOUNTER — Encounter (HOSPITAL_COMMUNITY): Payer: Self-pay | Admitting: General Surgery

## 2013-09-20 NOTE — Anesthesia Postprocedure Evaluation (Signed)
  Anesthesia Post-op Note  Patient: [name]  Procedure(s) Performed: Procedure(s): Excision back mass x2 (Right) EXCISION OF SKIN TAG (N/A)  Patient Location: PACU  Anesthesia Type:General  Level of Consciousness: awake, alert  and oriented  Airway and Oxygen Therapy: Patient Spontanous Breathing  Post-op Pain: mild  Post-op Assessment: Post-op Vital signs reviewed, Patient's Cardiovascular Status Stable, Respiratory Function Stable, Patent Airway and Pain level controlled  Post-op Vital Signs: stable  Complications: No apparent anesthesia complications

## 2013-09-20 NOTE — Telephone Encounter (Signed)
Per dr Tenny Craw Patient seen in April 2014 Doing fine at time If no CP OK to procceed with surgery He should resum plavix when safe after. Please forward to surgery. Will forward to bernie

## 2013-09-23 ENCOUNTER — Telehealth: Payer: Self-pay | Admitting: *Deleted

## 2013-09-23 DIAGNOSIS — J209 Acute bronchitis, unspecified: Secondary | ICD-10-CM | POA: Insufficient documentation

## 2013-09-23 NOTE — Progress Notes (Signed)
Quick Note:  Pathology all benign. Please call patient. ______

## 2013-09-23 NOTE — Assessment & Plan Note (Signed)
Stable on spiriva 

## 2013-09-23 NOTE — Assessment & Plan Note (Signed)
Most recent CXR was stable.  Defer f/u CT chest imaging, and PFT for now.

## 2013-09-23 NOTE — Telephone Encounter (Signed)
PA for [name] is approved from 09/20/13 until 09/20/2014. Pt informed

## 2013-09-23 NOTE — Assessment & Plan Note (Signed)
Again explained the relationship with smoking and his lung disease and absolute importance of complete smoking cessation.  Offered assistance with this.

## 2013-09-23 NOTE — Assessment & Plan Note (Addendum)
He reports compliance with therapy and benefit from CPAP.  Use of nasal pillows mask might be contributing to his sinus problems.  Will arrange for mask refit.

## 2013-09-23 NOTE — Assessment & Plan Note (Signed)
Will give him course of cefuroxime.  He also has component of sinusitis >> advised him to try using nasal irrigation.

## 2013-09-25 ENCOUNTER — Telehealth (INDEPENDENT_AMBULATORY_CARE_PROVIDER_SITE_OTHER): Payer: Self-pay

## 2013-09-25 NOTE — Telephone Encounter (Signed)
Pt is s/p excision of back mass x2 by Dr. Donell Beers on 09/19/13. Pt's girlfriend calling this morning to report that pt's back wound has been draining clear, odorless fluid for past  24 hours.  No redness, swelling or pain at site.  Pt has no fever or chills. Second incision is fine.  She was instructed to keep the area clean and dry, and not to apply any ointments to the wound. Pt going out of town for the weekend. Instructions given to call our physician on call if any changes to the wound and/or drainage, fever or chills, or questions and concerns.

## 2013-10-14 ENCOUNTER — Encounter (INDEPENDENT_AMBULATORY_CARE_PROVIDER_SITE_OTHER): Payer: 59 | Admitting: General Surgery

## 2013-10-18 ENCOUNTER — Encounter (INDEPENDENT_AMBULATORY_CARE_PROVIDER_SITE_OTHER): Payer: 59 | Admitting: General Surgery

## 2013-10-31 ENCOUNTER — Other Ambulatory Visit: Payer: Self-pay | Admitting: Internal Medicine

## 2013-11-05 ENCOUNTER — Other Ambulatory Visit: Payer: Self-pay | Admitting: *Deleted

## 2013-11-05 DIAGNOSIS — I2510 Atherosclerotic heart disease of native coronary artery without angina pectoris: Secondary | ICD-10-CM

## 2013-11-05 MED ORDER — NITROGLYCERIN 0.4 MG SL SUBL: 0.4000 mg | SUBLINGUAL_TABLET | SUBLINGUAL | Status: DC | PRN

## 2013-11-07 ENCOUNTER — Encounter (INDEPENDENT_AMBULATORY_CARE_PROVIDER_SITE_OTHER): Payer: 59 | Admitting: General Surgery

## 2013-11-14 ENCOUNTER — Encounter: Payer: Self-pay | Admitting: Internal Medicine

## 2013-11-14 ENCOUNTER — Ambulatory Visit: Payer: 59 | Admitting: Internal Medicine

## 2013-11-14 ENCOUNTER — Ambulatory Visit: Payer: 59

## 2013-11-14 ENCOUNTER — Ambulatory Visit (INDEPENDENT_AMBULATORY_CARE_PROVIDER_SITE_OTHER): Payer: 59 | Admitting: Internal Medicine

## 2013-11-14 VITALS — BP 148/94 | HR 92 | Temp 98.3°F | Resp 16 | Wt 260.0 lb

## 2013-11-14 DIAGNOSIS — E0590 Thyrotoxicosis, unspecified without thyrotoxic crisis or storm: Secondary | ICD-10-CM

## 2013-11-14 DIAGNOSIS — Z23 Encounter for immunization: Secondary | ICD-10-CM

## 2013-11-14 DIAGNOSIS — M722 Plantar fascial fibromatosis: Secondary | ICD-10-CM

## 2013-11-14 DIAGNOSIS — E538 Deficiency of other specified B group vitamins: Secondary | ICD-10-CM

## 2013-11-14 DIAGNOSIS — I2510 Atherosclerotic heart disease of native coronary artery without angina pectoris: Secondary | ICD-10-CM

## 2013-11-14 DIAGNOSIS — K219 Gastro-esophageal reflux disease without esophagitis: Secondary | ICD-10-CM

## 2013-11-14 DIAGNOSIS — I10 Essential (primary) hypertension: Secondary | ICD-10-CM

## 2013-11-14 LAB — CBC WITH DIFFERENTIAL/PLATELET
BASOS PCT: 0.5 % (ref 0.0–3.0)
Basophils Absolute: 0.1 10*3/uL (ref 0.0–0.1)
EOS PCT: 1.3 % (ref 0.0–5.0)
Eosinophils Absolute: 0.1 10*3/uL (ref 0.0–0.7)
HCT: 49.4 % (ref 39.0–52.0)
Hemoglobin: 16.7 g/dL (ref 13.0–17.0)
Lymphocytes Relative: 30.3 % (ref 12.0–46.0)
Lymphs Abs: 3.3 10*3/uL (ref 0.7–4.0)
MCHC: 33.8 g/dL (ref 30.0–36.0)
MCV: 91.2 fl (ref 78.0–100.0)
MONOS PCT: 8.6 % (ref 3.0–12.0)
Monocytes Absolute: 0.9 10*3/uL (ref 0.1–1.0)
Neutro Abs: 6.4 10*3/uL (ref 1.4–7.7)
Neutrophils Relative %: 59.3 % (ref 43.0–77.0)
Platelets: 246 10*3/uL (ref 150.0–400.0)
RBC: 5.42 Mil/uL (ref 4.22–5.81)
RDW: 14.1 % (ref 11.5–14.6)
WBC: 10.8 10*3/uL — AB (ref 4.5–10.5)

## 2013-11-14 MED ORDER — NAPROXEN 500 MG PO TABS: 500.0000 mg | ORAL_TABLET | Freq: Two times a day (BID) | ORAL | Status: DC

## 2013-11-14 NOTE — Assessment & Plan Note (Signed)
Arch support NSAID  ice

## 2013-11-14 NOTE — Progress Notes (Signed)
Pre visit review using our clinic review tool, if applicable. No additional management support is needed unless otherwise documented below in the visit note. 

## 2013-11-14 NOTE — Assessment & Plan Note (Signed)
Continue with current prescription therapy as reflected on the Med list.  

## 2013-11-14 NOTE — Progress Notes (Signed)
   Subjective:     HPI  The patient presents for a follow-up of  chronic hypertension, chronic dyslipidemia, PVD, CAD controlled with medicines. He started Tricor. He doesn't take  Lipitor. F/u ED. F/u bruising - resolved. F/u B IT pain. C/o fatigue from Metoprolol or other meds. C/o HAs  Wt Readings from Last 3 Encounters:  11/14/13 260 lb (117.935 kg)  09/16/13 261 lb (118.389 kg)  09/13/13 263 lb 8 oz (119.523 kg)   BP Readings from Last 3 Encounters:  11/14/13 148/94  09/19/13 159/72  09/19/13 159/72     Review of Systems  Constitutional: Negative for appetite change, fatigue and unexpected weight change.  HENT: Negative for congestion, nosebleeds, sneezing, sore throat and trouble swallowing.   Eyes: Negative for itching and visual disturbance.  Respiratory: Positive for cough.   Cardiovascular: Negative for palpitations and leg swelling.  Gastrointestinal: Negative for nausea, diarrhea, blood in stool and abdominal distention.  Genitourinary: Negative for frequency and hematuria.  Musculoskeletal: Negative for back pain, gait problem, joint swelling and neck pain.  Skin: Negative for rash.  Neurological: Negative for dizziness, tremors, speech difficulty and weakness.  Psychiatric/Behavioral: Negative for suicidal ideas, sleep disturbance, dysphoric mood and agitation. The patient is not nervous/anxious.        Objective:   Physical Exam  Constitutional: He is oriented to person, place, and time. He appears well-developed.  Obese  HENT:  Mouth/Throat: Oropharynx is clear and moist.  Eyes: Conjunctivae are normal. Pupils are equal, round, and reactive to light.  Neck: Normal range of motion. No JVD present. No thyromegaly present.  Cardiovascular: Normal rate, regular rhythm, normal heart sounds and intact distal pulses.  Exam reveals no gallop and no friction rub.   No murmur heard. Pulmonary/Chest: Effort normal and breath sounds normal. No respiratory distress. He  has no wheezes. He has no rales. He exhibits no tenderness.  Abdominal: Soft. Bowel sounds are normal. He exhibits no distension and no mass. There is no tenderness. There is no rebound and no guarding.  Musculoskeletal: Normal range of motion. He exhibits no edema and no tenderness.  R lat foot oval ellastic mass 2x1.5 cm  Lymphadenopathy:    He has no cervical adenopathy.  Neurological: He is alert and oriented to person, place, and time. He has normal reflexes. No cranial nerve deficit. He exhibits normal muscle tone. Coordination normal.  Skin: Skin is warm and dry. No rash noted.  Psychiatric: He has a normal mood and affect. His behavior is normal. Judgment and thought content normal.  Lipomas - chest  R heel is tender  Lab Results  Component Value Date   WBC 9.2 09/13/2013   HGB 16.4 09/13/2013   HCT 46.2 09/13/2013   PLT 202 09/13/2013   GLUCOSE 91 09/13/2013   CHOL 175 08/07/2013   TRIG 212.0* 08/07/2013   HDL 28.60* 08/07/2013   LDLDIRECT 111.7 08/07/2013   LDLCALC 76 04/01/2013   ALT 29 08/07/2013   AST 21 08/07/2013   NA 140 09/13/2013   K 4.2 09/13/2013   CL 104 09/13/2013   CREATININE 0.89 09/13/2013   BUN 15 09/13/2013   CO2 25 09/13/2013   TSH 2.49 08/07/2013   PSA 0.48 11/03/2010   INR 1.02 06/06/2011   HGBA1C 5.7 05/09/2012        Assessment & Plan:

## 2013-11-14 NOTE — Patient Instructions (Signed)
Arch support ice 

## 2013-11-15 ENCOUNTER — Other Ambulatory Visit (INDEPENDENT_AMBULATORY_CARE_PROVIDER_SITE_OTHER): Payer: 59

## 2013-11-15 DIAGNOSIS — E0590 Thyrotoxicosis, unspecified without thyrotoxic crisis or storm: Secondary | ICD-10-CM

## 2013-11-15 DIAGNOSIS — I2510 Atherosclerotic heart disease of native coronary artery without angina pectoris: Secondary | ICD-10-CM

## 2013-11-15 DIAGNOSIS — I10 Essential (primary) hypertension: Secondary | ICD-10-CM

## 2013-11-15 LAB — BASIC METABOLIC PANEL
BUN: 15 mg/dL (ref 6–23)
CO2: 29 mEq/L (ref 19–32)
CREATININE: 1 mg/dL (ref 0.4–1.5)
Calcium: 9.5 mg/dL (ref 8.4–10.5)
Chloride: 105 mEq/L (ref 96–112)
GFR: 85.52 mL/min (ref >60.00)
Glucose, Bld: 94 mg/dL (ref 70–99)
Potassium: 4.5 mEq/L (ref 3.5–5.1)
Sodium: 139 mEq/L (ref 135–145)

## 2013-11-15 LAB — LIPID PANEL
Cholesterol: 167 mg/dL (ref 0–200)
HDL: 28.2 mg/dL — ABNORMAL LOW
LDL Cholesterol: 102 mg/dL — ABNORMAL HIGH (ref 0–99)
Total CHOL/HDL Ratio: 6
Triglycerides: 185 mg/dL — ABNORMAL HIGH (ref 0.0–149.0)
VLDL: 37 mg/dL (ref 0.0–40.0)

## 2013-11-15 LAB — AST: AST: 22 U/L (ref 0–37)

## 2013-11-21 ENCOUNTER — Other Ambulatory Visit: Payer: Self-pay | Admitting: *Deleted

## 2013-11-21 DIAGNOSIS — E7800 Pure hypercholesterolemia, unspecified: Secondary | ICD-10-CM

## 2013-11-21 MED ORDER — PITAVASTATIN CALCIUM 2 MG PO TABS: 2.0000 mg | ORAL_TABLET | Freq: Every day | ORAL | Status: DC

## 2013-12-11 ENCOUNTER — Telehealth: Payer: Self-pay | Admitting: Internal Medicine

## 2013-12-11 ENCOUNTER — Encounter (HOSPITAL_COMMUNITY): Payer: Self-pay | Admitting: Emergency Medicine

## 2013-12-11 ENCOUNTER — Emergency Department (HOSPITAL_COMMUNITY)
Admission: EM | Admit: 2013-12-11 | Discharge: 2013-12-11 | Disposition: A | Discharge status: Home / Self Care | Payer: 59 | Attending: Emergency Medicine | Admitting: Emergency Medicine

## 2013-12-11 DIAGNOSIS — Z85528 Personal history of other malignant neoplasm of kidney: Secondary | ICD-10-CM | POA: Insufficient documentation

## 2013-12-11 DIAGNOSIS — G4733 Obstructive sleep apnea (adult) (pediatric): Secondary | ICD-10-CM | POA: Insufficient documentation

## 2013-12-11 DIAGNOSIS — Z8669 Personal history of other diseases of the nervous system and sense organs: Secondary | ICD-10-CM | POA: Insufficient documentation

## 2013-12-11 DIAGNOSIS — Z9981 Dependence on supplemental oxygen: Secondary | ICD-10-CM | POA: Insufficient documentation

## 2013-12-11 DIAGNOSIS — E7800 Pure hypercholesterolemia, unspecified: Secondary | ICD-10-CM | POA: Insufficient documentation

## 2013-12-11 DIAGNOSIS — Z7982 Long term (current) use of aspirin: Secondary | ICD-10-CM | POA: Insufficient documentation

## 2013-12-11 DIAGNOSIS — Z951 Presence of aortocoronary bypass graft: Secondary | ICD-10-CM | POA: Insufficient documentation

## 2013-12-11 DIAGNOSIS — F17200 Nicotine dependence, unspecified, uncomplicated: Secondary | ICD-10-CM | POA: Insufficient documentation

## 2013-12-11 DIAGNOSIS — Z9861 Coronary angioplasty status: Secondary | ICD-10-CM | POA: Insufficient documentation

## 2013-12-11 DIAGNOSIS — I10 Essential (primary) hypertension: Secondary | ICD-10-CM | POA: Insufficient documentation

## 2013-12-11 DIAGNOSIS — K219 Gastro-esophageal reflux disease without esophagitis: Secondary | ICD-10-CM | POA: Insufficient documentation

## 2013-12-11 DIAGNOSIS — I252 Old myocardial infarction: Secondary | ICD-10-CM | POA: Insufficient documentation

## 2013-12-11 DIAGNOSIS — I2510 Atherosclerotic heart disease of native coronary artery without angina pectoris: Secondary | ICD-10-CM | POA: Insufficient documentation

## 2013-12-11 DIAGNOSIS — Z860100 Personal history of colon polyps, unspecified: Secondary | ICD-10-CM | POA: Insufficient documentation

## 2013-12-11 DIAGNOSIS — H538 Other visual disturbances: Secondary | ICD-10-CM | POA: Insufficient documentation

## 2013-12-11 DIAGNOSIS — E538 Deficiency of other specified B group vitamins: Secondary | ICD-10-CM | POA: Insufficient documentation

## 2013-12-11 DIAGNOSIS — Z79899 Other long term (current) drug therapy: Secondary | ICD-10-CM | POA: Insufficient documentation

## 2013-12-11 DIAGNOSIS — Z7902 Long term (current) use of antithrombotics/antiplatelets: Secondary | ICD-10-CM | POA: Insufficient documentation

## 2013-12-11 DIAGNOSIS — J4489 Other specified chronic obstructive pulmonary disease: Secondary | ICD-10-CM | POA: Insufficient documentation

## 2013-12-11 DIAGNOSIS — J449 Chronic obstructive pulmonary disease, unspecified: Secondary | ICD-10-CM | POA: Insufficient documentation

## 2013-12-11 DIAGNOSIS — Q619 Cystic kidney disease, unspecified: Secondary | ICD-10-CM | POA: Insufficient documentation

## 2013-12-11 DIAGNOSIS — Z791 Long term (current) use of non-steroidal anti-inflammatories (NSAID): Secondary | ICD-10-CM | POA: Insufficient documentation

## 2013-12-11 LAB — CBC WITH DIFFERENTIAL/PLATELET
BASOS ABS: 0 10*3/uL (ref 0.0–0.1)
Basophils Relative: 1 % (ref 0–1)
EOS PCT: 3 % (ref 0–5)
Eosinophils Absolute: 0.2 10*3/uL (ref 0.0–0.7)
HCT: 49.5 % (ref 39.0–52.0)
Hemoglobin: 17.2 g/dL — ABNORMAL HIGH (ref 13.0–17.0)
Lymphocytes Relative: 35 % (ref 12–46)
Lymphs Abs: 3.1 10*3/uL (ref 0.7–4.0)
MCH: 32.1 pg (ref 26.0–34.0)
MCHC: 34.7 g/dL (ref 30.0–36.0)
MCV: 92.5 fL (ref 78.0–100.0)
Monocytes Absolute: 0.6 10*3/uL (ref 0.1–1.0)
Monocytes Relative: 7 % (ref 3–12)
NEUTROS ABS: 4.7 10*3/uL (ref 1.7–7.7)
Neutrophils Relative %: 55 % (ref 43–77)
PLATELETS: 187 10*3/uL (ref 150–400)
RBC: 5.35 MIL/uL (ref 4.22–5.81)
RDW: 13.9 % (ref 11.5–15.5)
WBC: 8.7 10*3/uL (ref 4.0–10.5)

## 2013-12-11 LAB — COMPREHENSIVE METABOLIC PANEL
ALBUMIN: 3.7 g/dL (ref 3.5–5.2)
ALT: 20 U/L (ref 0–53)
AST: 19 U/L (ref 0–37)
Alkaline Phosphatase: 92 U/L (ref 39–117)
BUN: 13 mg/dL (ref 6–23)
CALCIUM: 9.2 mg/dL (ref 8.4–10.5)
CHLORIDE: 104 meq/L (ref 96–112)
CO2: 27 meq/L (ref 19–32)
Creatinine, Ser: 0.88 mg/dL (ref 0.50–1.35)
GFR calc Af Amer: >90 mL/min (ref >90)
Glucose, Bld: 106 mg/dL — ABNORMAL HIGH (ref 70–99)
Potassium: 4.4 mEq/L (ref 3.7–5.3)
SODIUM: 143 meq/L (ref 137–147)
Total Bilirubin: 0.4 mg/dL (ref 0.3–1.2)
Total Protein: 7 g/dL (ref 6.0–8.3)

## 2013-12-11 NOTE — Telephone Encounter (Signed)
Spoke with pt, his losartan was changed to edari by dr plotnikov. His bp has been running high since then. Last night it was 197/117, he took and extra tablet. Today it cont to run high, he has a headache and occ he will have a funny feeling in his chest. The pt is currently in the parking lot of the ER to have it checked out.

## 2013-12-11 NOTE — ED Provider Notes (Signed)
CSN: [id number]     Arrival date & time 12/11/13  1512 History   First MD Initiated Contact with Patient 12/11/13 1701     Chief Complaint  Patient presents with  . Hypertension  . Blurred Vision  . Headache     (Consider location/radiation/quality/duration/timing/severity/associated sxs/prior Treatment) Patient is a 63 y.o. male presenting with hypertension and headaches. The history is provided by the patient and the spouse.  Hypertension Associated symptoms include headaches.  Headache  patient here complaining of increased blood pressure at home x24 hours. His systolics have been running in the [id number] with diastolics in the [id number]. He notes mild headache and blurred vision but no vomiting or neck pain. He has been compliant with his antihypertensive medications. Denies any chest pain. No syncope or near-syncope. No weakness to one side of his body. Symptoms have been persistent and nothing makes them better or worse. No change in his mental status. No new treatments used prior to arrival.  Past Medical History  Diagnosis Date  . Hiatal hernia   . Duodenitis   . Renal cyst   . Bell's palsy   . Hypercholesteremia   . Diverticulosis   . Cyst     sebacceous  . Renal cell carcinoma   . Bronchitis   . Low back pain   . Hypertension   . Vitamin B12 deficiency   . PVD (peripheral vascular disease)     s/p right iliofem. bypass  . Pancreatitis   . GERD (gastroesophageal reflux disease)   . COPD (chronic obstructive pulmonary disease)   . Colon polyps   . Coronary artery disease     a. s/p INF STEMI 4/12: DES x 2 (mid and dist RCA);  b. cath 4/12: oLAD 40%, mLAD 40%, pD1 30%, pOM1 40%, pOM3 50%, mRCA 80% (PCI), PDA 70% (med tx), PLB1 60%  (med tx); EF 55%  . Meralgia paresthetica     right  . Erectile dysfunction   . Chest pain, unspecified   . OSA (obstructive sleep apnea)     PSG 03/02/11>>AHI 28.6, SpO2 low 88%, CPAP 10 cm H2O.  Marland Kitchen Myocardial infarction 4/12    2 stents  .  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 Bypass graft      Rt. iliofemoral   . Inguinal hernia repair      Rt.  . Tumor removal      Cryo Rena  . Colonoscopy    . Upper gastrointestinal endoscopy    . Video assisted thoracoscopy  July 2013    Rt upper and Rt lower lobe wedge resections  . Coronary angioplasty  01/2011  . Mass excision Right 09/19/2013    Procedure: Excision back mass x2;  Surgeon: Stark Klein, MD;  Location: [place] [facility];  Service: General;  Laterality: Right;  . Excision of skin tag N/A 09/19/2013    Procedure: EXCISION OF SKIN TAG;  Surgeon: Stark Klein, MD;  Location: [facility];  Service: General;  Laterality: N/A;   Family History  Problem Relation Age of Onset  . Colon cancer Paternal Grandmother   . Cancer Paternal Grandmother     colon  . Hypertension    . Stroke Father   . Hypertension Mother    History  Substance Use Topics  . Smoking status: Current Every Day Smoker -- 0.75 packs/day for 40 years    Types: Cigarettes  .  Smokeless tobacco: Never Used  . Alcohol Use: Yes     Comment: occasionally couple of drinks a month    Review of Systems  Neurological: Positive for headaches.  All other systems reviewed and are negative.      Allergies  Pravastatin sodium; Rosuvastatin; and Simvastatin  Home Medications   Current Outpatient Rx  Name  Route  Sig  Dispense  Refill  . aspirin 81 MG tablet   Oral   Take 81 mg by mouth daily.          . Azilsartan Medoxomil (EDARBI) 80 MG TABS   Oral   Take 1 tablet (80 mg total) by mouth daily.   90 tablet   3   . Cholecalciferol (VITAMIN D3) 1000 UNITS CAPS   Oral   Take 1 capsule by mouth daily.           . clopidogrel (PLAVIX) 75 MG tablet   Oral   Take 75 mg by mouth daily with breakfast.         . Coenzyme Q10 (CO Q-10) 200 MG CAPS   Oral   Take 200 mg by mouth daily. Hold while in hospital          . docusate sodium (COLACE) 100 MG capsule   Oral   Take 100 mg by mouth daily as needed for mild constipation.         Marland Kitchen ezetimibe (ZETIA) 10 MG tablet   Oral   Take 10 mg by mouth daily.         Marland Kitchen ibuprofen (ADVIL,MOTRIN) 200 MG tablet   Oral   Take 400 mg by mouth daily as needed for headache.         . naproxen (NAPROSYN) 500 MG tablet   Oral   Take 1 tablet (500 mg total) by mouth 2 (two) times daily with a meal.   60 tablet   0   . nitroGLYCERIN (NITROSTAT) 0.4 MG SL tablet   Sublingual   Place 1 tablet (0.4 mg total) under the tongue every 5 (five) minutes as needed for chest pain. May take up to 3 doses   25 tablet   3   . oxymetazoline (AFRIN) 0.05 % nasal spray   Each Nare   Place 1 spray into both nostrils daily as needed. For dry nose         . pantoprazole (PROTONIX) 40 MG tablet      TAKE 1 TABLET DAILY   90 tablet   1   . Pitavastatin Calcium 2 MG TABS   Oral   Take 1 tablet (2 mg total) by mouth daily.   30 tablet      . tiotropium (SPIRIVA) 18 MCG inhalation capsule   Inhalation   Place 18 mcg into inhaler and inhale daily.         . vitamin B-12 (CYANOCOBALAMIN) 1000 MCG tablet   Oral   Take 1,000 mcg by mouth daily.          . vitamin C (ASCORBIC ACID) 500 MG tablet   Oral   Take 500 mg by mouth daily.          BP 176/94  Pulse 84  Temp(Src) 98 F (36.7 C) (Oral)  Resp 17  Wt 260 lb (117.935 kg)  SpO2 98% Physical Exam  Nursing note and vitals reviewed. Constitutional: He is oriented to person, place, and time. He appears well-developed and well-nourished.  Non-toxic appearance. No distress.  HENT:  Head: Normocephalic and atraumatic.  Eyes: Conjunctivae, EOM and lids are normal. Pupils are equal, round, and reactive to light.  Neck: Normal range of motion. Neck supple. No tracheal deviation present. No mass present.  Cardiovascular: Normal rate, regular rhythm and normal heart sounds.  Exam reveals no gallop.    No murmur heard. Pulmonary/Chest: Effort normal and breath sounds normal. No stridor. No respiratory distress. He has no decreased breath sounds. He has no wheezes. He has no rhonchi. He has no rales.  Abdominal: Soft. Normal appearance and bowel sounds are normal. He exhibits no distension. There is no tenderness. There is no rebound and no CVA tenderness.  Musculoskeletal: Normal range of motion. He exhibits no edema and no tenderness.  Neurological: He is alert and oriented to person, place, and time. He has normal strength. No cranial nerve deficit or sensory deficit. GCS eye subscore is 4. GCS verbal subscore is 5. GCS motor subscore is 6.  Skin: Skin is warm and dry. No abrasion and no rash noted.  Psychiatric: He has a normal mood and affect. His speech is normal and behavior is normal.    ED Course  Procedures (including critical care time) Labs Review Labs Reviewed  CBC WITH DIFFERENTIAL - Abnormal; Notable for the following:    Hemoglobin 17.2 (*)    All other components within normal limits  COMPREHENSIVE METABOLIC PANEL - Abnormal; Notable for the following:    Glucose, Bld 106 (*)    All other components within normal limits   Imaging Review No results found.  EKG Interpretation    Date/Time:  Wednesday December 11 2013 16:59:48 EST Ventricular Rate:  69 PR Interval:  157 QRS Duration: 104 QT Interval:  418 QTC Calculation: 448 R Axis:   51 Text Interpretation:  Sinus rhythm Probable left atrial enlargement Inferolateral infarct, old No significant change since last tracing Confirmed by [place] ([id number]) on 12/11/2013 5:32:22 PM            MDM   Final diagnoses:  None    Spoke with cardiology and they will see the patient on followup    Leota Jacobsen, MD 12/11/13 1738

## 2013-12-11 NOTE — Discharge Instructions (Signed)
Your Dr. will call you tomorrow to schedule an office visit Arterial Hypertension Arterial hypertension (high blood pressure) is a condition of elevated pressure in your blood vessels. Hypertension over a long period of time is a risk factor for strokes, heart attacks, and heart failure. It is also the leading cause of kidney (renal) failure.  CAUSES   In Adults -- Over 90% of all hypertension has no known cause. This is called essential or primary hypertension. In the other 10% of people with hypertension, the increase in blood pressure is caused by another disorder. This is called secondary hypertension. Important causes of secondary hypertension are:  Heavy alcohol use.  Obstructive sleep apnea.  Hyperaldosterosim (Conn's syndrome).  Steroid use.  Chronic kidney failure.  Hyperparathyroidism.  Medications.  Renal artery stenosis.  Pheochromocytoma.  Cushing's disease.  Coarctation of the aorta.  Scleroderma renal crisis.  Licorice (in excessive amounts).  Drugs (cocaine, methamphetamine). Your caregiver can explain any items above that apply to you.  In Children -- Secondary hypertension is more common and should always be considered.  Pregnancy -- Few women of childbearing age have high blood pressure. However, up to 10% of them develop hypertension of pregnancy. Generally, this will not harm the woman. It may be a sign of 3 complications of pregnancy: preeclampsia, HELLP syndrome, and eclampsia. Follow up and control with medication is necessary. SYMPTOMS   This condition normally does not produce any noticeable symptoms. It is usually found during a routine exam.  Malignant hypertension is a late problem of high blood pressure. It may have the following symptoms:  Headaches.  Blurred vision.  End-organ damage (this means your kidneys, heart, lungs, and other organs are being damaged).  Stressful situations can increase the blood pressure. If a person with  normal blood pressure has their blood pressure go up while being seen by their caregiver, this is often termed "white coat hypertension." Its importance is not known. It may be related with eventually developing hypertension or complications of hypertension.  Hypertension is often confused with mental tension, stress, and anxiety. DIAGNOSIS  The diagnosis is made by 3 separate blood pressure measurements. They are taken at least 1 week apart from each other. If there is organ damage from hypertension, the diagnosis may be made without repeat measurements. Hypertension is usually identified by having blood pressure readings:  Above 140/90 mmHg measured in both arms, at 3 separate times, over a couple weeks.  Over 130/80 mmHg should be considered a risk factor and may require treatment in patients with diabetes. Blood pressure readings over 120/80 mmHg are called "pre-hypertension" even in non-diabetic patients. To get a true blood pressure measurement, use the following guidelines. Be aware of the factors that can alter blood pressure readings.  Take measurements at least 1 hour after caffeine.  Take measurements 30 minutes after smoking and without any stress. This is another reason to quit smoking  it raises your blood pressure.  Use a proper cuff size. Ask your caregiver if you are not sure about your cuff size.  Most home blood pressure cuffs are automatic. They will measure systolic and diastolic pressures. The systolic pressure is the pressure reading at the start of sounds. Diastolic pressure is the pressure at which the sounds disappear. If you are elderly, measure pressures in multiple postures. Try sitting, lying or standing.  Sit at rest for a minimum of 5 minutes before taking measurements.  You should not be on any medications like decongestants. These are found in  many cold medications.  Record your blood pressure readings and review them with your caregiver. If you have  hypertension:  Your caregiver may do tests to be sure you do not have secondary hypertension (see "causes" above).  Your caregiver may also look for signs of metabolic syndrome. This is also called Syndrome X or Insulin Resistance Syndrome. You may have this syndrome if you have type 2 diabetes, abdominal obesity, and abnormal blood lipids in addition to hypertension.  Your caregiver will take your medical and family history and perform a physical exam.  Diagnostic tests may include blood tests (for glucose, cholesterol, potassium, and kidney function), a urinalysis, or an EKG. Other tests may also be necessary depending on your condition. PREVENTION  There are important lifestyle issues that you can adopt to reduce your chance of developing hypertension:  Maintain a normal weight.  Limit the amount of salt (sodium) in your diet.  Exercise often.  Limit alcohol intake.  Get enough potassium in your diet. Discuss specific advice with your caregiver.  Follow a DASH diet (dietary approaches to stop hypertension). This diet is rich in fruits, vegetables, and low-fat dairy products, and avoids certain fats. PROGNOSIS  Essential hypertension cannot be cured. Lifestyle changes and medical treatment can lower blood pressure and reduce complications. The prognosis of secondary hypertension depends on the underlying cause. Many people whose hypertension is controlled with medicine or lifestyle changes can live a normal, healthy life.  RISKS AND COMPLICATIONS  While high blood pressure alone is not an illness, it often requires treatment due to its short- and long-term effects on many organs. Hypertension increases your risk for:  CVAs or strokes (cerebrovascular accident).  Heart failure due to chronically high blood pressure (hypertensive cardiomyopathy).  Heart attack (myocardial infarction).  Damage to the retina (hypertensive retinopathy).  Kidney failure (hypertensive  nephropathy). Your caregiver can explain list items above that apply to you. Treatment of hypertension can significantly reduce the risk of complications. TREATMENT   For overweight patients, weight loss and regular exercise are recommended. Physical fitness lowers blood pressure.  Mild hypertension is usually treated with diet and exercise. A diet rich in fruits and vegetables, fat-free dairy products, and foods low in fat and salt (sodium) can help lower blood pressure. Decreasing salt intake decreases blood pressure in a 1/3 of people.  Stop smoking if you are a smoker. The steps above are highly effective in reducing blood pressure. While these actions are easy to suggest, they are difficult to achieve. Most patients with moderate or severe hypertension end up requiring medications to bring their blood pressure down to a normal level. There are several classes of medications for treatment. Blood pressure pills (antihypertensives) will lower blood pressure by their different actions. Lowering the blood pressure by 10 mmHg may decrease the risk of complications by as much as 25%. The goal of treatment is effective blood pressure control. This will reduce your risk for complications. Your caregiver will help you determine the best treatment for you according to your lifestyle. What is excellent treatment for one person, may not be for you. HOME CARE INSTRUCTIONS   Do not smoke.  Follow the lifestyle changes outlined in the "Prevention" section.  If you are on medications, follow the directions carefully. Blood pressure medications must be taken as prescribed. Skipping doses reduces their benefit. It also puts you at risk for problems.  Follow up with your caregiver, as directed.  If you are asked to monitor your blood pressure at home,  follow the guidelines in the "Diagnosis" section above. SEEK MEDICAL CARE IF:   You think you are having medication side effects.  You have recurrent  headaches or lightheadedness.  You have swelling in your ankles.  You have trouble with your vision. SEEK IMMEDIATE MEDICAL CARE IF:   You have sudden onset of chest pain or pressure, difficulty breathing, or other symptoms of a heart attack.  You have a severe headache.  You have symptoms of a stroke (such as sudden weakness, difficulty speaking, difficulty walking). MAKE SURE YOU:   Understand these instructions.  Will watch your condition.  Will get help right away if you are not doing well or get worse. Document Released: 10/17/2005 Document Revised: 01/09/2012 Document Reviewed: 05/17/2007 [facility] Patient Information 2014 [place].

## 2013-12-11 NOTE — Telephone Encounter (Signed)
F/u    Pt want to know if he need to go to ER. Please call pt concerning his bp

## 2013-12-11 NOTE — ED Notes (Signed)
Pt presents to department for evaluation of hypertension, headache, and blurred vision. Pt reports BP of 180/110 at home today. Upon arrival he is conscious alert and oriented x4. Denies chest pain. Respirations unlabored.

## 2013-12-11 NOTE — ED Notes (Signed)
Pt presents to ED for evaluation of headache and hypertension.  States he had a headache all day yesterday, when he came home at night checked his BP- 094 systolic.  Pt took extra blood pressure pill last night and continued to monitor BP, states that BP has stayed elevated.  Called PCP and wasn't able to get into office, instructed to come to ED.  Pt currently complaining of head pain to left temporal area and above left eye- admits to come blurred vision.  Currently alert and oriented X 4, sinus rhythm on monitor.

## 2013-12-11 NOTE — Telephone Encounter (Signed)
New message    C/o  Blood pressure issues today 170/100 . Am meds taken . Last night  @ 6 pm 200/117 .  Took his pm meds seem to go down some .

## 2013-12-12 ENCOUNTER — Telehealth: Payer: Self-pay | Admitting: Internal Medicine

## 2013-12-12 NOTE — Telephone Encounter (Signed)
New Problem:  PT states he needs to be seen today. He was just seen in the hospital for Hypertension... I offered the pt an appt on 2/18 with Richardson Dopp.. Pt did not want to wait.. Pt wants to be seen today. PT would like a call back from the nurse. Pt states the hospital told him that he would be seen by our office today. I do not see anything mentioned in his hospital record about that. His hospital notes in epic just say to follow up with our office... There was not a specified time.

## 2013-12-12 NOTE — Telephone Encounter (Signed)
Spoke with pt, his bp is still running a little high. This am was 200/100. Pt reports he has an appointment in the morning with dr plotnikov. He will call if he has concerns.

## 2013-12-13 ENCOUNTER — Encounter: Payer: Self-pay | Admitting: Internal Medicine

## 2013-12-13 ENCOUNTER — Ambulatory Visit (INDEPENDENT_AMBULATORY_CARE_PROVIDER_SITE_OTHER): Payer: 59 | Admitting: Internal Medicine

## 2013-12-13 VITALS — BP 180/100 | HR 76 | Temp 98.9°F | Resp 16 | Wt 264.0 lb

## 2013-12-13 DIAGNOSIS — E0590 Thyrotoxicosis, unspecified without thyrotoxic crisis or storm: Secondary | ICD-10-CM

## 2013-12-13 DIAGNOSIS — F411 Generalized anxiety disorder: Secondary | ICD-10-CM

## 2013-12-13 DIAGNOSIS — I10 Essential (primary) hypertension: Secondary | ICD-10-CM

## 2013-12-13 DIAGNOSIS — R5381 Other malaise: Secondary | ICD-10-CM

## 2013-12-13 DIAGNOSIS — R5383 Other fatigue: Secondary | ICD-10-CM

## 2013-12-13 MED ORDER — METOPROLOL SUCCINATE ER 25 MG PO TB24: 50.0000 mg | ORAL_TABLET | Freq: Every day | ORAL | Status: DC

## 2013-12-13 NOTE — Progress Notes (Signed)
Pre visit review using our clinic review tool, if applicable. No additional management support is needed unless otherwise documented below in the visit note. 

## 2013-12-13 NOTE — Progress Notes (Signed)
   Subjective:     HPI  He was in the ER for high BP SBP 170-208 DBP100-110 Salty snacks, wt gain  The patient presents for a follow-up of  chronic hypertension, chronic dyslipidemia, PVD, CAD controlled with medicines. He started Tricor. He doesn't take  Lipitor. F/u ED. F/u bruising - resolved. F/u B IT pain. C/o fatigue from Metoprolol or other meds. C/o HAs  Wt Readings from Last 3 Encounters:  12/13/13 264 lb (119.75 kg)  12/11/13 260 lb (117.935 kg)  11/14/13 260 lb (117.935 kg)   BP Readings from Last 3 Encounters:  12/13/13 180/100  12/11/13 156/97  11/14/13 148/94     Review of Systems  Constitutional: Negative for appetite change, fatigue and unexpected weight change.  HENT: Negative for congestion, nosebleeds, sneezing, sore throat and trouble swallowing.   Eyes: Negative for itching and visual disturbance.  Respiratory: Positive for cough.   Cardiovascular: Negative for palpitations and leg swelling.  Gastrointestinal: Negative for nausea, diarrhea, blood in stool and abdominal distention.  Genitourinary: Negative for frequency and hematuria.  Musculoskeletal: Negative for back pain, gait problem, joint swelling and neck pain.  Skin: Negative for rash.  Neurological: Negative for dizziness, tremors, speech difficulty and weakness.  Psychiatric/Behavioral: Negative for suicidal ideas, sleep disturbance, dysphoric mood and agitation. The patient is not nervous/anxious.        Objective:   Physical Exam  Constitutional: He is oriented to person, place, and time. He appears well-developed.  Obese  HENT:  Mouth/Throat: Oropharynx is clear and moist.  Eyes: Conjunctivae are normal. Pupils are equal, round, and reactive to light.  Neck: Normal range of motion. No JVD present. No thyromegaly present.  Cardiovascular: Normal rate, regular rhythm, normal heart sounds and intact distal pulses.  Exam reveals no gallop and no friction rub.   No murmur  heard. Pulmonary/Chest: Effort normal and breath sounds normal. No respiratory distress. He has no wheezes. He has no rales. He exhibits no tenderness.  Abdominal: Soft. Bowel sounds are normal. He exhibits no distension and no mass. There is no tenderness. There is no rebound and no guarding.  Musculoskeletal: Normal range of motion. He exhibits no edema and no tenderness.  R lat foot oval ellastic mass 2x1.5 cm  Lymphadenopathy:    He has no cervical adenopathy.  Neurological: He is alert and oriented to person, place, and time. He has normal reflexes. No cranial nerve deficit. He exhibits normal muscle tone. Coordination normal.  Skin: Skin is warm and dry. No rash noted.  Psychiatric: He has a normal mood and affect. His behavior is normal. Judgment and thought content normal.  Lipomas - chest  R heel is tender  Lab Results  Component Value Date   WBC 8.7 12/11/2013   HGB 17.2* 12/11/2013   HCT 49.5 12/11/2013   PLT 187 12/11/2013   GLUCOSE 106* 12/11/2013   CHOL 167 11/15/2013   TRIG 185.0* 11/15/2013   HDL 28.20* 11/15/2013   LDLDIRECT 111.7 08/07/2013   LDLCALC 102* 11/15/2013   ALT 20 12/11/2013   AST 19 12/11/2013   NA 143 12/11/2013   K 4.4 12/11/2013   CL 104 12/11/2013   CREATININE 0.88 12/11/2013   BUN 13 12/11/2013   CO2 27 12/11/2013   TSH 2.49 08/07/2013   PSA 0.48 11/03/2010   INR 1.02 06/06/2011   HGBA1C 5.7 05/09/2012        Assessment & Plan:

## 2013-12-13 NOTE — Assessment & Plan Note (Signed)
2/15  He was in the ER for high BP SBP 170-208 DBP100-110 Salty snacks, wt gain  NAS diet

## 2013-12-13 NOTE — Assessment & Plan Note (Signed)
Doing good  

## 2013-12-13 NOTE — Patient Instructions (Signed)
Loose wight No added salt

## 2013-12-14 ENCOUNTER — Encounter: Payer: Self-pay | Admitting: Internal Medicine

## 2013-12-14 NOTE — Assessment & Plan Note (Signed)
Continue with current prescription therapy as reflected on the Med list.  

## 2013-12-14 NOTE — Assessment & Plan Note (Signed)
Continue with current prn prescription therapy as reflected on the Med list.  

## 2013-12-16 ENCOUNTER — Telehealth: Payer: Self-pay | Admitting: Internal Medicine

## 2013-12-16 NOTE — Telephone Encounter (Signed)
Relevant patient education assigned to patient using Emmi. ° °

## 2013-12-18 ENCOUNTER — Telehealth: Payer: Self-pay | Admitting: Pulmonary Disease

## 2013-12-18 NOTE — Telephone Encounter (Signed)
LM and confirmed dx code of COPD (37). Nothing further was needed.

## 2013-12-19 ENCOUNTER — Observation Stay (HOSPITAL_COMMUNITY): Payer: 59

## 2013-12-19 ENCOUNTER — Emergency Department (HOSPITAL_COMMUNITY): Payer: 59

## 2013-12-19 ENCOUNTER — Encounter (HOSPITAL_COMMUNITY): Payer: Self-pay | Admitting: Emergency Medicine

## 2013-12-19 ENCOUNTER — Encounter (INDEPENDENT_AMBULATORY_CARE_PROVIDER_SITE_OTHER): Payer: Self-pay | Admitting: General Surgery

## 2013-12-19 ENCOUNTER — Ambulatory Visit (INDEPENDENT_AMBULATORY_CARE_PROVIDER_SITE_OTHER): Payer: 59 | Admitting: General Surgery

## 2013-12-19 ENCOUNTER — Observation Stay (HOSPITAL_COMMUNITY)
Admission: EM | Admit: 2013-12-19 | Discharge: 2013-12-20 | Disposition: A | Discharge status: Home / Self Care | Payer: 59 | Attending: Internal Medicine | Admitting: Internal Medicine

## 2013-12-19 VITALS — BP 132/86 | HR 80 | Temp 97.4°F | Resp 18 | Ht 70.0 in | Wt 262.0 lb

## 2013-12-19 DIAGNOSIS — Z860100 Personal history of colon polyps, unspecified: Secondary | ICD-10-CM | POA: Insufficient documentation

## 2013-12-19 DIAGNOSIS — Z8669 Personal history of other diseases of the nervous system and sense organs: Secondary | ICD-10-CM | POA: Insufficient documentation

## 2013-12-19 DIAGNOSIS — J8482 Adult pulmonary Langerhans cell histiocytosis: Secondary | ICD-10-CM

## 2013-12-19 DIAGNOSIS — J209 Acute bronchitis, unspecified: Secondary | ICD-10-CM

## 2013-12-19 DIAGNOSIS — Z7902 Long term (current) use of antithrombotics/antiplatelets: Secondary | ICD-10-CM | POA: Insufficient documentation

## 2013-12-19 DIAGNOSIS — G4733 Obstructive sleep apnea (adult) (pediatric): Secondary | ICD-10-CM | POA: Diagnosis present

## 2013-12-19 DIAGNOSIS — I2510 Atherosclerotic heart disease of native coronary artery without angina pectoris: Secondary | ICD-10-CM | POA: Insufficient documentation

## 2013-12-19 DIAGNOSIS — J438 Other emphysema: Secondary | ICD-10-CM

## 2013-12-19 DIAGNOSIS — I10 Essential (primary) hypertension: Secondary | ICD-10-CM | POA: Diagnosis present

## 2013-12-19 DIAGNOSIS — Z85528 Personal history of other malignant neoplasm of kidney: Secondary | ICD-10-CM | POA: Insufficient documentation

## 2013-12-19 DIAGNOSIS — R059 Cough, unspecified: Secondary | ICD-10-CM

## 2013-12-19 DIAGNOSIS — Z9861 Coronary angioplasty status: Secondary | ICD-10-CM | POA: Insufficient documentation

## 2013-12-19 DIAGNOSIS — F17200 Nicotine dependence, unspecified, uncomplicated: Secondary | ICD-10-CM | POA: Insufficient documentation

## 2013-12-19 DIAGNOSIS — K219 Gastro-esophageal reflux disease without esophagitis: Secondary | ICD-10-CM

## 2013-12-19 DIAGNOSIS — R0789 Other chest pain: Principal | ICD-10-CM | POA: Insufficient documentation

## 2013-12-19 DIAGNOSIS — I6529 Occlusion and stenosis of unspecified carotid artery: Secondary | ICD-10-CM | POA: Diagnosis present

## 2013-12-19 DIAGNOSIS — Z79899 Other long term (current) drug therapy: Secondary | ICD-10-CM | POA: Insufficient documentation

## 2013-12-19 DIAGNOSIS — Z7982 Long term (current) use of aspirin: Secondary | ICD-10-CM | POA: Insufficient documentation

## 2013-12-19 DIAGNOSIS — Z8709 Personal history of other diseases of the respiratory system: Secondary | ICD-10-CM | POA: Insufficient documentation

## 2013-12-19 DIAGNOSIS — R05 Cough: Secondary | ICD-10-CM

## 2013-12-19 DIAGNOSIS — H538 Other visual disturbances: Secondary | ICD-10-CM | POA: Insufficient documentation

## 2013-12-19 DIAGNOSIS — Z888 Allergy status to other drugs, medicaments and biological substances status: Secondary | ICD-10-CM | POA: Insufficient documentation

## 2013-12-19 DIAGNOSIS — R079 Chest pain, unspecified: Secondary | ICD-10-CM | POA: Diagnosis present

## 2013-12-19 DIAGNOSIS — N281 Cyst of kidney, acquired: Secondary | ICD-10-CM

## 2013-12-19 DIAGNOSIS — I739 Peripheral vascular disease, unspecified: Secondary | ICD-10-CM | POA: Diagnosis present

## 2013-12-19 DIAGNOSIS — I252 Old myocardial infarction: Secondary | ICD-10-CM | POA: Insufficient documentation

## 2013-12-19 DIAGNOSIS — E0590 Thyrotoxicosis, unspecified without thyrotoxic crisis or storm: Secondary | ICD-10-CM

## 2013-12-19 DIAGNOSIS — J439 Emphysema, unspecified: Secondary | ICD-10-CM

## 2013-12-19 DIAGNOSIS — Z87448 Personal history of other diseases of urinary system: Secondary | ICD-10-CM | POA: Insufficient documentation

## 2013-12-19 DIAGNOSIS — L723 Sebaceous cyst: Secondary | ICD-10-CM

## 2013-12-19 HISTORY — DX: Shortness of breath: R06.02

## 2013-12-19 LAB — CBC WITH DIFFERENTIAL/PLATELET
Basophils Absolute: 0 10*3/uL (ref 0.0–0.1)
Basophils Relative: 0 % (ref 0–1)
EOS ABS: 0.3 10*3/uL (ref 0.0–0.7)
Eosinophils Relative: 2 % (ref 0–5)
HEMATOCRIT: 47.7 % (ref 39.0–52.0)
HEMOGLOBIN: 16.9 g/dL (ref 13.0–17.0)
LYMPHS ABS: 4 10*3/uL (ref 0.7–4.0)
LYMPHS PCT: 31 % (ref 12–46)
MCH: 33 pg (ref 26.0–34.0)
MCHC: 35.4 g/dL (ref 30.0–36.0)
MCV: 93.2 fL (ref 78.0–100.0)
MONO ABS: 1 10*3/uL (ref 0.1–1.0)
MONOS PCT: 8 % (ref 3–12)
Neutro Abs: 7.3 10*3/uL (ref 1.7–7.7)
Neutrophils Relative %: 58 % (ref 43–77)
PLATELETS: 210 10*3/uL (ref 150–400)
RBC: 5.12 MIL/uL (ref 4.22–5.81)
RDW: 13.9 % (ref 11.5–15.5)
WBC: 12.6 10*3/uL — AB (ref 4.0–10.5)

## 2013-12-19 LAB — COMPREHENSIVE METABOLIC PANEL
ALT: 18 U/L (ref 0–53)
AST: 17 U/L (ref 0–37)
Albumin: 3.8 g/dL (ref 3.5–5.2)
Alkaline Phosphatase: 100 U/L (ref 39–117)
BUN: 17 mg/dL (ref 6–23)
CALCIUM: 9.6 mg/dL (ref 8.4–10.5)
CO2: 28 meq/L (ref 19–32)
CREATININE: 1.03 mg/dL (ref 0.50–1.35)
Chloride: 100 mEq/L (ref 96–112)
GFR calc non Af Amer: 76 mL/min — ABNORMAL LOW (ref >90)
GFR, EST AFRICAN AMERICAN: 89 mL/min — AB (ref >90)
Glucose, Bld: 72 mg/dL (ref 70–99)
Potassium: 4.5 mEq/L (ref 3.7–5.3)
Sodium: 140 mEq/L (ref 137–147)
TOTAL PROTEIN: 7.2 g/dL (ref 6.0–8.3)
Total Bilirubin: 0.3 mg/dL (ref 0.3–1.2)

## 2013-12-19 LAB — TROPONIN I: Troponin I: <0.3 ng/mL (ref <0.30)

## 2013-12-19 LAB — D-DIMER, QUANTITATIVE: D-Dimer, Quant: 0.7 ug/mL-FEU — ABNORMAL HIGH (ref 0.00–0.48)

## 2013-12-19 LAB — PRO B NATRIURETIC PEPTIDE: Pro B Natriuretic peptide (BNP): 106.6 pg/mL (ref 0–125)

## 2013-12-19 MED ORDER — NITROGLYCERIN 0.4 MG SL SUBL
0.4000 mg | SUBLINGUAL_TABLET | Freq: Once | SUBLINGUAL | Status: AC
  Administered 2013-12-19: 0.4 mg via SUBLINGUAL

## 2013-12-19 MED ORDER — ENOXAPARIN SODIUM 40 MG/0.4ML ~~LOC~~ SOLN
40.0000 mg | Freq: Every day | SUBCUTANEOUS | Status: DC
  Administered 2013-12-19: 40 mg via SUBCUTANEOUS
  Filled 2013-12-19 (×2): qty 0.4

## 2013-12-19 MED ORDER — MORPHINE SULFATE 2 MG/ML IJ SOLN: 2.0000 mg | INTRAMUSCULAR | Status: DC | PRN

## 2013-12-19 MED ORDER — TIOTROPIUM BROMIDE MONOHYDRATE 18 MCG IN CAPS
18.0000 ug | ORAL_CAPSULE | Freq: Every day | RESPIRATORY_TRACT | Status: DC
  Filled 2013-12-19: qty 5

## 2013-12-19 MED ORDER — IPRATROPIUM-ALBUTEROL 0.5-2.5 (3) MG/3ML IN SOLN: 3.0000 mL | RESPIRATORY_TRACT | Status: DC | PRN

## 2013-12-19 MED ORDER — METOPROLOL SUCCINATE ER 50 MG PO TB24
50.0000 mg | ORAL_TABLET | Freq: Every day | ORAL | Status: DC
  Administered 2013-12-19 – 2013-12-20 (×2): 50 mg via ORAL
  Filled 2013-12-19 (×2): qty 1

## 2013-12-19 MED ORDER — EZETIMIBE 10 MG PO TABS
10.0000 mg | ORAL_TABLET | Freq: Every day | ORAL | Status: DC
  Administered 2013-12-19 – 2013-12-20 (×2): 10 mg via ORAL
  Filled 2013-12-19 (×2): qty 1

## 2013-12-19 MED ORDER — ASPIRIN EC 81 MG PO TBEC
81.0000 mg | DELAYED_RELEASE_TABLET | Freq: Every day | ORAL | Status: DC
  Administered 2013-12-20: 81 mg via ORAL
  Filled 2013-12-19: qty 1

## 2013-12-19 MED ORDER — CLOPIDOGREL BISULFATE 75 MG PO TABS
75.0000 mg | ORAL_TABLET | Freq: Every day | ORAL | Status: DC
  Administered 2013-12-20: 75 mg via ORAL
  Filled 2013-12-19: qty 1

## 2013-12-19 MED ORDER — IRBESARTAN 300 MG PO TABS
300.0000 mg | ORAL_TABLET | Freq: Every day | ORAL | Status: DC
  Administered 2013-12-20: 300 mg via ORAL
  Filled 2013-12-19: qty 1

## 2013-12-19 MED ORDER — PANTOPRAZOLE SODIUM 40 MG PO TBEC
40.0000 mg | DELAYED_RELEASE_TABLET | Freq: Every day | ORAL | Status: DC
  Administered 2013-12-19 – 2013-12-20 (×2): 40 mg via ORAL
  Filled 2013-12-19 (×2): qty 1

## 2013-12-19 MED ORDER — ASPIRIN 81 MG PO CHEW
324.0000 mg | CHEWABLE_TABLET | Freq: Once | ORAL | Status: AC
  Administered 2013-12-19: 324 mg via ORAL
  Filled 2013-12-19: qty 4

## 2013-12-19 MED ORDER — NITROGLYCERIN 2 % TD OINT
0.5000 [in_us] | TOPICAL_OINTMENT | Freq: Four times a day (QID) | TRANSDERMAL | Status: DC
  Administered 2013-12-19 – 2013-12-20 (×2): 0.5 [in_us] via TOPICAL
  Filled 2013-12-19: qty 1
  Filled 2013-12-19: qty 30

## 2013-12-19 MED ORDER — NITROGLYCERIN 0.4 MG SL SUBL: 0.4000 mg | SUBLINGUAL_TABLET | SUBLINGUAL | Status: DC | PRN

## 2013-12-19 NOTE — ED Notes (Signed)
Contacted CT regarding delay. Contacted Pharmacy to reschedule 81mg  ASA. Pt updated.

## 2013-12-19 NOTE — ED Notes (Signed)
Pt reports he is very hungry.

## 2013-12-19 NOTE — Progress Notes (Signed)
HISTORY: Patient is a 63 year old male who underwent excision of 2 epidermal inclusion cyst on his back and 2 perianal skin tags.  He did not get his pain medication filled. This was performed back in November. He has continued to periodically have some clear drainage from the lower back wound. He denies any fevers or chills. He denies any redness. The drainage has not had any odor. It has occasionally looked bloody.    EXAM: General:  Alert and oriented x3 no distress Incision:  Upper back incision is well-healed. There is a small but at the location where the cyst used to be. The mid right back area has a punctate spot laterally that is open. There is clear expressible drainage. There is no evidence of seroma or hematoma. Silver nitrate is applied to this area.   PATHOLOGY: Diagnosis 1. Soft tissue mass, simple excision, Right mid back - EPIDERMAL INCLUSION CYST (FOLLICULAR CYST). - NO DYSPLASIA, ATYPIA OR MALIGNANCY IDENTIFIED. 2. Soft tissue mass, simple excision, Right upper back - EPIDERMAL INCLUSION CYST (FOLLICULAR CYST). - NO DYSPLASIA, ATYPIA OR MALIGNANCY IDENTIFIED. 3. Skin , Right anterior anus tag - FIBROEPITHELIAL POLYP (SKIN TAG). - NO DYSPLASIA OR MALIGNANCY IDENTIFIED. 4. Skin , Right posterior anus tag - FIBROEPITHELIAL POLYP (SKIN TAG). - NO DYSPLASIA OR MALIGNANCY IDENTIFIED.   ASSESSMENT AND PLAN:   Sebaceous cyst of right upper back Lower more lateral incision still having some clear drainage.  Silver nitrate applied.    Follow up in 2 months if still having issues.        Milus Height, MD Surgical Oncology, [facility]  Walker Kehr, MD Plotnikov, Evie Lacks, MD

## 2013-12-19 NOTE — ED Provider Notes (Signed)
Patient seen/examined in the Emergency Department in conjunction with Resident Physician Provider Justin Mend Patient reports chest pain Exam : awake/alert, maex4, no murmurs noted on CV, well appearing, eating crackers Plan: admit for chest pain r/o MI in patient with known CAD   Sharyon Cable, MD 12/19/13 [phone]

## 2013-12-19 NOTE — Patient Instructions (Signed)
Anticipate some discolored drainage in next few days due to silver nitrate.  Follow up in 2 months if still having issues.

## 2013-12-19 NOTE — ED Notes (Signed)
Pt st's he woke up this am with blurred vision.  St's approx 11:00 he developed mid chest pain, describes as sharpe.  Pt denies any nausea, vomiting or diaphoresis.  Also denies shortness of breath.

## 2013-12-19 NOTE — ED Provider Notes (Signed)
CSN: [id number]     Arrival date & time 12/19/13  1526 History   First MD Initiated Contact with Patient 12/19/13 1644     Chief Complaint  Patient presents with  . Chest Pain   HPI Comments: 63 yo M hx of CAD s/p PCI 2012, HTN, HLD, PVD, COPD, GERD, presents with CC of chest pain.  Pt states pain began around 10-11 PM while driving.  He c/o of left chest pain, sharp, nonradiating, constant.  This pain was unrelieved by nitroglycerin taken at 1-2 PM.  Pt states pain is different than other CP he has had in past.  He also c/o of transient blurry vision this morning in bilateral eyes, which has now resolved.  He denies fever, chills, SOB, cough, abdominal pain, nausea, vomiting, diarrhea, myalgias, rash, or any other symptoms.  Pt has been stable on prescription medications, and took two baby ASA today.  No other complaints.   The history is provided by the patient. No language interpreter was used.    Past Medical History  Diagnosis Date  . Hiatal hernia   . Duodenitis   . Renal cyst   . Bell's palsy   . Hypercholesteremia   . Diverticulosis   . Cyst     sebacceous  . Renal cell carcinoma   . Bronchitis   . Low back pain   . Hypertension   . Vitamin B12 deficiency   . PVD (peripheral vascular disease)     s/p right iliofem. bypass  . Pancreatitis   . GERD (gastroesophageal reflux disease)   . COPD (chronic obstructive pulmonary disease)   . Colon polyps   . Coronary artery disease     a. s/p INF STEMI 4/12: DES x 2 (mid and dist RCA);  b. cath 4/12: oLAD 40%, mLAD 40%, pD1 30%, pOM1 40%, pOM3 50%, mRCA 80% (PCI), PDA 70% (med tx), PLB1 60%  (med tx); EF 55%  . Meralgia paresthetica     right  . Erectile dysfunction   . Chest pain, unspecified   . OSA (obstructive sleep apnea)     PSG 03/02/11>>AHI 28.6, SpO2 low 88%, CPAP 10 cm H2O.  Marland Kitchen Myocardial infarction 4/12    2 stents  . 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 Bypass graft      Rt. iliofemoral   . Inguinal hernia repair      Rt.  . Tumor removal      Cryo Rena  . Colonoscopy    . Upper gastrointestinal endoscopy    . Video assisted thoracoscopy  July 2013    Rt upper and Rt lower lobe wedge resections  . Coronary angioplasty  01/2011  . Mass excision Right 09/19/2013    Procedure: Excision back mass x2;  Surgeon: Stark Klein, MD;  Location: [facility];  Service: General;  Laterality: Right;  . Excision of skin tag N/A 09/19/2013    Procedure: EXCISION OF SKIN TAG;  Surgeon: Stark Klein, MD;  Location: [facility];  Service: General;  Laterality: N/A;   Family History  Problem Relation Age of Onset  . Colon cancer Paternal Grandmother   . Cancer Paternal Grandmother     colon  . Hypertension    . Stroke Father   . Hypertension Mother    History  Substance Use Topics  . Smoking status: Current Every Day Smoker -- 0.75 packs/day for  40 years    Types: Cigarettes  . Smokeless tobacco: Never Used  . Alcohol Use: Yes     Comment: occasionally couple of drinks a month    Review of Systems  Constitutional: Negative for fever and chills.  Eyes: Positive for visual disturbance.  Respiratory: Negative for cough and shortness of breath.   Cardiovascular: Positive for chest pain. Negative for palpitations and leg swelling.  Gastrointestinal: Negative for nausea, vomiting, abdominal pain, diarrhea and constipation.  Musculoskeletal: Negative for myalgias.  Skin: Negative for rash.  Neurological: Negative for dizziness, weakness, light-headedness, numbness and headaches.  Hematological: Negative for adenopathy. Does not bruise/bleed easily.  All other systems reviewed and are negative.      Allergies  Pravastatin sodium; Rosuvastatin; and Simvastatin  Home Medications   Current Outpatient Rx  Name  Route  Sig  Dispense  Refill  . aspirin 81 MG tablet   Oral   Take 81 mg by mouth daily.           . Azilsartan Medoxomil (EDARBI) 80 MG TABS   Oral   Take 1 tablet (80 mg total) by mouth daily.   90 tablet   3   . Cholecalciferol (VITAMIN D3) 1000 UNITS CAPS   Oral   Take 1,000 Units by mouth daily.          . clopidogrel (PLAVIX) 75 MG tablet   Oral   Take 75 mg by mouth daily with breakfast.         . Coenzyme Q10 (CO Q-10) 200 MG CAPS   Oral   Take 200 mg by mouth daily.          Marland Kitchen ezetimibe (ZETIA) 10 MG tablet   Oral   Take 10 mg by mouth daily.         Marland Kitchen ibuprofen (ADVIL,MOTRIN) 200 MG tablet   Oral   Take 400 mg by mouth every 4 (four) hours as needed.         . metoprolol succinate (TOPROL-XL) 25 MG 24 hr tablet   Oral   Take 2 tablets (50 mg total) by mouth daily. Take at night   180 tablet   3   . naproxen (NAPROSYN) 500 MG tablet   Oral   Take 500 mg by mouth daily as needed for mild pain.         . nitroGLYCERIN (NITROSTAT) 0.4 MG SL tablet   Sublingual   Place 1 tablet (0.4 mg total) under the tongue every 5 (five) minutes as needed for chest pain. May take up to 3 doses   25 tablet   3   . pantoprazole (PROTONIX) 40 MG tablet   Oral   Take 40 mg by mouth daily.         Marland Kitchen tiotropium (SPIRIVA) 18 MCG inhalation capsule   Inhalation   Place 18 mcg into inhaler and inhale daily.         . vitamin B-12 (CYANOCOBALAMIN) 1000 MCG tablet   Oral   Take 1,000 mcg by mouth daily.          . vitamin C (ASCORBIC ACID) 500 MG tablet   Oral   Take 1,000 mg by mouth daily.           BP 120/65  Pulse 72  Temp(Src) 97.4 F (36.3 C) (Oral)  Resp 20  Ht 5\' 10"  (1.778 m)  Wt 263 lb (119.296 kg)  BMI 37.74 kg/m2  SpO2 95% Physical Exam  Nursing note  and vitals reviewed. Constitutional: He is oriented to person, place, and time. He appears well-developed and well-nourished.  HENT:  Head: Normocephalic and atraumatic.  Right Ear: External ear normal.  Left Ear: External ear normal.  Nose: Nose normal.  Mouth/Throat:  Oropharynx is clear and moist.  Eyes: Conjunctivae and EOM are normal. Pupils are equal, round, and reactive to light.  Neck: Normal range of motion. Neck supple.  Cardiovascular: Normal rate, regular rhythm, normal heart sounds and intact distal pulses.   Pulmonary/Chest: Effort normal and breath sounds normal. No respiratory distress. He has no wheezes. He has no rales. He exhibits no tenderness.  Abdominal: Soft. Bowel sounds are normal. He exhibits no distension and no mass. There is no tenderness. There is no rebound and no guarding.  Musculoskeletal: Normal range of motion.  Neurological: He is alert and oriented to person, place, and time.  Skin: Skin is warm and dry.    ED Course  Procedures (including critical care time) Labs Review Labs Reviewed  CBC WITH DIFFERENTIAL - Abnormal; Notable for the following:    WBC 12.6 (*)    All other components within normal limits  COMPREHENSIVE METABOLIC PANEL - Abnormal; Notable for the following:    GFR calc non Af Amer 76 (*)    GFR calc Af Amer 89 (*)    All other components within normal limits  D-DIMER, QUANTITATIVE - Abnormal; Notable for the following:    D-Dimer, Quant 0.70 (*)    All other components within normal limits  TROPONIN I  PRO B NATRIURETIC PEPTIDE  TROPONIN I  TROPONIN I  CBC WITH DIFFERENTIAL  COMPREHENSIVE METABOLIC PANEL  PROTIME-INR  LIPID PANEL  TSH  HEMOGLOBIN A1C   Imaging Review Dg Chest 2 View  12/19/2013   CLINICAL DATA:  Chest pain  EXAM: CHEST  2 VIEW  COMPARISON:  09/13/2013  FINDINGS: Cardiomediastinal silhouette is stable. No acute infiltrate or pleural effusion. No pulmonary edema. Again noted chronic mild interstitial prominence. Stable postsurgical changes in right upper lobe. Bony thorax is unremarkable.  IMPRESSION: No active cardiopulmonary disease.   Electronically Signed   By: Lahoma Crocker M.D.   On: 12/19/2013 16:10   Ct Head Wo Contrast  12/19/2013   CLINICAL DATA:  63 year old male  with blurred vision. Initial encounter.  EXAM: CT HEAD WITHOUT CONTRAST  TECHNIQUE: Contiguous axial images were obtained from the base of the skull through the vertex without intravenous contrast.  COMPARISON:  03/09/2004.  FINDINGS: Partially visible right maxillary sinus mucous retention cyst. Other Visualized paranasal sinuses and mastoids are clear. No acute osseous abnormality identified.  Postoperative changes to the right globe. No acute orbit soft tissue findings identified. Negative scalp soft tissues.  Calcified atherosclerosis at the skull base. Cerebral volume is normal. No suspicious intracranial vascular hyperdensity. No midline shift, ventriculomegaly, mass effect, evidence of mass lesion, intracranial hemorrhage or evidence of cortically based acute infarction. Gray-white matter differentiation is within normal limits throughout the brain.  IMPRESSION: Stable and normal noncontrast CT appearance of the brain.   Electronically Signed   By: Lars Pinks M.D.   On: 12/19/2013 22:19    EKG Interpretation   None       MDM   Final diagnoses:  None   63 yo M hx of CAD s/p PCI 2012, HTN, HLD, PVD, COPD, GERD, presents with CC of chest pain.   Filed Vitals:   12/19/13 2117  BP: 129/69  Pulse: 74  Temp: 97.4 F (36.3 C)  Resp:    Physical exam as above.  Pt c/o of left chest pain, 3/10 currently.  EKG NSR, T wave inversions Lead II, III, HR 87, PR 146, QRS 98, QT/QTc 372/447.    Pt given ASA, nitro SL X 1.  CXR unremarkable.  CBC mild leukocytosis 12.6, normal Hgb.  CMP WNl.  Troponin < 0.30.  Concern for ACS.  Unlikely PE, no hypoxia, no tachycardia, no unilateral leg swelling, no recent surgery or prolonged immobilization.  Unlikely aortic dissection as no back or abdominal pain, pt normotensive, with equal distal pulses in all extremities.    Pt to be admitted to medicine service for further workup.  Pt understands and agrees with plan.  Pt's care plan discussed with Dr.  Christy Gentles.  Sinda Du, MD   Sinda Du, MD 12/20/13 0111

## 2013-12-19 NOTE — Assessment & Plan Note (Addendum)
Lower more lateral incision still having some clear drainage.  Silver nitrate applied.    Follow up in 2 months if still having issues.

## 2013-12-19 NOTE — H&P (Signed)
[facility] History and Physical  Patient: [name]  FIE:[id number]  DOB: 05/22/1951  DOS: the patient was seen and examined on 12/19/2013 PCP: Walker Kehr, MD  Chief Complaint: Chest pain  HPI: [name] is a 63 y.o. male with Past medical history of early artery disease status post PCI 2012, COPD, active smoker, but if her was her disease, GERD, dyslipidemia, hypertension, Langerhans histiocytosis. The patient is coming from home. The patient presented with complaints of chest pain that started earlier this morning around 10:00. The pain was felt like sharp stabbing pain in not associated with fever, chills, shortness of breath, cough. He initially felt this pain was indigestion and try some TUMS then tried some ibuprofen and that did not work his pain and that he try some nitroglycerin which improved the pain little and therefore came to the hospital. The pain started at rest. He denies any similar pain in the past, recently he has been having on and off episodes of cold. He denies any recent travel or surgery or immobilization. He does of some swelling of his legs. he is compliant with his medications. He continues to smoke. Along with this pain he also complained of some blurring of the vision without any focal deficit is present since morning. He mentions he had similar episodes in the past which resolved on its own. And he mentions his symptoms are improving.  Review of Systems: as mentioned in the history of present illness.  A Comprehensive review of the other systems is negative.  Past Medical History  Diagnosis Date  . Hiatal hernia   . Duodenitis   . Renal cyst   . Bell's palsy   . Hypercholesteremia   . Diverticulosis   . Cyst     sebacceous  . Renal cell carcinoma   . Bronchitis   . Low back pain   . Hypertension   . Vitamin B12 deficiency   . PVD (peripheral vascular disease)     s/p right iliofem. bypass  . Pancreatitis   . GERD  (gastroesophageal reflux disease)   . COPD (chronic obstructive pulmonary disease)   . Colon polyps   . Coronary artery disease     a. s/p INF STEMI 4/12: DES x 2 (mid and dist RCA);  b. cath 4/12: oLAD 40%, mLAD 40%, pD1 30%, pOM1 40%, pOM3 50%, mRCA 80% (PCI), PDA 70% (med tx), PLB1 60%  (med tx); EF 55%  . Meralgia paresthetica     right  . Erectile dysfunction   . Chest pain, unspecified   . OSA (obstructive sleep apnea)     PSG 03/02/11>>AHI 28.6, SpO2 low 88%, CPAP 10 cm H2O.  Marland Kitchen Myocardial infarction 4/12    2 stents  . 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 Bypass graft      Rt. iliofemoral   . Inguinal hernia repair      Rt.  . Tumor removal      Cryo Rena  . Colonoscopy    . Upper gastrointestinal endoscopy    . Video assisted thoracoscopy  July 2013    Rt upper and Rt lower lobe wedge resections  . Coronary angioplasty  01/2011  . Mass excision Right 09/19/2013    Procedure: Excision back mass x2;  Surgeon: Stark Klein, MD;  Location: [facility];  Service: General;  Laterality: Right;  . Excision of skin  tag N/A 09/19/2013    Procedure: EXCISION OF SKIN TAG;  Surgeon: Stark Klein, MD;  Location: [place];  Service: General;  Laterality: N/A;   Social History:  reports that he has been smoking Cigarettes.  He has a 30 pack-year smoking history. He has never used smokeless tobacco. He reports that he drinks alcohol. He reports that he does not use illicit drugs. Independent for most of his  ADL.  Allergies  Allergen Reactions  . Pravastatin Sodium     REACTION: gasy, constipated  . Rosuvastatin     REACTION: myalgias  . Simvastatin     REACTION: myalgia    Family History  Problem Relation Age of Onset  . Colon cancer Paternal Grandmother   . Cancer Paternal Grandmother     colon  . Hypertension    . Stroke Father   . Hypertension Mother     Prior to Admission  medications   Medication Sig Start Date End Date Taking? Authorizing Provider  aspirin 81 MG tablet Take 81 mg by mouth daily.    Yes Historical Provider, MD  Azilsartan Medoxomil (EDARBI) 80 MG TABS Take 1 tablet (80 mg total) by mouth daily. 09/17/13  Yes Cassandria Anger, MD  Cholecalciferol (VITAMIN D3) 1000 UNITS CAPS Take 1,000 Units by mouth daily.    Yes Historical Provider, MD  clopidogrel (PLAVIX) 75 MG tablet Take 75 mg by mouth daily with breakfast.   Yes Historical Provider, MD  Coenzyme Q10 (CO Q-10) 200 MG CAPS Take 200 mg by mouth daily.    Yes Historical Provider, MD  ezetimibe (ZETIA) 10 MG tablet Take 10 mg by mouth daily. 02/08/13  Yes Fay Records, MD  ibuprofen (ADVIL,MOTRIN) 200 MG tablet Take 400 mg by mouth every 4 (four) hours as needed.   Yes Historical Provider, MD  metoprolol succinate (TOPROL-XL) 25 MG 24 hr tablet Take 2 tablets (50 mg total) by mouth daily. Take at night 12/13/13  Yes Evie Lacks Plotnikov, MD  naproxen (NAPROSYN) 500 MG tablet Take 500 mg by mouth daily as needed for mild pain.   Yes Historical Provider, MD  nitroGLYCERIN (NITROSTAT) 0.4 MG SL tablet Place 1 tablet (0.4 mg total) under the tongue every 5 (five) minutes as needed for chest pain. May take up to 3 doses 11/05/13  Yes Fay Records, MD  pantoprazole (PROTONIX) 40 MG tablet Take 40 mg by mouth daily.   Yes Historical Provider, MD  tiotropium (SPIRIVA) 18 MCG inhalation capsule Place 18 mcg into inhaler and inhale daily.   Yes Historical Provider, MD  vitamin B-12 (CYANOCOBALAMIN) 1000 MCG tablet Take 1,000 mcg by mouth daily.    Yes Historical Provider, MD  vitamin C (ASCORBIC ACID) 500 MG tablet Take 1,000 mg by mouth daily.    Yes Historical Provider, MD    Physical Exam: Filed Vitals:   12/19/13 1829 12/19/13 1900 12/19/13 1930 12/19/13 2024  BP: 111/62 126/52 131/79 131/79  Pulse:  64 81 72  Temp:      TempSrc:      Resp: 18 16 23 21   Height:      Weight:      SpO2: 94% 94% 93%      General: Alert, Awake and Oriented to Time, Place and Person. Appear in mild distress Eyes: PERRL ENT: Oral Mucosa clear moist. Neck: No JVD Cardiovascular: S1 and S2 Present, aortic systolic Murmur, Peripheral Pulses Present Respiratory: Bilateral Air entry equal and Decreased, no Crackles, bilateral expiratory wheezes Abdomen:  Bowel Sound Present, Soft and no Non tender Skin: no Rash Extremities: Bilateral Pedal edema, no calf tenderness Neurologic: Grossly Unremarkable. Labs on Admission:  CBC:  Recent Labs Lab 12/19/13 1537  WBC 12.6*  NEUTROABS 7.3  HGB 16.9  HCT 47.7  MCV 93.2  PLT 210    CMP     Component Value Date/Time   NA 140 12/19/2013 1537   K 4.5 12/19/2013 1537   CL 100 12/19/2013 1537   CO2 28 12/19/2013 1537   GLUCOSE 72 12/19/2013 1537   BUN 17 12/19/2013 1537   CREATININE 1.03 12/19/2013 1537   CALCIUM 9.6 12/19/2013 1537   PROT 7.2 12/19/2013 1537   ALBUMIN 3.8 12/19/2013 1537   AST 17 12/19/2013 1537   ALT 18 12/19/2013 1537   ALKPHOS 100 12/19/2013 1537   BILITOT 0.3 12/19/2013 1537   GFRNONAA 76* 12/19/2013 1537   GFRAA 89* 12/19/2013 1537    No results found for this basename: LIPASE, AMYLASE,  in the last 168 hours No results found for this basename: AMMONIA,  in the last 168 hours   Recent Labs Lab 12/19/13 1537  TROPONINI <0.30   BNP (last 3 results) No results found for this basename: PROBNP,  in the last 8760 hours  Radiological Exams on Admission: Dg Chest 2 View  12/19/2013   CLINICAL DATA:  Chest pain  EXAM: CHEST  2 VIEW  COMPARISON:  09/13/2013  FINDINGS: Cardiomediastinal silhouette is stable. No acute infiltrate or pleural effusion. No pulmonary edema. Again noted chronic mild interstitial prominence. Stable postsurgical changes in right upper lobe. Bony thorax is unremarkable.  IMPRESSION: No active cardiopulmonary disease.   Electronically Signed   By: Lahoma Crocker M.D.   On: 12/19/2013 16:10    EKG: Independently reviewed.  normal EKG, normal sinus rhythm, nonspecific ST and T waves changes.  Assessment/Plan Principal Problem:   Chest pain Active Problems:   HYPERTENSION   CAROTID ARTERY DISEASE   PERIPHERAL VASCULAR DISEASE   Adult pulmonary Langerhans cell histiocytosis   OSA (obstructive sleep apnea)   1. Chest pain The patient is presenting with complaints of chest pain. His EKG is showing inferior T wave inversions which are present in his EKG in past other than that he does not have any acute changes. Initial troponin is negative. Is currently chest pain-free. With this he will be admitted for observation, serial telemetry, echocardiogram in the morning, possible stress test in the morning. We'll check d-dimer if positive will get duplex of the legs.  2. Sleep apnea Continue CPAP  3. Active smoker, COPD, Langerhans histiocytosis DuoNeb as needed, continue inhalers,  4. Coronary artery disease and peripheral vascular disease Continue aspirin and Plavix  DVT Prophylaxis: subcutaneous HeparinNutrition: N.p.o.  Code Status: Full  Family Communication: Wife was present at bedside, opportunity was given to ask question and all questions were answered satisfactorily at the time of interview. Disposition: Admitted to observation in telemetry unit.  Author: Berle Mull, MD [facility] Pager: [phone] 12/19/2013, 8:38 PM    If 7PM-7AM, please contact night-coverage [email or web address] Password [email or web address]

## 2013-12-19 NOTE — ED Notes (Signed)
Updated Gerald Stabs RN on 3W regarding patient.

## 2013-12-20 ENCOUNTER — Observation Stay (HOSPITAL_COMMUNITY): Payer: 59

## 2013-12-20 ENCOUNTER — Encounter (HOSPITAL_COMMUNITY): Payer: Self-pay | Admitting: Cardiology

## 2013-12-20 DIAGNOSIS — F17200 Nicotine dependence, unspecified, uncomplicated: Secondary | ICD-10-CM

## 2013-12-20 DIAGNOSIS — I2510 Atherosclerotic heart disease of native coronary artery without angina pectoris: Secondary | ICD-10-CM

## 2013-12-20 DIAGNOSIS — E0590 Thyrotoxicosis, unspecified without thyrotoxic crisis or storm: Secondary | ICD-10-CM

## 2013-12-20 DIAGNOSIS — N281 Cyst of kidney, acquired: Secondary | ICD-10-CM

## 2013-12-20 DIAGNOSIS — J209 Acute bronchitis, unspecified: Secondary | ICD-10-CM

## 2013-12-20 LAB — CBC WITH DIFFERENTIAL/PLATELET
BASOS ABS: 0.1 10*3/uL (ref 0.0–0.1)
BASOS PCT: 1 % (ref 0–1)
Eosinophils Absolute: 0.3 10*3/uL (ref 0.0–0.7)
Eosinophils Relative: 3 % (ref 0–5)
HCT: 45.7 % (ref 39.0–52.0)
Hemoglobin: 15.9 g/dL (ref 13.0–17.0)
Lymphocytes Relative: 44 % (ref 12–46)
Lymphs Abs: 4 10*3/uL (ref 0.7–4.0)
MCH: 32.2 pg (ref 26.0–34.0)
MCHC: 34.8 g/dL (ref 30.0–36.0)
MCV: 92.5 fL (ref 78.0–100.0)
Monocytes Absolute: 0.8 10*3/uL (ref 0.1–1.0)
Monocytes Relative: 8 % (ref 3–12)
Neutro Abs: 4 10*3/uL (ref 1.7–7.7)
Neutrophils Relative %: 44 % (ref 43–77)
Platelets: 194 10*3/uL (ref 150–400)
RBC: 4.94 MIL/uL (ref 4.22–5.81)
RDW: 14 % (ref 11.5–15.5)
WBC: 9.1 10*3/uL (ref 4.0–10.5)

## 2013-12-20 LAB — COMPREHENSIVE METABOLIC PANEL
ALBUMIN: 3.3 g/dL — AB (ref 3.5–5.2)
ALT: 16 U/L (ref 0–53)
AST: 15 U/L (ref 0–37)
Alkaline Phosphatase: 89 U/L (ref 39–117)
BILIRUBIN TOTAL: 0.2 mg/dL — AB (ref 0.3–1.2)
BUN: 20 mg/dL (ref 6–23)
CALCIUM: 9 mg/dL (ref 8.4–10.5)
CHLORIDE: 105 meq/L (ref 96–112)
CO2: 25 mEq/L (ref 19–32)
CREATININE: 0.93 mg/dL (ref 0.50–1.35)
GFR calc Af Amer: >90 mL/min (ref >90)
GFR, EST NON AFRICAN AMERICAN: 89 mL/min — AB (ref >90)
Glucose, Bld: 141 mg/dL — ABNORMAL HIGH (ref 70–99)
Potassium: 3.8 mEq/L (ref 3.7–5.3)
SODIUM: 143 meq/L (ref 137–147)
Total Protein: 6.4 g/dL (ref 6.0–8.3)

## 2013-12-20 LAB — TSH: TSH: 2.664 u[IU]/mL (ref 0.350–4.500)

## 2013-12-20 LAB — LIPID PANEL
CHOL/HDL RATIO: 9.2 ratio
CHOLESTEROL: 156 mg/dL (ref 0–200)
HDL: 17 mg/dL — AB (ref >39)
LDL Cholesterol: 68 mg/dL (ref 0–99)
Triglycerides: 354 mg/dL — ABNORMAL HIGH (ref <150)
VLDL: 71 mg/dL — AB (ref 0–40)

## 2013-12-20 LAB — PROTIME-INR
INR: 0.97 (ref 0.00–1.49)
Prothrombin Time: 12.7 seconds (ref 11.6–15.2)

## 2013-12-20 LAB — TROPONIN I: Troponin I: <0.3 ng/mL (ref <0.30)

## 2013-12-20 LAB — HEMOGLOBIN A1C
HEMOGLOBIN A1C: 5.5 % (ref <5.7)
Mean Plasma Glucose: 111 mg/dL (ref <117)

## 2013-12-20 MED ORDER — IOHEXOL 350 MG/ML SOLN
100.0000 mL | Freq: Once | INTRAVENOUS | Status: AC | PRN
  Administered 2013-12-20: 100 mL via INTRAVENOUS

## 2013-12-20 NOTE — Consult Note (Signed)
HPI: 63 year old male for evaluation of chest pain. Patient does have a history of coronary artery disease. He has had a prior inferior infarct in April of 2012. The patient had PCI of his right coronary artery with DES x 2. He had nonobstructive disease in the LAD and circumflex. He had a 70% PDA. His ejection fraction was 55%. Patient has some dyspnea on exertion which she attributes to previous lung resection. He denies orthopnea, PND, pedal edema, exertional chest pain or syncope. Yesterday at approximately 9 AM he developed substernal chest pain. It radiated to his left breast. It was described as a stabbing pain. No associated symptoms. It was constant for 8-10 hours. It was not pleuritic or positional. It resolved upon arrival here. No further chest pain since. Cardiology asked to evaluate.  Medications Prior to Admission  Medication Sig Dispense Refill  . aspirin 81 MG tablet Take 81 mg by mouth daily.       . Azilsartan Medoxomil (EDARBI) 80 MG TABS Take 1 tablet (80 mg total) by mouth daily.  90 tablet  3  . Cholecalciferol (VITAMIN D3) 1000 UNITS CAPS Take 1,000 Units by mouth daily.       . clopidogrel (PLAVIX) 75 MG tablet Take 75 mg by mouth daily with breakfast.      . Coenzyme Q10 (CO Q-10) 200 MG CAPS Take 200 mg by mouth daily.       Marland Kitchen ezetimibe (ZETIA) 10 MG tablet Take 10 mg by mouth daily.      Marland Kitchen ibuprofen (ADVIL,MOTRIN) 200 MG tablet Take 400 mg by mouth every 4 (four) hours as needed.      . metoprolol succinate (TOPROL-XL) 25 MG 24 hr tablet Take 2 tablets (50 mg total) by mouth daily. Take at night  180 tablet  3  . naproxen (NAPROSYN) 500 MG tablet Take 500 mg by mouth daily as needed for mild pain.      . nitroGLYCERIN (NITROSTAT) 0.4 MG SL tablet Place 1 tablet (0.4 mg total) under the tongue every 5 (five) minutes as needed for chest pain. May take up to 3 doses  25 tablet  3  . pantoprazole (PROTONIX) 40 MG tablet Take 40 mg by mouth daily.      Marland Kitchen tiotropium  (SPIRIVA) 18 MCG inhalation capsule Place 18 mcg into inhaler and inhale daily.      . vitamin B-12 (CYANOCOBALAMIN) 1000 MCG tablet Take 1,000 mcg by mouth daily.       . vitamin C (ASCORBIC ACID) 500 MG tablet Take 1,000 mg by mouth daily.         Allergies  Allergen Reactions  . Pravastatin Sodium     REACTION: gasy, constipated  . Rosuvastatin     REACTION: myalgias  . Simvastatin     REACTION: myalgia    Past Medical History  Diagnosis Date  . Hiatal hernia   . Duodenitis   . Renal cyst   . Bell's palsy   . Hypercholesteremia   . Diverticulosis   . Cyst     sebacceous  . Renal cell carcinoma   . Low back pain   . Hypertension   . Vitamin B12 deficiency   . PVD (peripheral vascular disease)     s/p right iliofem. bypass  . Pancreatitis   . GERD (gastroesophageal reflux disease)   . COPD (chronic obstructive pulmonary disease)   . Colon polyps   . Coronary artery disease     a. s/p INF  STEMI 4/12: DES x 2 (mid and dist RCA);  b. cath 4/12: oLAD 40%, mLAD 40%, pD1 30%, pOM1 40%, pOM3 50%, mRCA 80% (PCI), PDA 70% (med tx), PLB1 60%  (med tx); EF 55%  . Meralgia paresthetica     right  . Erectile dysfunction   . OSA (obstructive sleep apnea)     PSG 03/02/11>>AHI 28.6, SpO2 low 88%, CPAP 10 cm H2O.  . 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 Bypass graft      Rt. iliofemoral   . Inguinal hernia repair      Rt.  . Tumor removal      Cryo Rena  . Colonoscopy    . Upper gastrointestinal endoscopy    . Video assisted thoracoscopy  July 2013    Rt upper and Rt lower lobe wedge resections  . Coronary angioplasty  01/2011  . Mass excision Right 09/19/2013    Procedure: Excision back mass x2;  Surgeon: Stark Klein, MD;  Location: [place];  Service: General;  Laterality: Right;  . Excision of skin tag N/A 09/19/2013    Procedure: EXCISION OF SKIN TAG;  Surgeon: Stark Klein,  MD;  Location: [place];  Service: General;  Laterality: N/A;    History   Social History  . Marital Status: Divorced    Spouse Name: N/A    Number of Children: N/A  . Years of Education: N/A   Occupational History  . business Freight forwarder     [employer]   Social History Main Topics  . Smoking status: Current Every Day Smoker -- 0.75 packs/day for 40 years    Types: Cigarettes  . Smokeless tobacco: Never Used  . Alcohol Use: Yes     Comment: occasionally couple of drinks a month  . Drug Use: No  . Sexual Activity: Yes   Other Topics Concern  . Not on file   Social History Narrative  . No narrative on file    Family History  Problem Relation Age of Onset  . Colon cancer Paternal Grandmother   . Cancer Paternal Grandmother     colon  . Hypertension    . Stroke Father   . Hypertension Mother     ROS:  Some headache and blurred vision but no fevers or chills, productive cough, hemoptysis, dysphasia, odynophagia, melena, hematochezia, dysuria, hematuria, rash, seizure activity, orthopnea, PND, pedal edema, claudication. Remaining systems are negative.  Physical Exam:   Blood pressure 113/55, pulse 66, temperature 98.2 F (36.8 C), temperature source Oral, resp. rate 18, height 5' 10" (1.778 m), weight 260 lb 8 oz (118.162 kg), SpO2 96.00%.  General:  Well developed/well nourished in NAD Skin warm/dry Patient not depressed No peripheral clubbing Back-normal HEENT-normal/normal eyelids Neck supple/normal carotid upstroke bilaterally; no bruits; no JVD; no thyromegaly chest - CTA/ normal expansion CV - RRR/normal S1 and S2; no murmurs, rubs or gallops;  PMI nondisplaced Abdomen -NT/ND, no HSM, no mass, + bowel sounds, no bruit 2+ femoral pulses, no bruits Ext-no edema, chords, 2+ DP Neuro-grossly nonfocal  ECG sinus rhythm, previous inferior lateral infarct, no ST changes.  Results for orders placed during the hospital encounter of 12/19/13 (from the past 48 hour(s))    TROPONIN I     Status: None   Collection Time    12/19/13  3:37 PM      Result Value Ref Range   Troponin  I <0.30  <0.30 ng/mL   Comment:            Due to the release kinetics of cTnI,     a negative result within the first hours     of the onset of symptoms does not rule out     myocardial infarction with certainty.     If myocardial infarction is still suspected,     repeat the test at appropriate intervals.  CBC WITH DIFFERENTIAL     Status: Abnormal   Collection Time    12/19/13  3:37 PM      Result Value Ref Range   WBC 12.6 (*) 4.0 - 10.5 K/uL   RBC 5.12  4.22 - 5.81 MIL/uL   Hemoglobin 16.9  13.0 - 17.0 g/dL   HCT 47.7  39.0 - 52.0 %   MCV 93.2  78.0 - 100.0 fL   MCH 33.0  26.0 - 34.0 pg   MCHC 35.4  30.0 - 36.0 g/dL   RDW 13.9  11.5 - 15.5 %   Platelets 210  150 - 400 K/uL   Neutrophils Relative % 58  43 - 77 %   Neutro Abs 7.3  1.7 - 7.7 K/uL   Lymphocytes Relative 31  12 - 46 %   Lymphs Abs 4.0  0.7 - 4.0 K/uL   Monocytes Relative 8  3 - 12 %   Monocytes Absolute 1.0  0.1 - 1.0 K/uL   Eosinophils Relative 2  0 - 5 %   Eosinophils Absolute 0.3  0.0 - 0.7 K/uL   Basophils Relative 0  0 - 1 %   Basophils Absolute 0.0  0.0 - 0.1 K/uL  COMPREHENSIVE METABOLIC PANEL     Status: Abnormal   Collection Time    12/19/13  3:37 PM      Result Value Ref Range   Sodium 140  137 - 147 mEq/L   Potassium 4.5  3.7 - 5.3 mEq/L   Chloride 100  96 - 112 mEq/L   CO2 28  19 - 32 mEq/L   Glucose, Bld 72  70 - 99 mg/dL   BUN 17  6 - 23 mg/dL   Creatinine, Ser 1.03  0.50 - 1.35 mg/dL   Calcium 9.6  8.4 - 10.5 mg/dL   Total Protein 7.2  6.0 - 8.3 g/dL   Albumin 3.8  3.5 - 5.2 g/dL   AST 17  0 - 37 U/L   ALT 18  0 - 53 U/L   Alkaline Phosphatase 100  39 - 117 U/L   Total Bilirubin 0.3  0.3 - 1.2 mg/dL   GFR calc non Af Amer 76 (*) >90 mL/min   GFR calc Af Amer 89 (*) >90 mL/min   Comment: (NOTE)     The eGFR has been calculated using the CKD EPI equation.     This  calculation has not been validated in all clinical situations.     eGFR's persistently <90 mL/min signify possible Chronic Kidney     Disease.  D-DIMER, QUANTITATIVE     Status: Abnormal   Collection Time    12/19/13  8:12 PM      Result Value Ref Range   D-Dimer, Quant 0.70 (*) 0.00 - 0.48 ug/mL-FEU   Comment:            AT THE INHOUSE ESTABLISHED CUTOFF     VALUE OF 0.48 ug/mL FEU,     THIS ASSAY HAS BEEN DOCUMENTED  IN THE LITERATURE TO HAVE     A SENSITIVITY AND NEGATIVE     PREDICTIVE VALUE OF AT LEAST     98 TO 99%.  THE TEST RESULT     SHOULD BE CORRELATED WITH     AN ASSESSMENT OF THE CLINICAL     PROBABILITY OF DVT / VTE.  PRO B NATRIURETIC PEPTIDE     Status: None   Collection Time    12/19/13  8:12 PM      Result Value Ref Range   Pro B Natriuretic peptide (BNP) 106.6  0 - 125 pg/mL  TROPONIN I     Status: None   Collection Time    12/20/13  1:22 AM      Result Value Ref Range   Troponin I <0.30  <0.30 ng/mL   Comment:            Due to the release kinetics of cTnI,     a negative result within the first hours     of the onset of symptoms does not rule out     myocardial infarction with certainty.     If myocardial infarction is still suspected,     repeat the test at appropriate intervals.  CBC WITH DIFFERENTIAL     Status: None   Collection Time    12/20/13  1:22 AM      Result Value Ref Range   WBC 9.1  4.0 - 10.5 K/uL   RBC 4.94  4.22 - 5.81 MIL/uL   Hemoglobin 15.9  13.0 - 17.0 g/dL   HCT 45.7  39.0 - 52.0 %   MCV 92.5  78.0 - 100.0 fL   MCH 32.2  26.0 - 34.0 pg   MCHC 34.8  30.0 - 36.0 g/dL   RDW 14.0  11.5 - 15.5 %   Platelets 194  150 - 400 K/uL   Neutrophils Relative % 44  43 - 77 %   Neutro Abs 4.0  1.7 - 7.7 K/uL   Lymphocytes Relative 44  12 - 46 %   Lymphs Abs 4.0  0.7 - 4.0 K/uL   Monocytes Relative 8  3 - 12 %   Monocytes Absolute 0.8  0.1 - 1.0 K/uL   Eosinophils Relative 3  0 - 5 %   Eosinophils Absolute 0.3  0.0 - 0.7 K/uL    Basophils Relative 1  0 - 1 %   Basophils Absolute 0.1  0.0 - 0.1 K/uL  COMPREHENSIVE METABOLIC PANEL     Status: Abnormal   Collection Time    12/20/13  1:22 AM      Result Value Ref Range   Sodium 143  137 - 147 mEq/L   Potassium 3.8  3.7 - 5.3 mEq/L   Chloride 105  96 - 112 mEq/L   CO2 25  19 - 32 mEq/L   Glucose, Bld 141 (*) 70 - 99 mg/dL   BUN 20  6 - 23 mg/dL   Creatinine, Ser 0.93  0.50 - 1.35 mg/dL   Calcium 9.0  8.4 - 10.5 mg/dL   Total Protein 6.4  6.0 - 8.3 g/dL   Albumin 3.3 (*) 3.5 - 5.2 g/dL   AST 15  0 - 37 U/L   ALT 16  0 - 53 U/L   Alkaline Phosphatase 89  39 - 117 U/L   Total Bilirubin 0.2 (*) 0.3 - 1.2 mg/dL   GFR calc non Af Amer 89 (*) >90 mL/min  GFR calc Af Amer >90  >90 mL/min   Comment: (NOTE)     The eGFR has been calculated using the CKD EPI equation.     This calculation has not been validated in all clinical situations.     eGFR's persistently <90 mL/min signify possible Chronic Kidney     Disease.  PROTIME-INR     Status: None   Collection Time    12/20/13  1:22 AM      Result Value Ref Range   Prothrombin Time 12.7  11.6 - 15.2 seconds   INR 0.97  0.00 - 1.49  LIPID PANEL     Status: Abnormal   Collection Time    12/20/13  1:22 AM      Result Value Ref Range   Cholesterol 156  0 - 200 mg/dL   Triglycerides 354 (*) <150 mg/dL   HDL 17 (*) >39 mg/dL   Total CHOL/HDL Ratio 9.2     VLDL 71 (*) 0 - 40 mg/dL   LDL Cholesterol 68  0 - 99 mg/dL   Comment:            Total Cholesterol/HDL:CHD Risk     Coronary Heart Disease Risk Table                         Men   Women      1/2 Average Risk   3.4   3.3      Average Risk       5.0   4.4      2 X Average Risk   9.6   7.1      3 X Average Risk  23.4   11.0                Use the calculated Patient Ratio     above and the CHD Risk Table     to determine the patient's CHD Risk.                ATP III CLASSIFICATION (LDL):      <100     mg/dL   Optimal      100-129  mg/dL   Near or Above                         Optimal      130-159  mg/dL   Borderline      160-189  mg/dL   High      >190     mg/dL   Very High    Dg Chest 2 View  12/19/2013   CLINICAL DATA:  Chest pain  EXAM: CHEST  2 VIEW  COMPARISON:  09/13/2013  FINDINGS: Cardiomediastinal silhouette is stable. No acute infiltrate or pleural effusion. No pulmonary edema. Again noted chronic mild interstitial prominence. Stable postsurgical changes in right upper lobe. Bony thorax is unremarkable.  IMPRESSION: No active cardiopulmonary disease.   Electronically Signed   By: Lahoma Crocker M.D.   On: 12/19/2013 16:10   Ct Head Wo Contrast  12/19/2013   CLINICAL DATA:  63 year old male with blurred vision. Initial encounter.  EXAM: CT HEAD WITHOUT CONTRAST  TECHNIQUE: Contiguous axial images were obtained from the base of the skull through the vertex without intravenous contrast.  COMPARISON:  03/09/2004.  FINDINGS: Partially visible right maxillary sinus mucous retention cyst. Other Visualized paranasal sinuses and mastoids are clear. No acute osseous abnormality identified.  Postoperative changes to  the right globe. No acute orbit soft tissue findings identified. Negative scalp soft tissues.  Calcified atherosclerosis at the skull base. Cerebral volume is normal. No suspicious intracranial vascular hyperdensity. No midline shift, ventriculomegaly, mass effect, evidence of mass lesion, intracranial hemorrhage or evidence of cortically based acute infarction. Gray-white matter differentiation is within normal limits throughout the brain.  IMPRESSION: Stable and normal noncontrast CT appearance of the brain.   Electronically Signed   By: Lars Pinks M.D.   On: 12/19/2013 22:19    Assessment/Plan 1 chest pain-patient symptoms are atypical. They are unlike his previous infarct pain. They were continuous yesterday for 8-10 hours and his enzymes are negative. His electrocardiogram showed no ST changes. Question musculoskeletal pain. Patient can be  discharged from a cardiac standpoint. He should have an outpatient nuclear study and then followup with Dr. Harrington Challenger. D-dimer is elevated mildly. Further evaluation per primary care. May need chest CT. 2 CAD-continue aspirin, Plavix and beta blocker. Intolerant to statins. 3 tobacco abuse-patient counseled on discontinuing. 4 hypertension-blood pressure is controlled. Continue preadmission medications. Please call with questions.  Kirk Ruths MD 12/20/2013, 7:43 AM

## 2013-12-20 NOTE — Discharge Summary (Signed)
Physician Discharge Summary  [name] [id number] DOB: 02/21/1951 DOA: 12/19/2013  PCP: Walker Kehr, MD  Admit date: 12/19/2013 Discharge date: 12/20/2013  Time spent: 40 minutes  Recommendations for Outpatient Follow-up:  1. Followup with primary care physician within one week.  Discharge Diagnoses:  Principal Problem:   Chest pain Active Problems:   HYPERTENSION   CAROTID ARTERY DISEASE   PERIPHERAL VASCULAR DISEASE   Adult pulmonary Langerhans cell histiocytosis   OSA (obstructive sleep apnea)   Discharge Condition: Stable  Diet recommendation: Heart healthy diet  Filed Weights   12/19/13 1531 12/19/13 2117  Weight: 119.296 kg (263 lb) 118.162 kg (260 lb 8 oz)    History of present illness:  [name] is a 63 y.o. male with Past medical history of early artery disease status post PCI 2012, COPD, active smoker, but if her was her disease, GERD, dyslipidemia, hypertension, Langerhans histiocytosis.  The patient is coming from home.  The patient presented with complaints of chest pain that started earlier this morning around 10:00. The pain was felt like sharp stabbing pain in not associated with fever, chills, shortness of breath, cough. He initially felt this pain was indigestion and try some TUMS then tried some ibuprofen and that did not work his pain and that he try some nitroglycerin which improved the pain little and therefore came to the hospital. The pain started at rest.  He denies any similar pain in the past, recently he has been having on and off episodes of cold.  He denies any recent travel or surgery or immobilization. He does of some swelling of his legs.  he is compliant with his medications. He continues to smoke.  Along with this pain he also complained of some blurring of the vision without any focal deficit is present since morning. He mentions he had similar episodes in the past which resolved on its own. And he mentions his symptoms  are improving.  Hospital Course:   1. Chest pain: Patient presents to the hospital with sharp stabbing chest pain, is he is ruled out by 3 sets of cardiac enzymes and negative EKG. Patient has positive d-dimer is. So CT angio of the chest was done and showed no acute abnormalities. Patient is chest pain-free right now. Discharged home to followup with his primary cardiologist. Cardiology recommended stress test as outpatient.  2. Sleep apnea: Continue CPAP.  3. Langerhans histiocytosis/COPD: Patient is active smoker counseled extensively about his smoking, continued she was needed and home inhalers.  4. CAD with PVD: Patient is on aspirin and Plavix, but continues throughout the hospital stay. CT of the chest showed extensive calcification in the coronaries. Patient to followup with his cardiologist.   Procedures:  None  Consultations:  [place] [facility]  Discharge Exam: Filed Vitals:   12/20/13 0549  BP: 113/55  Pulse: 66  Temp: 98.2 F (36.8 C)  Resp: 18   General: Alert and awake, oriented x3, not in any acute distress. HEENT: anicteric sclera, pupils reactive to light and accommodation, EOMI CVS: S1-S2 clear, no murmur rubs or gallops Chest: clear to auscultation bilaterally, no wheezing, rales or rhonchi Abdomen: soft nontender, nondistended, normal bowel sounds, no organomegaly Extremities: no cyanosis, clubbing or edema noted bilaterally Neuro: Cranial nerves II-XII intact, no focal neurological deficits  Discharge Instructions  Discharge Orders   Future Appointments Provider Department Dept Phone   01/10/2014 8:00 AM Cassandria Anger, MD [facility] [phone]   02/10/2014 9:30 AM Dorris Fetch  Barry Dienes, [facility] [place]   03/18/2014 7:45 AM Cassandria Anger, MD [place]   Future Orders Complete By Expires   Diet - low sodium heart healthy  As directed    Increase  activity slowly  As directed        Medication List         aspirin 81 MG tablet  Take 81 mg by mouth daily.     Azilsartan Medoxomil 80 MG Tabs  Commonly known as:  EDARBI  Take 1 tablet (80 mg total) by mouth daily.     clopidogrel 75 MG tablet  Commonly known as:  PLAVIX  Take 75 mg by mouth daily with breakfast.     Co Q-10 200 MG Caps  Take 200 mg by mouth daily.     ezetimibe 10 MG tablet  Commonly known as:  ZETIA  Take 10 mg by mouth daily.     ibuprofen 200 MG tablet  Commonly known as:  ADVIL,MOTRIN  Take 400 mg by mouth every 4 (four) hours as needed.     metoprolol succinate 25 MG 24 hr tablet  Commonly known as:  TOPROL-XL  Take 2 tablets (50 mg total) by mouth daily. Take at night     naproxen 500 MG tablet  Commonly known as:  NAPROSYN  Take 500 mg by mouth daily as needed for mild pain.     nitroGLYCERIN 0.4 MG SL tablet  Commonly known as:  NITROSTAT  Place 1 tablet (0.4 mg total) under the tongue every 5 (five) minutes as needed for chest pain. May take up to 3 doses     pantoprazole 40 MG tablet  Commonly known as:  PROTONIX  Take 40 mg by mouth daily.     tiotropium 18 MCG inhalation capsule  Commonly known as:  SPIRIVA  Place 18 mcg into inhaler and inhale daily.     vitamin B-12 1000 MCG tablet  Commonly known as:  CYANOCOBALAMIN  Take 1,000 mcg by mouth daily.     vitamin C 500 MG tablet  Commonly known as:  ASCORBIC ACID  Take 1,000 mg by mouth daily.     Vitamin D3 1000 UNITS Caps  Take 1,000 Units by mouth daily.       Allergies  Allergen Reactions  . Pravastatin Sodium     REACTION: gasy, constipated  . Rosuvastatin     REACTION: myalgias  . Simvastatin     REACTION: myalgia       Follow-up Information   Follow up with Walker Kehr, MD In 1 week.   Specialty:  Internal Medicine   Contact information:   [address] [place] [address] [phone]       Follow up with Dorris Carnes, MD In 1 week.   Specialty:  Cardiology   Contact information:   [place] [address] [phone]        The results of significant diagnostics from this hospitalization (including imaging, microbiology, ancillary and laboratory) are listed below for reference.    Significant Diagnostic Studies: Dg Chest 2 View  12/19/2013   CLINICAL DATA:  Chest pain  EXAM: CHEST  2 VIEW  COMPARISON:  09/13/2013  FINDINGS: Cardiomediastinal silhouette is stable. No acute infiltrate or pleural effusion. No pulmonary edema. Again noted chronic mild interstitial prominence. Stable postsurgical changes in right upper lobe. Bony thorax is unremarkable.  IMPRESSION: No active cardiopulmonary disease.   Electronically Signed   By: Lahoma Crocker M.D.   On: 12/19/2013 16:10   Ct Head Wo Contrast  12/19/2013   CLINICAL DATA:  63 year old male with blurred vision. Initial encounter.  EXAM: CT HEAD WITHOUT CONTRAST  TECHNIQUE: Contiguous axial images were obtained from the base of the skull through the vertex without intravenous contrast.  COMPARISON:  03/09/2004.  FINDINGS: Partially visible right maxillary sinus mucous retention cyst. Other Visualized paranasal sinuses and mastoids are clear. No acute osseous abnormality identified.  Postoperative changes to the right globe. No acute orbit soft tissue findings identified. Negative scalp soft tissues.  Calcified atherosclerosis at the skull base. Cerebral volume is normal. No suspicious intracranial vascular hyperdensity. No midline shift, ventriculomegaly, mass effect, evidence of mass lesion, intracranial hemorrhage or evidence of cortically based acute infarction. Gray-white matter differentiation is within normal limits throughout the brain.  IMPRESSION: Stable and normal noncontrast CT appearance of the brain.   Electronically Signed   By: Lars Pinks M.D.   On: 12/19/2013 22:19   Ct Angio Chest Pe W/cm &/or Wo Cm  12/20/2013    CLINICAL DATA:  Chest pain. Coronary disease. Assess for pulmonary embolism.  EXAM: CT ANGIOGRAPHY CHEST WITH CONTRAST  TECHNIQUE: Multidetector CT imaging of the chest was performed using the standard protocol during bolus administration of intravenous contrast. Multiplanar CT image reconstructions and MIPs were obtained to evaluate the vascular anatomy.  CONTRAST:  127mL OMNIPAQUE IOHEXOL 350 MG/ML SOLN  COMPARISON:  Radiography 12/19/2013.  CT 08/14/2012.  FINDINGS: Pulmonary arterial opacification is excellent. There are no pulmonary emboli.  There is atherosclerosis of the aorta. No sign of dissection. There is extensive coronary artery calcification.  No pleural or pericardial fluid. No hilar or mediastinal mass or adenopathy.  The patient has had pulmonary resection at the right apex. There are areas of scarring and emphysema. Multiple small pulmonary nodular shadows are unchanged since previous exams. The single exception to that is a nodule in the left upper lobe on image 23 measuring 7 mm in diameter. Previously this measured 3 mm. CT follow-up of this nodule in 3-6 months is suggested.  Review of the MIP images confirms the above findings.  IMPRESSION: No pulmonary emboli.  Extensive coronary artery calcification.  Multiple small pulmonary nodule stable since previous exams. Single exception to this is a 7 mm nodule in the left upper lobe on image 23, enlarged since the study of 08/14/2012. Follow-up in 3-6 months is suggested.   Electronically Signed   By: Nelson Chimes M.D.   On: 12/20/2013 09:21    Microbiology: No results found for this or any previous visit (from the past 240 hour(s)).   Labs: Basic Metabolic Panel:  Recent Labs Lab 12/19/13 1537 12/20/13 0122  NA 140 143  K 4.5 3.8  CL 100 105  CO2 28 25  GLUCOSE 72 141*  BUN 17 20  CREATININE 1.03 0.93  CALCIUM 9.6 9.0   Liver Function Tests:  Recent Labs Lab 12/19/13 1537 12/20/13 0122  AST 17 15  ALT 18 16  ALKPHOS  100 89  BILITOT 0.3 0.2*  PROT 7.2 6.4  ALBUMIN 3.8 3.3*   No results found for this basename: LIPASE, AMYLASE,  in the last 168 hours No results found for this basename: AMMONIA,  in the last 168 hours CBC:  Recent Labs Lab 12/19/13 1537 12/20/13 0122  WBC 12.6* 9.1  NEUTROABS 7.3 4.0  HGB 16.9 15.9  HCT  47.7 45.7  MCV 93.2 92.5  PLT 210 194   Cardiac Enzymes:  Recent Labs Lab 12/19/13 1537 12/20/13 0122 12/20/13 0745  TROPONINI <0.30 <0.30 <0.30   BNP: BNP (last 3 results)  Recent Labs  12/19/13 2012  PROBNP 106.6   CBG: No results found for this basename: GLUCAP,  in the last 168 hours     Signed:  Aalia Greulich A  [facility] 12/20/2013, 10:25 AM

## 2013-12-20 NOTE — ED Provider Notes (Signed)
I have personally seen and examined the patient.  I have discussed the plan of care with the resident.  I have reviewed the documentation on PMH/FH/Soc. History.  I have reviewed the documentation of the resident and agree.   Date: 12/20/2013  Rate: 87  Rhythm: normal sinus rhythm  QRS Axis: normal  Intervals: normal  ST/T Wave abnormalities: T wave inversion  Conduction Disutrbances:none  Narrative Interpretation:   Old EKG Reviewed: changes noted    Sharyon Cable, MD 12/20/13 1020

## 2014-01-08 ENCOUNTER — Other Ambulatory Visit (INDEPENDENT_AMBULATORY_CARE_PROVIDER_SITE_OTHER): Payer: 59

## 2014-01-08 DIAGNOSIS — I10 Essential (primary) hypertension: Secondary | ICD-10-CM

## 2014-01-08 DIAGNOSIS — R5383 Other fatigue: Secondary | ICD-10-CM

## 2014-01-08 DIAGNOSIS — R5381 Other malaise: Secondary | ICD-10-CM

## 2014-01-08 LAB — CBC WITH DIFFERENTIAL/PLATELET
BASOS ABS: 0 10*3/uL (ref 0.0–0.1)
Basophils Relative: 0.3 % (ref 0.0–3.0)
EOS ABS: 0.2 10*3/uL (ref 0.0–0.7)
EOS PCT: 1.5 % (ref 0.0–5.0)
HEMATOCRIT: 47.3 % (ref 39.0–52.0)
Hemoglobin: 16.3 g/dL (ref 13.0–17.0)
LYMPHS ABS: 2.7 10*3/uL (ref 0.7–4.0)
Lymphocytes Relative: 27.5 % (ref 12.0–46.0)
MCHC: 34.5 g/dL (ref 30.0–36.0)
MCV: 92 fl (ref 78.0–100.0)
Monocytes Absolute: 0.9 10*3/uL (ref 0.1–1.0)
Monocytes Relative: 9.1 % (ref 3.0–12.0)
Neutro Abs: 6 10*3/uL (ref 1.4–7.7)
Neutrophils Relative %: 61.6 % (ref 43.0–77.0)
PLATELETS: 223 10*3/uL (ref 150.0–400.0)
RBC: 5.14 Mil/uL (ref 4.22–5.81)
RDW: 13.7 % (ref 11.5–14.6)
WBC: 9.8 10*3/uL (ref 4.5–10.5)

## 2014-01-08 LAB — TSH: TSH: 1.91 u[IU]/mL (ref 0.35–5.50)

## 2014-01-08 LAB — BASIC METABOLIC PANEL
BUN: 15 mg/dL (ref 6–23)
CO2: 28 mEq/L (ref 19–32)
CREATININE: 1 mg/dL (ref 0.4–1.5)
Calcium: 9.3 mg/dL (ref 8.4–10.5)
Chloride: 105 mEq/L (ref 96–112)
GFR: 84.45 mL/min (ref >60.00)
Glucose, Bld: 105 mg/dL — ABNORMAL HIGH (ref 70–99)
Potassium: 4.3 mEq/L (ref 3.5–5.1)
Sodium: 139 mEq/L (ref 135–145)

## 2014-01-10 ENCOUNTER — Encounter: Payer: Self-pay | Admitting: Internal Medicine

## 2014-01-10 ENCOUNTER — Ambulatory Visit (INDEPENDENT_AMBULATORY_CARE_PROVIDER_SITE_OTHER): Payer: 59 | Admitting: Internal Medicine

## 2014-01-10 VITALS — BP 154/90 | HR 77 | Temp 99.2°F | Wt 272.0 lb

## 2014-01-10 DIAGNOSIS — I6529 Occlusion and stenosis of unspecified carotid artery: Secondary | ICD-10-CM

## 2014-01-10 DIAGNOSIS — I2510 Atherosclerotic heart disease of native coronary artery without angina pectoris: Secondary | ICD-10-CM

## 2014-01-10 DIAGNOSIS — M545 Low back pain, unspecified: Secondary | ICD-10-CM

## 2014-01-10 DIAGNOSIS — E538 Deficiency of other specified B group vitamins: Secondary | ICD-10-CM

## 2014-01-10 MED ORDER — SUCRALFATE 1 G PO TABS: 1.0000 g | ORAL_TABLET | Freq: Three times a day (TID) | ORAL | Status: DC

## 2014-01-10 NOTE — Assessment & Plan Note (Signed)
[place] Continue with current prescription therapy as reflected on the Med list.

## 2014-01-10 NOTE — Assessment & Plan Note (Signed)
Continue with current prescription therapy as reflected on the Med list.  

## 2014-01-10 NOTE — Assessment & Plan Note (Signed)
Continue with current prn prescription therapy as reflected on the Med list.  

## 2014-01-10 NOTE — Progress Notes (Signed)
Subjective:     HPI 1. [place] f/up (2/15): 2.   Principal Problem:  Chest pain  Active Problems:  HYPERTENSION  CAROTID ARTERY DISEASE  PERIPHERAL VASCULAR DISEASE  Adult pulmonary Langerhans cell histiocytosis  OSA (obstructive sleep apnea)  Discharge Condition: Stable  Diet recommendation: Heart healthy diet  Filed Weights    12/19/13 1531  12/19/13 2117   Weight:  119.296 kg (263 lb)  118.162 kg (260 lb 8 oz)   History of present illness:  [name] is a 63 y.o. male with Past medical history of early artery disease status post PCI 2012, COPD, active smoker, but if her was her disease, GERD, dyslipidemia, hypertension, Langerhans histiocytosis.  The patient is coming from home.  The patient presented with complaints of chest pain that started earlier this morning around 10:00. The pain was felt like sharp stabbing pain in not associated with fever, chills, shortness of breath, cough. He initially felt this pain was indigestion and try some TUMS then tried some ibuprofen and that did not work his pain and that he try some nitroglycerin which improved the pain little and therefore came to the hospital. The pain started at rest.  He denies any similar pain in the past, recently he has been having on and off episodes of cold.  He denies any recent travel or surgery or immobilization. He does of some swelling of his legs.  he is compliant with his medications. He continues to smoke.  Along with this pain he also complained of some blurring of the vision without any focal deficit is present since morning. He mentions he had similar episodes in the past which resolved on its own. And he mentions his symptoms are improving.  Hospital Course:  1. Chest pain: Patient presents to the hospital with sharp stabbing chest pain, is he is ruled out by 3 sets of cardiac enzymes and negative EKG. Patient has positive d-dimer is. So CT angio of the chest was done and showed no acute  abnormalities. Patient is chest pain-free right now. Discharged home to followup with his primary cardiologist. Cardiology recommended stress test as outpatient.  2. Sleep apnea: Continue CPAP.  3. Langerhans histiocytosis/COPD: Patient is active smoker counseled extensively about his smoking, continued she was needed and home inhalers.  4. CAD with PVD: Patient is on aspirin and Plavix, but continues throughout the hospital stay. CT of the chest showed extensive calcification in the coronaries. Patient to followup with his cardiologist.  Procedures:  None Consultations:  [place] [facility]    He was in the ER for high BP SBP 170-208 DBP100-110 Salty snacks, wt gain  The patient presents for a follow-up of  chronic hypertension, chronic dyslipidemia, PVD, CAD controlled with medicines. He started Tricor. He doesn't take  Lipitor. F/u ED. F/u bruising - resolved. F/u B IT pain. C/o fatigue from Metoprolol or other meds. C/o HAs  Wt Readings from Last 3 Encounters:  01/10/14 272 lb (123.378 kg)  12/19/13 260 lb 8 oz (118.162 kg)  12/19/13 262 lb (118.842 kg)   BP Readings from Last 3 Encounters:  01/10/14 154/90  12/20/13 128/74  12/19/13 132/86     Review of Systems  Constitutional: Negative for appetite change, fatigue and unexpected weight change.  HENT: Negative for congestion, nosebleeds, sneezing, sore throat and trouble swallowing.   Eyes: Negative for itching and visual disturbance.  Respiratory: Positive for cough.   Cardiovascular: Negative for palpitations and leg swelling.  Gastrointestinal: Negative  for nausea, diarrhea, blood in stool and abdominal distention.  Genitourinary: Negative for frequency and hematuria.  Musculoskeletal: Negative for back pain, gait problem, joint swelling and neck pain.  Skin: Negative for rash.  Neurological: Negative for dizziness, tremors, speech difficulty and weakness.  Psychiatric/Behavioral: Negative for suicidal ideas, sleep  disturbance, dysphoric mood and agitation. The patient is not nervous/anxious.        Objective:   Physical Exam  Constitutional: He is oriented to person, place, and time. He appears well-developed.  Obese  HENT:  Mouth/Throat: Oropharynx is clear and moist.  Eyes: Conjunctivae are normal. Pupils are equal, round, and reactive to light.  Neck: Normal range of motion. No JVD present. No thyromegaly present.  Cardiovascular: Normal rate, regular rhythm, normal heart sounds and intact distal pulses.  Exam reveals no gallop and no friction rub.   No murmur heard. Pulmonary/Chest: Effort normal and breath sounds normal. No respiratory distress. He has no wheezes. He has no rales. He exhibits no tenderness.  Abdominal: Soft. Bowel sounds are normal. He exhibits no distension and no mass. There is no tenderness. There is no rebound and no guarding.  Musculoskeletal: Normal range of motion. He exhibits no edema and no tenderness.  R lat foot oval ellastic mass 2x1.5 cm  Lymphadenopathy:    He has no cervical adenopathy.  Neurological: He is alert and oriented to person, place, and time. He has normal reflexes. No cranial nerve deficit. He exhibits normal muscle tone. Coordination normal.  Skin: Skin is warm and dry. No rash noted.  Psychiatric: He has a normal mood and affect. His behavior is normal. Judgment and thought content normal.  Lipomas - chest    Lab Results  Component Value Date   WBC 9.8 01/08/2014   HGB 16.3 01/08/2014   HCT 47.3 01/08/2014   PLT 223.0 01/08/2014   GLUCOSE 105* 01/08/2014   CHOL 156 12/20/2013   TRIG 354* 12/20/2013   HDL 17* 12/20/2013   LDLDIRECT 111.7 08/07/2013   LDLCALC 68 12/20/2013   ALT 16 12/20/2013   AST 15 12/20/2013   NA 139 01/08/2014   K 4.3 01/08/2014   CL 105 01/08/2014   CREATININE 1.0 01/08/2014   BUN 15 01/08/2014   CO2 28 01/08/2014   TSH 1.91 01/08/2014   PSA 0.48 11/03/2010   INR 0.97 12/20/2013   HGBA1C 5.5 12/20/2013        Assessment &  Plan:

## 2014-01-10 NOTE — Progress Notes (Signed)
Pre visit review using our clinic review tool, if applicable. No additional management support is needed unless otherwise documented below in the visit note. 

## 2014-01-16 LAB — ALDOSTERONE + RENIN ACTIVITY W/ RATIO
ALDO / PRA Ratio: 1.6 Ratio (ref 0.9–28.9)
Aldosterone: <1 ng/dL
PRA LC/MS/MS: 0.63 ng/mL/h (ref 0.25–5.82)

## 2014-02-10 ENCOUNTER — Encounter (INDEPENDENT_AMBULATORY_CARE_PROVIDER_SITE_OTHER): Payer: 59 | Admitting: General Surgery

## 2014-02-16 ENCOUNTER — Other Ambulatory Visit: Payer: Self-pay | Admitting: Internal Medicine

## 2014-02-16 ENCOUNTER — Other Ambulatory Visit: Payer: Self-pay | Admitting: Pulmonary Disease

## 2014-02-17 ENCOUNTER — Ambulatory Visit (HOSPITAL_COMMUNITY): Payer: 59 | Attending: Cardiology | Admitting: Cardiology

## 2014-02-17 DIAGNOSIS — I6529 Occlusion and stenosis of unspecified carotid artery: Secondary | ICD-10-CM

## 2014-02-18 NOTE — Progress Notes (Signed)
Carotid duplex performed 

## 2014-02-19 NOTE — Telephone Encounter (Signed)
Rx called in as directed.   

## 2014-03-18 ENCOUNTER — Ambulatory Visit: Payer: 59 | Admitting: Internal Medicine

## 2014-03-19 ENCOUNTER — Other Ambulatory Visit: Payer: Self-pay | Admitting: Internal Medicine

## 2014-03-20 NOTE — Progress Notes (Signed)
HPI [name] is a 63 y.o. male with a history of CAD. He is s/p IWMI in April 2012. Cath showed 80% then 100% RCA. Both lesions were treated with Promuse stens. He had moderated residual disease of the PDA and PLSA. LVEF was 55%  He also has a history of COPD, PAD, hypertension, hyperlipidemia and sleep apnea (wears CPAP) He was last in clinic APril 2014 He was admitted earlier this year with chest pain  R/O for MI  Seen by cardiology  Felt atypical.      Allergies  Allergen Reactions  . Pravastatin Sodium     REACTION: gasy, constipated  . Rosuvastatin     REACTION: myalgias  . Simvastatin     REACTION: myalgia    Current Outpatient Prescriptions  Medication Sig Dispense Refill  . aspirin 81 MG tablet Take 81 mg by mouth daily.       . Azilsartan Medoxomil (EDARBI) 80 MG TABS Take 1 tablet (80 mg total) by mouth daily.  90 tablet  3  . Cholecalciferol (VITAMIN D3) 1000 UNITS CAPS Take 1,000 Units by mouth daily.       . clopidogrel (PLAVIX) 75 MG tablet TAKE 1 TABLET (75 MG TOTAL) BY MOUTH DAILY.  90 tablet  0  . Coenzyme Q10 (CO Q-10) 200 MG CAPS Take 200 mg by mouth daily.       Marland Kitchen ezetimibe (ZETIA) 10 MG tablet Take 1 tablet (10 mg total) by mouth daily.  90 tablet  3  . ibuprofen (ADVIL,MOTRIN) 200 MG tablet Take 400 mg by mouth every 4 (four) hours as needed.      . metoprolol succinate (TOPROL-XL) 25 MG 24 hr tablet Take 1 tablet (25 mg total) by mouth daily. Take at night      . naproxen (NAPROSYN) 500 MG tablet Take 500 mg by mouth daily as needed for mild pain.      . nitroGLYCERIN (NITROSTAT) 0.4 MG SL tablet Place 1 tablet (0.4 mg total) under the tongue every 5 (five) minutes as needed for chest pain. May take up to 3 doses  25 tablet  3  . pantoprazole (PROTONIX) 40 MG tablet Take 1 tablet (40 mg total) by mouth daily.  90 tablet  3  . sucralfate (CARAFATE) 1 G tablet Take 1 tablet (1 g total) by mouth 4 (four) times daily -  with meals and at bedtime.  90 tablet   11  . tiotropium (SPIRIVA) 18 MCG inhalation capsule Place 18 mcg into inhaler and inhale daily.      . traMADol (ULTRAM) 50 MG tablet TAKE 1-2 TABLETS BY MOUTH TWO TIMES A DAY AS NEEDED  120 tablet  1  . vitamin B-12 (CYANOCOBALAMIN) 1000 MCG tablet Take 1,000 mcg by mouth daily.       . vitamin C (ASCORBIC ACID) 500 MG tablet Take 1,000 mg by mouth daily.       Marland Kitchen triamterene-hydrochlorothiazide (MAXZIDE-25) 37.5-25 MG per tablet Take 0.5 tablets by mouth daily.  90 tablet  3   No current facility-administered medications for this visit.    Past Medical History  Diagnosis Date  . Hiatal hernia   . Duodenitis   . Renal cyst   . Bell's palsy   . Hypercholesteremia   . Diverticulosis   . Cyst     sebacceous  . Renal cell carcinoma   . Low back pain   . Hypertension   . Vitamin B12 deficiency   . PVD (peripheral vascular  disease)     s/p right iliofem. bypass  . Pancreatitis   . GERD (gastroesophageal reflux disease)   . COPD (chronic obstructive pulmonary disease)   . Colon polyps   . Coronary artery disease     a. s/p INF STEMI 4/12: DES x 2 (mid and dist RCA);  b. cath 4/12: oLAD 40%, mLAD 40%, pD1 30%, pOM1 40%, pOM3 50%, mRCA 80% (PCI), PDA 70% (med tx), PLB1 60%  (med tx); EF 55%  . Meralgia paresthetica     right  . Erectile dysfunction   . OSA (obstructive sleep apnea)     PSG 03/02/11>>AHI 28.6, SpO2 low 88%, CPAP 10 cm H2O.  . 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 Myocardial infarction   . Shortness of breath     Past Surgical History  Procedure Laterality Date  . Bypass graft      Rt. iliofemoral   . Inguinal hernia repair      Rt.  . Tumor removal      Cryo Rena  . Colonoscopy    . Upper gastrointestinal endoscopy    . Video assisted thoracoscopy  July 2013    Rt upper and Rt lower lobe wedge resections  . Coronary angioplasty  01/2011  . Mass excision Right 09/19/2013    Procedure: Excision back mass x2;   Surgeon: Stark Klein, MD;  Location: [facility];  Service: General;  Laterality: Right;  . Excision of skin tag N/A 09/19/2013    Procedure: EXCISION OF SKIN TAG;  Surgeon: Stark Klein, MD;  Location: [facility];  Service: General;  Laterality: N/A;  . Femoral bypass Right ?   2005    Family History  Problem Relation Age of Onset  . Colon cancer Paternal Grandmother   . Cancer Paternal Grandmother     colon  . Hypertension    . Stroke Father   . Hypertension Mother     History   Social History  . Marital Status: Divorced    Spouse Name: N/A    Number of Children: N/A  . Years of Education: N/A   Occupational History  . business Freight forwarder     [employer]   Social History Main Topics  . Smoking status: Current Every Day Smoker -- 0.75 packs/day for 40 years    Types: Cigarettes  . Smokeless tobacco: Never Used  . Alcohol Use: Yes     Comment: occasionally couple of drinks a month  . Drug Use: No  . Sexual Activity: Yes   Other Topics Concern  . Not on file   Social History Narrative  . No narrative on file    Review of Systems:  All systems reviewed.  They are negative to the above problem except as previously stated.  Vital Signs: BP 147/92  Pulse 82  Ht 5\' 10"  (1.778 m)  Wt 270 lb 1.9 oz (122.526 kg)  BMI 38.76 kg/m2  Physical Exam Patient is in NAD HEENT:  Normocephalic, atraumatic. EOMI, PERRLA.  Neck: JVP is normal.  No bruits.  Lungs: clear to auscultation. No rales no wheezes.  Heart: Regular rate and rhythm. Normal S1, S2. No S3.   No significant murmurs. PMI not displaced.  Abdomen:  Supple, nontender. Normal bowel sounds. No masses. No hepatomegaly.  Extremities:   Good distal pulses throughout. No lower extremity edema.  Musculoskeletal :moving all extremities.  Neuro:   alert and oriented x3.  CN II-XII grossly intact.  Assessment and Plan:  1.  CAD  I am convinced recent CP that lead to admission was angina.  I would continue medical Rx  I would not plan  further testing.   2.  HTN  BP is still high  I would add 1/2 Maxzide to regimen (37.5/25) Cut metoprolol to qd Follow up this summer.    4.  Tob  Counselled on cessation.

## 2014-03-21 ENCOUNTER — Encounter: Payer: Self-pay | Admitting: Internal Medicine

## 2014-03-21 ENCOUNTER — Ambulatory Visit (INDEPENDENT_AMBULATORY_CARE_PROVIDER_SITE_OTHER): Payer: 59 | Admitting: Internal Medicine

## 2014-03-21 VITALS — BP 147/92 | HR 82 | Ht 70.0 in | Wt 270.1 lb

## 2014-03-21 DIAGNOSIS — F17200 Nicotine dependence, unspecified, uncomplicated: Secondary | ICD-10-CM

## 2014-03-21 DIAGNOSIS — I2510 Atherosclerotic heart disease of native coronary artery without angina pectoris: Secondary | ICD-10-CM

## 2014-03-21 DIAGNOSIS — I10 Essential (primary) hypertension: Secondary | ICD-10-CM

## 2014-03-21 DIAGNOSIS — Z720 Tobacco use: Secondary | ICD-10-CM

## 2014-03-21 DIAGNOSIS — E785 Hyperlipidemia, unspecified: Secondary | ICD-10-CM

## 2014-03-21 MED ORDER — PANTOPRAZOLE SODIUM 40 MG PO TBEC: 40.0000 mg | DELAYED_RELEASE_TABLET | Freq: Every day | ORAL | Status: DC

## 2014-03-21 MED ORDER — TRIAMTERENE-HCTZ 37.5-25 MG PO TABS: 1.0000 | ORAL_TABLET | Freq: Every day | ORAL | Status: DC

## 2014-03-21 MED ORDER — METOPROLOL SUCCINATE ER 25 MG PO TB24: 25.0000 mg | ORAL_TABLET | Freq: Every day | ORAL | Status: DC

## 2014-03-21 MED ORDER — TRIAMTERENE-HCTZ 37.5-25 MG PO TABS: 0.5000 | ORAL_TABLET | Freq: Every day | ORAL | Status: AC

## 2014-03-21 MED ORDER — EZETIMIBE 10 MG PO TABS: 10.0000 mg | ORAL_TABLET | Freq: Every day | ORAL | Status: AC

## 2014-03-21 NOTE — Patient Instructions (Addendum)
YOU HAVE A FOLLOW UP WITH DR. ROSS 06/02/14 8:30  DECREASE METOPROLOL  TO 25 MG DAILY START MAXZIDE 37.5/25 MG ; TAKE 1/2 TABLET DAILY  REFILLS SENT IN FOR ZETIA AND PROTONIX AS WELL

## 2014-04-15 ENCOUNTER — Encounter: Payer: Self-pay | Admitting: *Deleted

## 2014-04-23 ENCOUNTER — Encounter: Payer: Self-pay | Admitting: Internal Medicine

## 2014-04-23 ENCOUNTER — Ambulatory Visit (INDEPENDENT_AMBULATORY_CARE_PROVIDER_SITE_OTHER): Payer: 59 | Admitting: Internal Medicine

## 2014-04-23 ENCOUNTER — Encounter: Payer: Self-pay | Admitting: Gastroenterology

## 2014-04-23 VITALS — BP 160/82 | HR 76 | Temp 99.3°F | Resp 16 | Wt 267.0 lb

## 2014-04-23 DIAGNOSIS — E538 Deficiency of other specified B group vitamins: Secondary | ICD-10-CM

## 2014-04-23 DIAGNOSIS — I739 Peripheral vascular disease, unspecified: Secondary | ICD-10-CM

## 2014-04-23 DIAGNOSIS — K2100 Gastro-esophageal reflux disease with esophagitis, without bleeding: Secondary | ICD-10-CM

## 2014-04-23 DIAGNOSIS — J439 Emphysema, unspecified: Secondary | ICD-10-CM

## 2014-04-23 DIAGNOSIS — I2510 Atherosclerotic heart disease of native coronary artery without angina pectoris: Secondary | ICD-10-CM

## 2014-04-23 DIAGNOSIS — J438 Other emphysema: Secondary | ICD-10-CM

## 2014-04-23 MED ORDER — PROMETHAZINE-CODEINE 6.25-10 MG/5ML PO SYRP: 5.0000 mL | ORAL_SOLUTION | ORAL | Status: DC | PRN

## 2014-04-23 NOTE — Assessment & Plan Note (Signed)
Continue with current prescription therapy as reflected on the Med list.  

## 2014-04-23 NOTE — Progress Notes (Signed)
   Subjective:     HPI  C/o URI x 1 d  Salty snacks, wt gain  The patient presents for a follow-up of  chronic hypertension, chronic dyslipidemia, PVD, CAD controlled with medicines. He started Tricor. He doesn't take  Lipitor. F/u ED. F/u bruising - resolved. F/u B IT pain. C/o fatigue from Metoprolol or other meds. C/o HAs  Wt Readings from Last 3 Encounters:  04/23/14 267 lb (121.11 kg)  03/21/14 270 lb 1.9 oz (122.526 kg)  01/10/14 272 lb (123.378 kg)   BP Readings from Last 3 Encounters:  04/23/14 160/82  03/21/14 147/92  01/10/14 154/90     Review of Systems  Constitutional: Negative for appetite change, fatigue and unexpected weight change.  HENT: Negative for congestion, nosebleeds, sneezing, sore throat and trouble swallowing.   Eyes: Negative for itching and visual disturbance.  Respiratory: Positive for cough.   Cardiovascular: Negative for palpitations and leg swelling.  Gastrointestinal: Negative for nausea, diarrhea, blood in stool and abdominal distention.  Genitourinary: Negative for frequency and hematuria.  Musculoskeletal: Negative for back pain, gait problem, joint swelling and neck pain.  Skin: Negative for rash.  Neurological: Negative for dizziness, tremors, speech difficulty and weakness.  Psychiatric/Behavioral: Negative for suicidal ideas, sleep disturbance, dysphoric mood and agitation. The patient is not nervous/anxious.        Objective:   Physical Exam  Constitutional: He is oriented to person, place, and time. He appears well-developed.  Obese  HENT:  Mouth/Throat: Oropharynx is clear and moist.  Eyes: Conjunctivae are normal. Pupils are equal, round, and reactive to light.  Neck: Normal range of motion. No JVD present. No thyromegaly present.  Cardiovascular: Normal rate, regular rhythm, normal heart sounds and intact distal pulses.  Exam reveals no gallop and no friction rub.   No murmur heard. Pulmonary/Chest: Effort normal and breath  sounds normal. No respiratory distress. He has no wheezes. He has no rales. He exhibits no tenderness.  Abdominal: Soft. Bowel sounds are normal. He exhibits no distension and no mass. There is no tenderness. There is no rebound and no guarding.  Musculoskeletal: Normal range of motion. He exhibits no edema and no tenderness.  R lat foot oval ellastic mass 2x1.5 cm  Lymphadenopathy:    He has no cervical adenopathy.  Neurological: He is alert and oriented to person, place, and time. He has normal reflexes. No cranial nerve deficit. He exhibits normal muscle tone. Coordination normal.  Skin: Skin is warm and dry. No rash noted.  Psychiatric: He has a normal mood and affect. His behavior is normal. Judgment and thought content normal.  Lipomas - chest eryth throat  Lab Results  Component Value Date   WBC 9.8 01/08/2014   HGB 16.3 01/08/2014   HCT 47.3 01/08/2014   PLT 223.0 01/08/2014   GLUCOSE 105* 01/08/2014   CHOL 156 12/20/2013   TRIG 354* 12/20/2013   HDL 17* 12/20/2013   LDLDIRECT 111.7 08/07/2013   LDLCALC 68 12/20/2013   ALT 16 12/20/2013   AST 15 12/20/2013   NA 139 01/08/2014   K 4.3 01/08/2014   CL 105 01/08/2014   CREATININE 1.0 01/08/2014   BUN 15 01/08/2014   CO2 28 01/08/2014   TSH 1.91 01/08/2014   PSA 0.48 11/03/2010   INR 0.97 12/20/2013   HGBA1C 5.5 12/20/2013        Assessment & Plan:

## 2014-04-23 NOTE — Assessment & Plan Note (Addendum)
Continue with current prescription therapy as reflected on the Med list. Smoking discussed

## 2014-04-23 NOTE — Assessment & Plan Note (Signed)
6/15 viral x 1 d Rx: Prom-cod syr

## 2014-04-23 NOTE — Progress Notes (Deleted)
Pre visit review using our clinic review tool, if applicable. No additional management support is needed unless otherwise documented below in the visit note. 

## 2014-05-18 ENCOUNTER — Other Ambulatory Visit: Payer: Self-pay | Admitting: Internal Medicine

## 2014-05-20 ENCOUNTER — Other Ambulatory Visit: Payer: Self-pay

## 2014-05-30 ENCOUNTER — Other Ambulatory Visit: Payer: Self-pay | Admitting: Pulmonary Disease

## 2014-06-02 ENCOUNTER — Ambulatory Visit (INDEPENDENT_AMBULATORY_CARE_PROVIDER_SITE_OTHER): Payer: 59 | Admitting: Internal Medicine

## 2014-06-02 ENCOUNTER — Encounter: Payer: Self-pay | Admitting: Internal Medicine

## 2014-06-02 VITALS — BP 143/84 | HR 86 | Ht 70.0 in | Wt 260.0 lb

## 2014-06-02 DIAGNOSIS — I209 Angina pectoris, unspecified: Secondary | ICD-10-CM

## 2014-06-02 DIAGNOSIS — I25119 Atherosclerotic heart disease of native coronary artery with unspecified angina pectoris: Secondary | ICD-10-CM

## 2014-06-02 DIAGNOSIS — I10 Essential (primary) hypertension: Secondary | ICD-10-CM

## 2014-06-02 DIAGNOSIS — I2510 Atherosclerotic heart disease of native coronary artery without angina pectoris: Secondary | ICD-10-CM

## 2014-06-02 LAB — BASIC METABOLIC PANEL
BUN: 21 mg/dL (ref 6–23)
CALCIUM: 9.2 mg/dL (ref 8.4–10.5)
CO2: 23 meq/L (ref 19–32)
CREATININE: 1 mg/dL (ref 0.4–1.5)
Chloride: 102 mEq/L (ref 96–112)
GFR: 81.4 mL/min (ref >60.00)
GLUCOSE: 97 mg/dL (ref 70–99)
Potassium: 4 mEq/L (ref 3.5–5.1)
Sodium: 133 mEq/L — ABNORMAL LOW (ref 135–145)

## 2014-06-02 NOTE — Patient Instructions (Signed)
Your physician recommends that you continue on your current medications as directed. Please refer to the Current Medication list given to you today.  Lab today: BMET  Your physician recommends that you schedule a follow-up appointment in: End of January/Beginning of February with Dr. Harrington Challenger.

## 2014-06-02 NOTE — Progress Notes (Signed)
HPI [name] is a 63 y.o. male with a history of CAD. He is s/p IWMI in April 2012. Cath showed 80% then 100% RCA. Both lesions were treated with Promuse stens. He had moderated residual disease of the PDA and PLSA. LVEF was 55%  He also has a history of COPD, PAD, hypertension, hyperlipidemia and sleep apnea (wears CPAP) He was seen in April  BP was high  MEds adjusted  He was then seen in May  In May it was high again, 1/2 maxzide added. He was admitted earlier this year with chest pain  R/O for MI  Seen by cardiology  Felt atypical.    SInce I saw him he has done well  One episode of squeezing chest tightness  Not like angina Breathing is good  Wt down 7 lbs  Cut back on tobacco Wedding in 2 wks.    Allergies  Allergen Reactions  . Pravastatin Sodium     REACTION: gasy, constipated  . Rosuvastatin     REACTION: myalgias  . Simvastatin     REACTION: myalgia    Current Outpatient Prescriptions  Medication Sig Dispense Refill  . aspirin 81 MG tablet Take 81 mg by mouth daily.       . Azilsartan Medoxomil (EDARBI) 80 MG TABS Take 1 tablet (80 mg total) by mouth daily.  90 tablet  3  . Cholecalciferol (VITAMIN D3) 1000 UNITS CAPS Take 1,000 Units by mouth daily.       . clopidogrel (PLAVIX) 75 MG tablet TAKE 1 TABLET (75 MG TOTAL) BY MOUTH DAILY.  90 tablet  0  . Coenzyme Q10 (CO Q-10) 200 MG CAPS Take 200 mg by mouth daily.       Marland Kitchen ezetimibe (ZETIA) 10 MG tablet Take 1 tablet (10 mg total) by mouth daily.  90 tablet  3  . ibuprofen (ADVIL,MOTRIN) 200 MG tablet Take 400 mg by mouth every 4 (four) hours as needed.      . metoprolol succinate (TOPROL-XL) 25 MG 24 hr tablet Take 1 tablet (25 mg total) by mouth daily. Take at night      . pantoprazole (PROTONIX) 40 MG tablet Take 1 tablet (40 mg total) by mouth daily.  90 tablet  3  . SPIRIVA HANDIHALER 18 MCG inhalation capsule PLACE 1 CAPSULE (18 MCG TOTAL) INTO INHALER AND INHALE DAILY.  30 capsule  0  . sucralfate (CARAFATE) 1  G tablet Take 1 tablet (1 g total) by mouth 4 (four) times daily -  with meals and at bedtime.  90 tablet  11  . tiotropium (SPIRIVA) 18 MCG inhalation capsule Place 18 mcg into inhaler and inhale daily.      . traMADol (ULTRAM) 50 MG tablet TAKE 1-2 TABLETS BY MOUTH TWO TIMES A DAY AS NEEDED  120 tablet  1  . triamterene-hydrochlorothiazide (MAXZIDE-25) 37.5-25 MG per tablet Take 0.5 tablets by mouth daily.  90 tablet  3  . vitamin B-12 (CYANOCOBALAMIN) 1000 MCG tablet Take 1,000 mcg by mouth daily.       . vitamin C (ASCORBIC ACID) 500 MG tablet Take 1,000 mg by mouth daily.        No current facility-administered medications for this visit.    Past Medical History  Diagnosis Date  . Hiatal hernia   . Duodenitis   . Renal cyst   . Bell's palsy   . Hypercholesteremia   . Diverticulosis   . Cyst     sebacceous  . Renal  cell carcinoma   . Low back pain   . Hypertension   . Vitamin B12 deficiency   . PVD (peripheral vascular disease)     s/p right iliofem. bypass  . Pancreatitis   . GERD (gastroesophageal reflux disease)   . COPD (chronic obstructive pulmonary disease)   . Colon polyps   . Coronary artery disease     a. s/p INF STEMI 4/12: DES x 2 (mid and dist RCA);  b. cath 4/12: oLAD 40%, mLAD 40%, pD1 30%, pOM1 40%, pOM3 50%, mRCA 80% (PCI), PDA 70% (med tx), PLB1 60%  (med tx); EF 55%  . Meralgia paresthetica     right  . Erectile dysfunction   . OSA (obstructive sleep apnea)     PSG 03/02/11>>AHI 28.6, SpO2 low 88%, CPAP 10 cm H2O.  . 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 Myocardial infarction   . Shortness of breath     Past Surgical History  Procedure Laterality Date  . Bypass graft      Rt. iliofemoral   . Inguinal hernia repair      Rt.  . Tumor removal      Cryo Rena  . Colonoscopy    . Upper gastrointestinal endoscopy    . Video assisted thoracoscopy  July 2013    Rt upper and Rt lower lobe wedge resections   . Coronary angioplasty  01/2011  . Mass excision Right 09/19/2013    Procedure: Excision back mass x2;  Surgeon: Stark Klein, MD;  Location: [facility];  Service: General;  Laterality: Right;  . Excision of skin tag N/A 09/19/2013    Procedure: EXCISION OF SKIN TAG;  Surgeon: Stark Klein, MD;  Location: [facility];  Service: General;  Laterality: N/A;  . Femoral bypass Right ?   2005    Family History  Problem Relation Age of Onset  . Colon cancer Paternal Grandmother   . Cancer Paternal Grandmother     colon  . Hypertension    . Stroke Father   . Hypertension Mother     History   Social History  . Marital Status: Divorced    Spouse Name: N/A    Number of Children: N/A  . Years of Education: N/A   Occupational History  . business Freight forwarder     [employer]   Social History Main Topics  . Smoking status: Current Every Day Smoker -- 0.75 packs/day for 40 years    Types: Cigarettes  . Smokeless tobacco: Never Used  . Alcohol Use: Yes     Comment: occasionally couple of drinks a month  . Drug Use: No  . Sexual Activity: Yes   Other Topics Concern  . Not on file   Social History Narrative  . No narrative on file    Review of Systems:  All systems reviewed.  They are negative to the above problem except as previously stated.  Vital Signs: BP 143/84  Pulse 86  Ht 5\' 10"  (1.778 m)  Wt 260 lb (117.935 kg)  BMI 37.31 kg/m2  Physical Exam Patient is in NAD HEENT:  Normocephalic, atraumatic. EOMI, PERRLA.  Neck: JVP is normal.  No bruits.  Lungs: clear to auscultation. No rales no wheezes.  Heart: Regular rate and rhythm. Normal S1, S2. No S3.   No significant murmurs. PMI not displaced.  Abdomen:  Supple, nontender. Normal bowel sounds. No masses. No hepatomegaly.  Extremities:   Good distal pulses throughout.  No lower extremity edema.  Musculoskeletal :moving all extremities.  Neuro:   alert and oriented x3.  CN II-XII grossly intact.   Assessment and Plan:  1.  CAD Keep on  same regimen  Asymptomatic   2.  HTN  BP is better  Keep on same meds  Plans to continue to lose wt 4.  Tob  Counselled on cessation.  FU next winter.

## 2014-06-22 ENCOUNTER — Other Ambulatory Visit: Payer: Self-pay | Admitting: Internal Medicine

## 2014-06-25 ENCOUNTER — Telehealth: Payer: Self-pay | Admitting: Pulmonary Disease

## 2014-06-25 NOTE — Telephone Encounter (Signed)
Nothing mentioned in last OV about pt needing a CXR before the appt. Pt aware and advised he can have it done at appt if Dr. Halford Chessman feels the need. [place] Bing, CMA

## 2014-07-10 ENCOUNTER — Encounter: Payer: Self-pay | Admitting: Pulmonary Disease

## 2014-07-10 ENCOUNTER — Ambulatory Visit (INDEPENDENT_AMBULATORY_CARE_PROVIDER_SITE_OTHER): Payer: 59 | Admitting: Pulmonary Disease

## 2014-07-10 VITALS — BP 110/62 | HR 85 | Ht 70.0 in | Wt 258.8 lb

## 2014-07-10 DIAGNOSIS — F17200 Nicotine dependence, unspecified, uncomplicated: Secondary | ICD-10-CM

## 2014-07-10 DIAGNOSIS — R05 Cough: Secondary | ICD-10-CM

## 2014-07-10 DIAGNOSIS — J438 Other emphysema: Secondary | ICD-10-CM

## 2014-07-10 DIAGNOSIS — R918 Other nonspecific abnormal finding of lung field: Secondary | ICD-10-CM

## 2014-07-10 DIAGNOSIS — R058 Other specified cough: Secondary | ICD-10-CM

## 2014-07-10 DIAGNOSIS — J439 Emphysema, unspecified: Secondary | ICD-10-CM

## 2014-07-10 DIAGNOSIS — Z9989 Dependence on other enabling machines and devices: Secondary | ICD-10-CM

## 2014-07-10 DIAGNOSIS — R059 Cough, unspecified: Secondary | ICD-10-CM

## 2014-07-10 DIAGNOSIS — J8482 Adult pulmonary Langerhans cell histiocytosis: Secondary | ICD-10-CM

## 2014-07-10 DIAGNOSIS — G4733 Obstructive sleep apnea (adult) (pediatric): Secondary | ICD-10-CM

## 2014-07-10 MED ORDER — TRIAMCINOLONE ACETONIDE 55 MCG/ACT NA AERO: 2.0000 | INHALATION_SPRAY | Freq: Every day | NASAL | Status: AC

## 2014-07-10 NOTE — Progress Notes (Signed)
Chief Complaint  Patient presents with  . Follow-up    Pt CPAP card is not working--unable to download. Wears nightly x 6-8hours. Denies problems with mask and pressure. Wife states that mask leaks. Pt c/o excessive sleepiness during the day.     History of Present Illness: [name] is a 63 y.o. male smoker with COPD, Langerhans histiocytosis, and OSA.  He had CT abdomen while in [place].  He had CT chest in February.    He continues to smoke.  His breathing is about the same.  spiriva helps.  He has noticed more sinus congestion.  His mouth gets dry at night.  He has nasal pillow mask.  He uses CPAP nightly and this helps.  He has been feeling more tired during the day.  He was in hospital in February for chest pain >> likely from reflux.  He got married over the summer.  He is planning to retire next year.  Tests: PSG 03/02/11>>AHI 28.6, SpO2 low 88%, CPAP 10 cm H2O. 05/23/12 Rt VATS bx>>Langerhans cell histiocytosis RUL and RLL nodules CPAP 11/01/11 to 07/31/12>>Used on 223 of 274 nights with average 5 hrs 27 min.  Average AHI 1.3 with CPAP 10 cm H2O. CT chest 08/14/12>>scattered b/l pulmonary nodules no change since 2009, mild centrilobular emphysema PFT 08/30/12>>FEV1 2.64 (81%) , FEV1% 69, TLC 5.52 (83%), DLCO 67%, no BD CT chest 12/20/13 >> changes of emphysema, LUL nodule now 7 mm (was 3 mm), other nodules no change  PMHx, PSHx, Medications, Allergies, Fhx, Shx reviewed.  Physical Exam:  General - No distress ENT - No sinus tenderness, no oral exudate, no LAN, boggy nasal mucosa Cardiac - s1s2 regular, no murmur Chest - no wheezing Back - no focal tenderness Abd - soft, non-tender Ext - no edema Neuro - normal strength Skin - no rashes Psych - normal mood, and behavior   Assessment/Plan:  [name] Mires, MD [facility] Pager:  [phone] 07/10/2014, 3:20 PM

## 2014-07-10 NOTE — Patient Instructions (Signed)
Try using chin strap with CPAP mask Nasal irrigation (salt water nose spray) nightly for one week, then as needed Nasacort two sprays each nostril daily for one week, then as needed Will get copy of CPAP report and call with results Will arrange for CT chest and call with results  Follow up in 6 months

## 2014-07-12 ENCOUNTER — Other Ambulatory Visit: Payer: Self-pay | Admitting: Pulmonary Disease

## 2014-07-13 ENCOUNTER — Encounter: Payer: Self-pay | Admitting: Pulmonary Disease

## 2014-07-13 DIAGNOSIS — R058 Other specified cough: Secondary | ICD-10-CM | POA: Insufficient documentation

## 2014-07-13 DIAGNOSIS — R05 Cough: Secondary | ICD-10-CM | POA: Insufficient documentation

## 2014-07-13 NOTE — Assessment & Plan Note (Signed)
Related to post-nasal drip.  He can use nasal irrigation and nasacort.

## 2014-07-13 NOTE — Assessment & Plan Note (Signed)
He is compliant with CPAP and has benefit.  He has noticed more daytime sleepiness recently.  Advised him to try using chin strap to prevent mouth leak.  Will call him with results of CPAP download.

## 2014-07-13 NOTE — Assessment & Plan Note (Signed)
Again reviewed importance of smoking cessation.  He says he is getting more serious about quitting.  Offered assistance with this.

## 2014-07-13 NOTE — Assessment & Plan Note (Signed)
He was noted to have pulmonary nodules on CT chest in February 2015.  Will arrange for follow up CT chest w/o contrast and call him with results.

## 2014-07-13 NOTE — Assessment & Plan Note (Addendum)
Stable on spiriva 

## 2014-07-17 ENCOUNTER — Ambulatory Visit (INDEPENDENT_AMBULATORY_CARE_PROVIDER_SITE_OTHER)
Admission: RE | Admit: 2014-07-17 | Discharge: 2014-07-17 | Disposition: A | Discharge status: Home / Self Care | Payer: 59 | Source: Ambulatory Visit | Attending: Pulmonary Disease | Admitting: Pulmonary Disease

## 2014-07-17 DIAGNOSIS — R918 Other nonspecific abnormal finding of lung field: Secondary | ICD-10-CM

## 2014-07-17 DIAGNOSIS — J8482 Adult pulmonary Langerhans cell histiocytosis: Secondary | ICD-10-CM

## 2014-07-21 ENCOUNTER — Other Ambulatory Visit: Payer: Self-pay | Admitting: Pulmonary Disease

## 2014-07-21 MED ORDER — TIOTROPIUM BROMIDE MONOHYDRATE 18 MCG IN CAPS: ORAL_CAPSULE | RESPIRATORY_TRACT | Status: DC

## 2014-07-22 ENCOUNTER — Telehealth: Payer: Self-pay | Admitting: Pulmonary Disease

## 2014-07-22 NOTE — Telephone Encounter (Signed)
CT chest 07/17/14 >> LUL nodule now 5 mm, multiple b/l 1-2 mm nodules stable, new 4 mm nodule RML, 6 mm nodule RLL, Rt pleural plaque.   Left message on pt's voicemail explaining that his CT chest shows multiple relatively stable, small pulmonary nodules.  He does appear to have a few new, small nodules.  He will need f/u CT chest w/o contrast in March 2015.  Advised he could call back with questions.

## 2014-07-25 ENCOUNTER — Ambulatory Visit: Payer: 59 | Admitting: Internal Medicine

## 2014-08-15 ENCOUNTER — Other Ambulatory Visit: Payer: Self-pay

## 2014-08-18 ENCOUNTER — Other Ambulatory Visit: Payer: Self-pay | Admitting: Internal Medicine

## 2014-08-28 ENCOUNTER — Encounter: Payer: Self-pay | Admitting: Internal Medicine

## 2014-08-28 ENCOUNTER — Ambulatory Visit (INDEPENDENT_AMBULATORY_CARE_PROVIDER_SITE_OTHER): Payer: 59 | Admitting: Internal Medicine

## 2014-08-28 VITALS — BP 118/60 | HR 87 | Temp 98.8°F | Wt 266.0 lb

## 2014-08-28 DIAGNOSIS — I2510 Atherosclerotic heart disease of native coronary artery without angina pectoris: Secondary | ICD-10-CM

## 2014-08-28 DIAGNOSIS — E538 Deficiency of other specified B group vitamins: Secondary | ICD-10-CM

## 2014-08-28 DIAGNOSIS — F411 Generalized anxiety disorder: Secondary | ICD-10-CM

## 2014-08-28 MED ORDER — VARDENAFIL HCL 20 MG PO TABS: 20.0000 mg | ORAL_TABLET | Freq: Every day | ORAL | Status: AC | PRN

## 2014-08-28 NOTE — Assessment & Plan Note (Signed)
Continue with current prescription therapy as reflected on the Med list.  

## 2014-08-28 NOTE — Progress Notes (Signed)
   Subjective:     HPI    C/o fatigue, wt gain. He got married. Planning to live in [place] and winters in [place]  The patient presents for a follow-up of  chronic hypertension, chronic dyslipidemia, PVD, CAD controlled with medicines. He started Tricor. He doesn't take  Lipitor. F/u ED, PVD. F/u bruising - resolved. F/u B IT pain. C/o fatigue from Metoprolol or other meds. C/o HAs  Wt Readings from Last 3 Encounters:  08/28/14 266 lb (120.657 kg)  07/10/14 258 lb 12.8 oz (117.391 kg)  06/02/14 260 lb (117.935 kg)   BP Readings from Last 3 Encounters:  08/28/14 118/60  07/10/14 110/62  06/02/14 143/84     Review of Systems  Constitutional: Negative for appetite change, fatigue and unexpected weight change.  HENT: Negative for congestion, nosebleeds, sneezing, sore throat and trouble swallowing.   Eyes: Negative for itching and visual disturbance.  Respiratory: Positive for cough.   Cardiovascular: Negative for palpitations and leg swelling.  Gastrointestinal: Negative for nausea, diarrhea, blood in stool and abdominal distention.  Genitourinary: Negative for frequency and hematuria.  Musculoskeletal: Negative for back pain, gait problem, joint swelling and neck pain.  Skin: Negative for rash.  Neurological: Negative for dizziness, tremors, speech difficulty and weakness.  Psychiatric/Behavioral: Negative for suicidal ideas, sleep disturbance, dysphoric mood and agitation. The patient is not nervous/anxious.        Objective:   Physical Exam  Constitutional: He is oriented to person, place, and time. He appears well-developed.  Obese  HENT:  Mouth/Throat: Oropharynx is clear and moist.  Eyes: Conjunctivae are normal. Pupils are equal, round, and reactive to light.  Neck: Normal range of motion. No JVD present. No thyromegaly present.  Cardiovascular: Normal rate, regular rhythm, normal heart sounds and intact distal pulses.  Exam reveals no gallop and no friction rub.    No murmur heard. Pulmonary/Chest: Effort normal and breath sounds normal. No respiratory distress. He has no wheezes. He has no rales. He exhibits no tenderness.  Abdominal: Soft. Bowel sounds are normal. He exhibits no distension and no mass. There is no tenderness. There is no rebound and no guarding.  Musculoskeletal: Normal range of motion. He exhibits no edema and no tenderness.  R lat foot oval ellastic mass 2x1.5 cm  Lymphadenopathy:    He has no cervical adenopathy.  Neurological: He is alert and oriented to person, place, and time. He has normal reflexes. No cranial nerve deficit. He exhibits normal muscle tone. Coordination normal.  Skin: Skin is warm and dry. No rash noted.  Psychiatric: He has a normal mood and affect. His behavior is normal. Judgment and thought content normal.  Lipomas - chest eryth throat  Lab Results  Component Value Date   WBC 9.8 01/08/2014   HGB 16.3 01/08/2014   HCT 47.3 01/08/2014   PLT 223.0 01/08/2014   GLUCOSE 97 06/02/2014   CHOL 156 12/20/2013   TRIG 354* 12/20/2013   HDL 17* 12/20/2013   LDLDIRECT 111.7 08/07/2013   LDLCALC 68 12/20/2013   ALT 16 12/20/2013   AST 15 12/20/2013   NA 133* 06/02/2014   K 4.0 06/02/2014   CL 102 06/02/2014   CREATININE 1.0 06/02/2014   BUN 21 06/02/2014   CO2 23 06/02/2014   TSH 1.91 01/08/2014   PSA 0.48 11/03/2010   INR 0.97 12/20/2013   HGBA1C 5.5 12/20/2013        Assessment & Plan:

## 2014-08-28 NOTE — Progress Notes (Deleted)
Pre visit review using our clinic review tool, if applicable. No additional management support is needed unless otherwise documented below in the visit note. 

## 2014-09-08 ENCOUNTER — Emergency Department (HOSPITAL_COMMUNITY): Payer: 59

## 2014-09-08 ENCOUNTER — Ambulatory Visit (INDEPENDENT_AMBULATORY_CARE_PROVIDER_SITE_OTHER): Payer: 59 | Admitting: Internal Medicine

## 2014-09-08 ENCOUNTER — Emergency Department (HOSPITAL_COMMUNITY)
Admission: EM | Admit: 2014-09-08 | Discharge: 2014-09-08 | Disposition: A | Discharge status: Home / Self Care | Payer: 59 | Attending: Emergency Medicine | Admitting: Emergency Medicine

## 2014-09-08 ENCOUNTER — Encounter (HOSPITAL_COMMUNITY): Payer: Self-pay

## 2014-09-08 ENCOUNTER — Encounter: Payer: Self-pay | Admitting: Internal Medicine

## 2014-09-08 ENCOUNTER — Other Ambulatory Visit (INDEPENDENT_AMBULATORY_CARE_PROVIDER_SITE_OTHER): Payer: 59

## 2014-09-08 VITALS — BP 140/78 | HR 63 | Temp 98.2°F | Ht 70.0 in | Wt 267.0 lb

## 2014-09-08 DIAGNOSIS — Z86010 Personal history of colonic polyps: Secondary | ICD-10-CM | POA: Diagnosis not present

## 2014-09-08 DIAGNOSIS — E538 Deficiency of other specified B group vitamins: Secondary | ICD-10-CM

## 2014-09-08 DIAGNOSIS — Z951 Presence of aortocoronary bypass graft: Secondary | ICD-10-CM | POA: Insufficient documentation

## 2014-09-08 DIAGNOSIS — R0789 Other chest pain: Secondary | ICD-10-CM | POA: Insufficient documentation

## 2014-09-08 DIAGNOSIS — K219 Gastro-esophageal reflux disease without esophagitis: Secondary | ICD-10-CM | POA: Diagnosis not present

## 2014-09-08 DIAGNOSIS — J449 Chronic obstructive pulmonary disease, unspecified: Secondary | ICD-10-CM | POA: Insufficient documentation

## 2014-09-08 DIAGNOSIS — I739 Peripheral vascular disease, unspecified: Secondary | ICD-10-CM | POA: Insufficient documentation

## 2014-09-08 DIAGNOSIS — I10 Essential (primary) hypertension: Secondary | ICD-10-CM

## 2014-09-08 DIAGNOSIS — Z9981 Dependence on supplemental oxygen: Secondary | ICD-10-CM | POA: Diagnosis not present

## 2014-09-08 DIAGNOSIS — Z9861 Coronary angioplasty status: Secondary | ICD-10-CM | POA: Diagnosis not present

## 2014-09-08 DIAGNOSIS — E780 Pure hypercholesterolemia: Secondary | ICD-10-CM | POA: Insufficient documentation

## 2014-09-08 DIAGNOSIS — G4733 Obstructive sleep apnea (adult) (pediatric): Secondary | ICD-10-CM | POA: Insufficient documentation

## 2014-09-08 DIAGNOSIS — R600 Localized edema: Secondary | ICD-10-CM | POA: Insufficient documentation

## 2014-09-08 DIAGNOSIS — N529 Male erectile dysfunction, unspecified: Secondary | ICD-10-CM | POA: Insufficient documentation

## 2014-09-08 DIAGNOSIS — I25118 Atherosclerotic heart disease of native coronary artery with other forms of angina pectoris: Secondary | ICD-10-CM

## 2014-09-08 DIAGNOSIS — E0501 Thyrotoxicosis with diffuse goiter with thyrotoxic crisis or storm: Secondary | ICD-10-CM

## 2014-09-08 DIAGNOSIS — Z720 Tobacco use: Secondary | ICD-10-CM | POA: Diagnosis not present

## 2014-09-08 DIAGNOSIS — I2510 Atherosclerotic heart disease of native coronary artery without angina pectoris: Secondary | ICD-10-CM | POA: Diagnosis not present

## 2014-09-08 DIAGNOSIS — R079 Chest pain, unspecified: Secondary | ICD-10-CM | POA: Diagnosis present

## 2014-09-08 DIAGNOSIS — Z7951 Long term (current) use of inhaled steroids: Secondary | ICD-10-CM | POA: Insufficient documentation

## 2014-09-08 DIAGNOSIS — I252 Old myocardial infarction: Secondary | ICD-10-CM | POA: Diagnosis not present

## 2014-09-08 DIAGNOSIS — Z79899 Other long term (current) drug therapy: Secondary | ICD-10-CM | POA: Diagnosis not present

## 2014-09-08 DIAGNOSIS — Z85528 Personal history of other malignant neoplasm of kidney: Secondary | ICD-10-CM | POA: Diagnosis not present

## 2014-09-08 DIAGNOSIS — Z7982 Long term (current) use of aspirin: Secondary | ICD-10-CM | POA: Diagnosis not present

## 2014-09-08 DIAGNOSIS — R1013 Epigastric pain: Secondary | ICD-10-CM | POA: Insufficient documentation

## 2014-09-08 DIAGNOSIS — I779 Disorder of arteries and arterioles, unspecified: Secondary | ICD-10-CM

## 2014-09-08 DIAGNOSIS — Z7902 Long term (current) use of antithrombotics/antiplatelets: Secondary | ICD-10-CM | POA: Insufficient documentation

## 2014-09-08 LAB — AMYLASE: Amylase: 56 U/L (ref 27–131)

## 2014-09-08 LAB — LIPASE: Lipase: 33 U/L (ref 11.0–59.0)

## 2014-09-08 LAB — CARDIAC PANEL
CK TOTAL: 449 U/L — AB (ref 7–232)
CK-MB: 4.6 ng/mL — ABNORMAL HIGH (ref 0.3–4.0)
Relative Index: 1 calc (ref 0.0–2.5)

## 2014-09-08 LAB — CBC WITH DIFFERENTIAL/PLATELET
BASOS PCT: 0.4 % (ref 0.0–3.0)
Basophils Absolute: 0.1 10*3/uL (ref 0.0–0.1)
Eosinophils Absolute: 0.2 10*3/uL (ref 0.0–0.7)
Eosinophils Relative: 1.9 % (ref 0.0–5.0)
HEMATOCRIT: 49.2 % (ref 39.0–52.0)
Hemoglobin: 16.4 g/dL (ref 13.0–17.0)
LYMPHS ABS: 3.8 10*3/uL (ref 0.7–4.0)
Lymphocytes Relative: 31.7 % (ref 12.0–46.0)
MCHC: 33.3 g/dL (ref 30.0–36.0)
MCV: 92.7 fl (ref 78.0–100.0)
MONO ABS: 0.9 10*3/uL (ref 0.1–1.0)
Monocytes Relative: 7.3 % (ref 3.0–12.0)
Neutro Abs: 7 10*3/uL (ref 1.4–7.7)
Neutrophils Relative %: 58.7 % (ref 43.0–77.0)
PLATELETS: 215 10*3/uL (ref 150.0–400.0)
RBC: 5.31 Mil/uL (ref 4.22–5.81)
RDW: 13.8 % (ref 11.5–15.5)
WBC: 12 10*3/uL — AB (ref 4.0–10.5)

## 2014-09-08 LAB — I-STAT TROPONIN, ED: Troponin i, poc: 0.01 ng/mL (ref 0.00–0.08)

## 2014-09-08 LAB — COMPREHENSIVE METABOLIC PANEL
ALK PHOS: 79 U/L (ref 39–117)
ALT: 21 U/L (ref 0–53)
AST: 22 U/L (ref 0–37)
Albumin: 3.5 g/dL (ref 3.5–5.2)
BILIRUBIN TOTAL: 0.9 mg/dL (ref 0.2–1.2)
BUN: 16 mg/dL (ref 6–23)
CO2: 26 mEq/L (ref 19–32)
Calcium: 9.6 mg/dL (ref 8.4–10.5)
Chloride: 105 mEq/L (ref 96–112)
Creatinine, Ser: 1.1 mg/dL (ref 0.4–1.5)
GFR: 74.35 mL/min (ref >60.00)
Glucose, Bld: 88 mg/dL (ref 70–99)
Potassium: 3.9 mEq/L (ref 3.5–5.1)
SODIUM: 139 meq/L (ref 135–145)
TOTAL PROTEIN: 7.2 g/dL (ref 6.0–8.3)

## 2014-09-08 LAB — TROPONIN I: TROPONIN I: 0.02 ng/mL (ref <0.06)

## 2014-09-08 LAB — BRAIN NATRIURETIC PEPTIDE: PRO B NATRI PEPTIDE: 175 pg/mL — AB (ref 0.0–100.0)

## 2014-09-08 LAB — TSH: TSH: 2.15 u[IU]/mL (ref 0.35–4.50)

## 2014-09-08 NOTE — Progress Notes (Signed)
Pre visit review using our clinic review tool, if applicable. No additional management support is needed unless otherwise documented below in the visit note. 

## 2014-09-08 NOTE — Discharge Instructions (Signed)

## 2014-09-08 NOTE — Patient Instructions (Signed)

## 2014-09-08 NOTE — Assessment & Plan Note (Signed)
This appears to have resolved His TSH is in the normal range today

## 2014-09-08 NOTE — Progress Notes (Signed)
Subjective:    Patient ID: [name], male    DOB: [birth year], 63 y.o.   MRN: [id number]  Abdominal Pain This is a recurrent problem. The current episode started in the past 7 days. The onset quality is gradual. The problem occurs intermittently. The most recent episode lasted 7 days. The problem has been unchanged. Pain location: epigastric, lower sternum, RUQ. The pain is at a severity of 2/10. The pain is mild. Quality: "intermittent fluttering and twitching" The abdominal pain does not radiate. Associated symptoms include nausea. Pertinent negatives include no anorexia, arthralgias, belching, constipation, diarrhea, dysuria, fever, flatus, frequency, headaches, hematochezia, hematuria, melena, myalgias, vomiting or weight loss. Nothing aggravates the pain. The pain is relieved by nothing. He has tried nothing for the symptoms. The treatment provided no relief.      Review of Systems  Constitutional: Negative.  Negative for fever, chills, weight loss, diaphoresis, appetite change and fatigue.  HENT: Negative.   Eyes: Negative.   Respiratory: Negative.  Negative for cough, choking, chest tightness, shortness of breath and stridor.   Cardiovascular: Negative.  Negative for chest pain, palpitations and leg swelling.  Gastrointestinal: Positive for nausea and abdominal pain. Negative for vomiting, diarrhea, constipation, blood in stool, melena, hematochezia, abdominal distention, anal bleeding, anorexia and flatus.  Endocrine: Negative.   Genitourinary: Negative.  Negative for dysuria, frequency and hematuria.  Musculoskeletal: Negative.  Negative for myalgias and arthralgias.  Skin: Negative.   Allergic/Immunologic: Negative.   Neurological: Negative.  Negative for dizziness, syncope, speech difficulty, weakness, numbness and headaches.  Hematological: Negative.  Negative for adenopathy. Does not bruise/bleed easily.  Psychiatric/Behavioral: Negative.        Objective:   Physical Exam  Constitutional: He is oriented to person, place, and time. He appears well-developed and well-nourished.  Non-toxic appearance. He does not have a sickly appearance. He does not appear ill. No distress.  HENT:  Head: Normocephalic and atraumatic.  Mouth/Throat: Oropharynx is clear and moist. No oropharyngeal exudate.  Eyes: Conjunctivae are normal. Right eye exhibits no discharge. Left eye exhibits no discharge. No scleral icterus.  Neck: Normal range of motion. Neck supple. No JVD present. No tracheal deviation present. No thyromegaly present.  Cardiovascular: Normal rate, regular rhythm, S1 normal, S2 normal, normal heart sounds and intact distal pulses.  Exam reveals no gallop, no S3, no S4 and no friction rub.   No murmur heard. Pulmonary/Chest: Effort normal and breath sounds normal. No stridor. No respiratory distress. He has no wheezes. He has no rales. He exhibits no tenderness.  Abdominal: Soft. Bowel sounds are normal. He exhibits no distension and no mass. There is no tenderness. There is no rebound and no guarding.  Musculoskeletal: Normal range of motion. He exhibits edema (trace edema in BLE). He exhibits no tenderness.  Lymphadenopathy:    He has no cervical adenopathy.  Neurological: He is oriented to person, place, and time.  Skin: Skin is warm and dry. No rash noted. He is not diaphoretic. No erythema. No pallor.  Vitals reviewed.    Lab Results  Component Value Date   WBC 9.8 01/08/2014   HGB 16.3 01/08/2014   HCT 47.3 01/08/2014   PLT 223.0 01/08/2014   GLUCOSE 97 06/02/2014   CHOL 156 12/20/2013   TRIG 354* 12/20/2013   HDL 17* 12/20/2013   LDLDIRECT 111.7 08/07/2013   LDLCALC 68 12/20/2013   ALT 16 12/20/2013   AST 15 12/20/2013   NA 133* 06/02/2014   K 4.0 06/02/2014  CL 102 06/02/2014   CREATININE 1.0 06/02/2014   BUN 21 06/02/2014   CO2 23 06/02/2014   TSH 1.91 01/08/2014   PSA 0.48 11/03/2010   INR 0.97 12/20/2013   HGBA1C 5.5  12/20/2013       Assessment & Plan:

## 2014-09-08 NOTE — ED Provider Notes (Signed)
CSN: [id number]     Arrival date & time 09/08/14  1648 History   First MD Initiated Contact with Patient 09/08/14 1700     Chief Complaint  Patient presents with  . Chest Pain     (Consider location/radiation/quality/duration/timing/severity/associated sxs/prior Treatment) Patient is a 63 y.o. male presenting with chest pain. The history is provided by the patient.  Chest Pain Pain location:  R chest Pain quality comment:  Sharp, muscle twitches or cramps Pain radiates to:  Does not radiate Pain radiates to the back: no   Pain severity:  Moderate Onset quality:  Sudden Duration:  1 week Timing:  Intermittent Progression:  Unchanged (these twitches occur for about 1-2 sec and resolve.  sometimes have multiple at a time and then sometimes going for hours) Chronicity:  New Context comment:  Has been doing a lot of lifting lately but he is unable to reproduce these sx Relieved by:  Nothing Worsened by:  Nothing tried Ineffective treatments:  None tried Associated symptoms: no abdominal pain, no anorexia, no back pain, no cough, no fever, no heartburn, no nausea, no palpitations, no shortness of breath, no syncope, not vomiting and no weakness   Risk factors: coronary artery disease and hypertension     Past Medical History  Diagnosis Date  . Hiatal hernia   . Duodenitis   . Renal cyst   . Bell's palsy   . Hypercholesteremia   . Diverticulosis   . Cyst     sebacceous  . Renal cell carcinoma   . Low back pain   . Hypertension   . Vitamin B12 deficiency   . PVD (peripheral vascular disease)     s/p right iliofem. bypass  . Pancreatitis   . GERD (gastroesophageal reflux disease)   . COPD (chronic obstructive pulmonary disease)   . Colon polyps   . Coronary artery disease     a. s/p INF STEMI 4/12: DES x 2 (mid and dist RCA);  b. cath 4/12: oLAD 40%, mLAD 40%, pD1 30%, pOM1 40%, pOM3 50%, mRCA 80% (PCI), PDA 70% (med tx), PLB1 60%  (med tx); EF 55%  . Meralgia paresthetica      right  . Erectile dysfunction   . OSA (obstructive sleep apnea)     PSG 03/02/11>>AHI 28.6, SpO2 low 88%, CPAP 10 cm H2O.  . 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 Myocardial infarction   . Shortness of breath    Past Surgical History  Procedure Laterality Date  . Bypass graft      Rt. iliofemoral   . Inguinal hernia repair      Rt.  . Tumor removal      Cryo Rena  . Colonoscopy    . Upper gastrointestinal endoscopy    . Video assisted thoracoscopy  July 2013    Rt upper and Rt lower lobe wedge resections  . Coronary angioplasty  01/2011  . Mass excision Right 09/19/2013    Procedure: Excision back mass x2;  Surgeon: Stark Klein, MD;  Location: [facility];  Service: General;  Laterality: Right;  . Excision of skin tag N/A 09/19/2013    Procedure: EXCISION OF SKIN TAG;  Surgeon: Stark Klein, MD;  Location: [facility];  Service: General;  Laterality: N/A;  . Femoral bypass Right ?   2005   Family History  Problem Relation Age of Onset  . Colon cancer Paternal Grandmother   . Cancer Paternal Grandmother  colon  . Hypertension    . Stroke Father   . Hypertension Mother    History  Substance Use Topics  . Smoking status: Current Every Day Smoker -- 0.75 packs/day for 40 years    Types: Cigarettes  . Smokeless tobacco: Never Used  . Alcohol Use: Yes     Comment: occasionally couple of drinks a month    Review of Systems  Constitutional: Negative for fever.  Respiratory: Negative for cough and shortness of breath.   Cardiovascular: Positive for chest pain. Negative for palpitations and syncope.  Gastrointestinal: Negative for heartburn, nausea, vomiting, abdominal pain and anorexia.  Musculoskeletal: Negative for back pain.  Neurological: Negative for weakness.  All other systems reviewed and are negative.     Allergies  Pravastatin sodium; Rosuvastatin; Simvastatin; and Statins  Home Medications   Prior to Admission  medications   Medication Sig Start Date End Date Taking? Authorizing Provider  aspirin 81 MG tablet Take 81 mg by mouth every morning.    Yes Historical Provider, MD  Azilsartan Medoxomil (EDARBI) 80 MG TABS Take 1 tablet (80 mg total) by mouth daily. Patient taking differently: Take 1 tablet by mouth every morning.  09/17/13  Yes Aleksei Plotnikov V, MD  Cholecalciferol (VITAMIN D3) 1000 UNITS CAPS Take 1,000 Units by mouth daily.    Yes Historical Provider, MD  clopidogrel (PLAVIX) 75 MG tablet Take 75 mg by mouth every morning.   Yes Historical Provider, MD  Coenzyme Q10 (CO Q-10) 200 MG CAPS Take 200 mg by mouth daily.    Yes Historical Provider, MD  ezetimibe (ZETIA) 10 MG tablet Take 1 tablet (10 mg total) by mouth daily. 03/21/14  Yes Fay Records, MD  ibuprofen (ADVIL,MOTRIN) 200 MG tablet Take 400 mg by mouth every 4 (four) hours as needed.   Yes Historical Provider, MD  pantoprazole (PROTONIX) 40 MG tablet Take 1 tablet (40 mg total) by mouth daily. 03/21/14  Yes Fay Records, MD  sucralfate (CARAFATE) 1 G tablet Take 1 tablet (1 g total) by mouth 4 (four) times daily -  with meals and at bedtime. Patient taking differently: Take 1 g by mouth at bedtime.  01/10/14  Yes Aleksei Plotnikov V, MD  tiotropium (SPIRIVA) 18 MCG inhalation capsule Place 18 mcg into inhaler and inhale daily.   Yes Historical Provider, MD  traMADol (ULTRAM) 50 MG tablet Take 50-100 mg by mouth every 6 (six) hours as needed for moderate pain (when golfing).   Yes Historical Provider, MD  triamcinolone (NASACORT AQ) 55 MCG/ACT AERO nasal inhaler Place 2 sprays into the nose daily. 07/10/14  Yes Chesley Mires, MD  triamterene-hydrochlorothiazide (MAXZIDE-25) 37.5-25 MG per tablet Take 0.5 tablets by mouth daily. 03/21/14  Yes Fay Records, MD  vardenafil (LEVITRA) 20 MG tablet Take 1 tablet (20 mg total) by mouth daily as needed for erectile dysfunction. 08/28/14  Yes Aleksei Plotnikov V, MD  vitamin B-12 (CYANOCOBALAMIN)  1000 MCG tablet Take 1,000 mcg by mouth daily.    Yes Historical Provider, MD  vitamin C (ASCORBIC ACID) 500 MG tablet Take 1,000 mg by mouth daily.    Yes Historical Provider, MD   BP 111/91 mmHg  Pulse 81  Temp(Src) 98.2 F (36.8 C)  Resp 20  SpO2 97% Physical Exam  Constitutional: He is oriented to person, place, and time. He appears well-developed and well-nourished. No distress.  HENT:  Head: Normocephalic and atraumatic.  Mouth/Throat: Oropharynx is clear and moist.  Eyes: Conjunctivae and EOM  are normal. Pupils are equal, round, and reactive to light.  Neck: Normal range of motion. Neck supple.  Cardiovascular: Normal rate, regular rhythm and intact distal pulses.   No murmur heard. Pulmonary/Chest: Effort normal and breath sounds normal. No respiratory distress. He has no wheezes. He has no rales. He exhibits no tenderness.  Abdominal: Soft. He exhibits no distension. There is tenderness in the epigastric area. There is no rebound and no guarding.  Minimal epigastric tenderness  Musculoskeletal: Normal range of motion. He exhibits edema. He exhibits no tenderness.  Trace bilateral edema in lower ext  Neurological: He is alert and oriented to person, place, and time.  Skin: Skin is warm and dry. No rash noted. No erythema.  Psychiatric: He has a normal mood and affect. His behavior is normal.  Nursing note and vitals reviewed.   ED Course  Procedures (including critical care time) Labs Review Labs Reviewed  Randolm Idol, ED    Imaging Review Dg Chest 2 View  09/08/2014   CLINICAL DATA:  Right-sided chest wall pain.  EXAM: CHEST  2 VIEW  COMPARISON:  12/19/2013  FINDINGS: There is interstitial coarsening which is similar to slightly increased from priors. No discrete pneumonia. Apparent ill-defined opacity overlapping the heart in the lateral projection is not confirmed frontally. No edema, effusion, or pneumothorax. Remote surgery to the right upper lobe. Normal heart  size. Stable aortic and hilar contours. No osseous findings to explain chest pain.  IMPRESSION: Chronic bronchitic changes without acute superimposed finding.   Electronically Signed   By: Jorje Guild M.D.   On: 09/08/2014 18:27     EKG Interpretation   Date/Time:  Monday September 08 2014 16:55:27 EST Ventricular Rate:  87 PR Interval:  160 QRS Duration: 106 QT Interval:  392 QTC Calculation: 471 R Axis:   12 Text Interpretation:  Sinus rhythm with occasional Premature ventricular  complexes and Premature atrial complexes Inferior infarct , age  undetermined Anterolateral infarct , age undetermined No significant  change since last tracing Confirmed by [facility]  MD, Loree Fee ([id number]) on  09/08/2014 5:00:34 PM      MDM   Final diagnoses:  Right-sided chest wall pain    Patient sent from his doctor's office for an EKG and elevated CK. Patient states for the last 1 week he's had intermittent spasms in the right lower chest and right upper abdomen. They only occur for a second and resolved. They've been intermittent. Patient denies shortness of breath, chest pain, nausea, vomiting, abdominal pain. She denies any association with eating or exertion.  Patient is well-appearing and unable to reproduce pain on exam here. Vital signs are within normal limits. Patient had a CMP, CBC, TSH, lipase done at the office which were all within normal limits except for a nonspecific leukocytosis of 12,000. Patient had a CK of 499 and mild elevation of CK-MB. However troponin did not result.  We'll check chest x-ray and troponin and if these are within normal limits we'll discharge patient home. Of note he has been doing a lot of packing and heavy lifting over the last week as they are getting ready to move in the spring. This could be musculoskeletal in nature  7:15 PM Trop and CXR neg.  Will d/c pt home.  Blanchie Dessert, MD 09/08/14 [phone]

## 2014-09-08 NOTE — ED Notes (Addendum)
Pt. Coming from [facility] for elevated enzymes. Reports intermittent chest palpitations x1 week. States lasts approx 5 sec, approx 10-12 times a day. Reports afterward he becomes nausea with "hot flash". Hx of MI and stent placement

## 2014-09-08 NOTE — Assessment & Plan Note (Signed)
He presented today with atypical right lower chest and epigastric pain, I was not able to get an EKG done as our EKG machines were not functioning properly. His BNP and CK-MB are slightly so I will refer him to the ER for further evaluation

## 2014-09-08 NOTE — Assessment & Plan Note (Signed)
He has a history of fatty liver and I believe this is causing his pain. I will check his labs to look for other causes.

## 2014-09-29 ENCOUNTER — Other Ambulatory Visit: Payer: Self-pay | Admitting: Internal Medicine

## 2014-10-02 ENCOUNTER — Telehealth: Payer: Self-pay

## 2014-10-02 ENCOUNTER — Other Ambulatory Visit: Payer: Self-pay

## 2014-10-02 MED ORDER — AZILSARTAN MEDOXOMIL 80 MG PO TABS: 1.0000 | ORAL_TABLET | Freq: Every day | ORAL | Status: DC

## 2014-10-02 NOTE — Telephone Encounter (Signed)
PA for Edarbi started via cover my meds.

## 2014-10-06 ENCOUNTER — Telehealth: Payer: Self-pay | Admitting: Internal Medicine

## 2014-10-06 NOTE — Telephone Encounter (Signed)
Pt needing BP med, Earnest Rosier may have been denied (PA), pt has been out of BP med for over a week. Can a generic or something be called in? [facility]. Pt leaving town Thursday, pls let pt know. [phone]

## 2014-10-06 NOTE — Telephone Encounter (Signed)
Is generic Micardis 80 mg covered? Thx

## 2014-10-07 MED ORDER — TELMISARTAN 80 MG PO TABS: 80.0000 mg | ORAL_TABLET | Freq: Every day | ORAL | Status: AC

## 2014-10-07 NOTE — Telephone Encounter (Signed)
Called pharmacy spoke with pharmacist they stated they couldn't tell what the insurance will cover. MD will have to just send prescription of what generic BP he is wanting to rx to see if its covered...Johny Chess

## 2014-10-07 NOTE — Telephone Encounter (Signed)
Notified pt with md response.../lmb 

## 2014-10-07 NOTE — Telephone Encounter (Signed)
OK micardis 80 qd - emailed Thx

## 2014-11-28 ENCOUNTER — Ambulatory Visit: Payer: 59 | Admitting: Internal Medicine

## 2015-01-19 ENCOUNTER — Telehealth: Payer: Self-pay | Admitting: Pulmonary Disease

## 2015-01-19 DIAGNOSIS — G4733 Obstructive sleep apnea (adult) (pediatric): Secondary | ICD-10-CM

## 2015-01-19 NOTE — Telephone Encounter (Signed)
Called pt and aware RX has been placed to [facility]. Nothing further needed

## 2015-01-20 ENCOUNTER — Ambulatory Visit (INDEPENDENT_AMBULATORY_CARE_PROVIDER_SITE_OTHER): Payer: 59 | Admitting: Internal Medicine

## 2015-01-20 ENCOUNTER — Encounter: Payer: Self-pay | Admitting: Internal Medicine

## 2015-01-20 ENCOUNTER — Other Ambulatory Visit (INDEPENDENT_AMBULATORY_CARE_PROVIDER_SITE_OTHER): Payer: 59

## 2015-01-20 VITALS — BP 138/70 | HR 76 | Wt 277.0 lb

## 2015-01-20 DIAGNOSIS — E538 Deficiency of other specified B group vitamins: Secondary | ICD-10-CM

## 2015-01-20 DIAGNOSIS — H538 Other visual disturbances: Secondary | ICD-10-CM

## 2015-01-20 LAB — CBC WITH DIFFERENTIAL/PLATELET
BASOS PCT: 0.8 % (ref 0.0–3.0)
Basophils Absolute: 0.1 10*3/uL (ref 0.0–0.1)
Eosinophils Absolute: 0.2 10*3/uL (ref 0.0–0.7)
Eosinophils Relative: 2.2 % (ref 0.0–5.0)
HCT: 46.4 % (ref 39.0–52.0)
Hemoglobin: 15.9 g/dL (ref 13.0–17.0)
LYMPHS ABS: 3.3 10*3/uL (ref 0.7–4.0)
LYMPHS PCT: 36.3 % (ref 12.0–46.0)
MCHC: 34.2 g/dL (ref 30.0–36.0)
MCV: 91 fl (ref 78.0–100.0)
MONOS PCT: 7.9 % (ref 3.0–12.0)
Monocytes Absolute: 0.7 10*3/uL (ref 0.1–1.0)
NEUTROS ABS: 4.9 10*3/uL (ref 1.4–7.7)
Neutrophils Relative %: 52.8 % (ref 43.0–77.0)
Platelets: 213 10*3/uL (ref 150.0–400.0)
RBC: 5.1 Mil/uL (ref 4.22–5.81)
RDW: 13.9 % (ref 11.5–15.5)
WBC: 9.2 10*3/uL (ref 4.0–10.5)

## 2015-01-20 LAB — HEPATIC FUNCTION PANEL
ALK PHOS: 80 U/L (ref 39–117)
ALT: 27 U/L (ref 0–53)
AST: 22 U/L (ref 0–37)
Albumin: 3.9 g/dL (ref 3.5–5.2)
BILIRUBIN DIRECT: 0.1 mg/dL (ref 0.0–0.3)
Total Bilirubin: 0.7 mg/dL (ref 0.2–1.2)
Total Protein: 6.9 g/dL (ref 6.0–8.3)

## 2015-01-20 LAB — HEMOGLOBIN A1C: HEMOGLOBIN A1C: 5.9 % (ref 4.6–6.5)

## 2015-01-20 LAB — T4, FREE: Free T4: 0.73 ng/dL (ref 0.60–1.60)

## 2015-01-20 LAB — SEDIMENTATION RATE: Sed Rate: 11 mm/hr (ref 0–22)

## 2015-01-20 LAB — GLUCOSE, POCT (MANUAL RESULT ENTRY): POC Glucose: 85 mg/dl (ref 70–99)

## 2015-01-20 LAB — TSH: TSH: 2.56 u[IU]/mL (ref 0.35–4.50)

## 2015-01-20 LAB — VITAMIN B12: Vitamin B-12: 558 pg/mL (ref 211–911)

## 2015-01-20 NOTE — Progress Notes (Signed)
Subjective:     HPI   C/o blurred vision 3 weeks ago  - eye dr said eye exam was ok. It may last all day. He had weelkly episodes. Last episode - 3 wks ago. He quit smoking. C/o L HA daily x 6 months.  C/o fatigue, wt gain. He got married. Planning to live in [place] and winters in [place]  The patient presents for a follow-up of  chronic hypertension, chronic dyslipidemia, PVD, CAD controlled with medicines. He started Tricor. He doesn't take  Lipitor. F/u ED, PVD. F/u bruising - resolved. F/u B IT pain. C/o fatigue from Metoprolol or other meds. C/o HAs  Wt Readings from Last 3 Encounters:  01/20/15 277 lb (125.646 kg)  09/08/14 267 lb (121.11 kg)  08/28/14 266 lb (120.657 kg)   BP Readings from Last 3 Encounters:  01/20/15 138/70  09/08/14 134/57  09/08/14 140/78     Review of Systems  Constitutional: Negative for appetite change, fatigue and unexpected weight change.  HENT: Negative for congestion, nosebleeds, sneezing, sore throat and trouble swallowing.   Eyes: Negative for itching and visual disturbance.  Respiratory: Positive for cough.   Cardiovascular: Negative for palpitations and leg swelling.  Gastrointestinal: Negative for nausea, diarrhea, blood in stool and abdominal distention.  Genitourinary: Negative for frequency and hematuria.  Musculoskeletal: Negative for back pain, joint swelling, gait problem and neck pain.  Skin: Negative for rash.  Neurological: Negative for dizziness, tremors, speech difficulty and weakness.  Psychiatric/Behavioral: Negative for suicidal ideas, sleep disturbance, dysphoric mood and agitation. The patient is not nervous/anxious.        Objective:   Physical Exam  Constitutional: He is oriented to person, place, and time. He appears well-developed.  Obese  HENT:  Mouth/Throat: Oropharynx is clear and moist.  Eyes: Conjunctivae are normal. Pupils are equal, round, and reactive to light.  Neck: Normal range of motion. No JVD  present. No thyromegaly present.  Cardiovascular: Normal rate, regular rhythm, normal heart sounds and intact distal pulses.  Exam reveals no gallop and no friction rub.   No murmur heard. Pulmonary/Chest: Effort normal and breath sounds normal. No respiratory distress. He has no wheezes. He has no rales. He exhibits no tenderness.  Abdominal: Soft. Bowel sounds are normal. He exhibits no distension and no mass. There is no tenderness. There is no rebound and no guarding.  Musculoskeletal: Normal range of motion. He exhibits no edema or tenderness.  R lat foot oval ellastic mass 2x1.5 cm  Lymphadenopathy:    He has no cervical adenopathy.  Neurological: He is alert and oriented to person, place, and time. He has normal reflexes. No cranial nerve deficit. He exhibits normal muscle tone. Coordination normal.  Skin: Skin is warm and dry. No rash noted.  Psychiatric: He has a normal mood and affect. His behavior is normal. Judgment and thought content normal.  Lipomas - chest eryth throat  Lab Results  Component Value Date   WBC 12.0* 09/08/2014   HGB 16.4 09/08/2014   HCT 49.2 09/08/2014   PLT 215.0 09/08/2014   GLUCOSE 88 09/08/2014   CHOL 156 12/20/2013   TRIG 354* 12/20/2013   HDL 17* 12/20/2013   LDLDIRECT 111.7 08/07/2013   LDLCALC 68 12/20/2013   ALT 21 09/08/2014   AST 22 09/08/2014   NA 139 09/08/2014   K 3.9 09/08/2014   CL 105 09/08/2014   CREATININE 1.1 09/08/2014   BUN 16 09/08/2014   CO2 26 09/08/2014   TSH 2.15  09/08/2014   PSA 0.48 11/03/2010   INR 0.97 12/20/2013   HGBA1C 5.5 12/20/2013        Assessment & Plan:

## 2015-01-20 NOTE — Progress Notes (Signed)
Pre visit review using our clinic review tool, if applicable. No additional management support is needed unless otherwise documented below in the visit note. 

## 2015-01-20 NOTE — Assessment & Plan Note (Signed)
Labs

## 2015-01-20 NOTE — Assessment & Plan Note (Addendum)
  3/16 episodic, recurrent -- ?etiology Nl eye exam in 3.16 by Ophthalmology   ESR, CBC, B12, A1c Neurol ref ?brain MRI

## 2015-01-22 ENCOUNTER — Ambulatory Visit (INDEPENDENT_AMBULATORY_CARE_PROVIDER_SITE_OTHER): Payer: 59 | Admitting: Neurology

## 2015-01-22 ENCOUNTER — Encounter: Payer: Self-pay | Admitting: Neurology

## 2015-01-22 VITALS — BP 116/69 | HR 69 | Ht 70.0 in | Wt 278.0 lb

## 2015-01-22 DIAGNOSIS — Z8669 Personal history of other diseases of the nervous system and sense organs: Secondary | ICD-10-CM

## 2015-01-22 DIAGNOSIS — G43109 Migraine with aura, not intractable, without status migrainosus: Secondary | ICD-10-CM

## 2015-01-22 DIAGNOSIS — R29810 Facial weakness: Secondary | ICD-10-CM | POA: Diagnosis not present

## 2015-01-22 DIAGNOSIS — H539 Unspecified visual disturbance: Secondary | ICD-10-CM | POA: Diagnosis not present

## 2015-01-22 LAB — CREATININE, SERUM: CREATININE: 0.92 mg/dL (ref 0.50–1.35)

## 2015-01-22 LAB — BUN: BUN: 17 mg/dL (ref 6–23)

## 2015-01-22 MED ORDER — TOPIRAMATE 25 MG PO TABS: ORAL_TABLET | ORAL | Status: AC

## 2015-01-22 NOTE — Patient Instructions (Signed)
We will obtain MRI brain and call you with the results Start taking topamax 25mg  daily for one month, if no improvement and you are tolerating the medication, increase to 50mg  daily. Return to clinic in 6-8 weeks

## 2015-01-22 NOTE — Progress Notes (Signed)
[facility] Clinic Note - Initial Visit   Date: 01/22/2015   [name] MRN: [id number] DOB: 1950/12/04   Dear Dr. Alain Marion:  Thank you for your kind referral of [name] for consultation of blurry vision. Although his history is well known to you, please allow [place] to reiterate it for the purpose of our medical record. The patient was accompanied to the clinic by wife who also provides collateral information.     History of Present Illness: [name] is a 64 y.o. right-handed Caucasian male with right Bell's palsy (subtle residual right ptosis), CAD s/p stent, hyperlipidemia, COPD, peripheral vascular disease s/p iliofemoral bypass, GERD, hypertension and tobacco use (trying to quit) presenting for evaluation of blurry vision.    Starting around fall of 2015, he noticed having intermittent "fuzzy" vision which is present with near or distance vision. There is no double vision, he describes it as "blurr".  He has problems reading the guide on the TV and noticed difficulty recognizing faces down the hallway.  He has tried closing each eye during these spells, which does not improve. His left eye has always been better.  Spells occur once per week, lasting a day. During these episodes, he has even tried using a magnifying glass to read is ipad or phone.  No exacerbating or alleviating factors.  Denies eye pain, facial numbness, weakness, problems with color vision, or taste.  No limb weakness.    For several years, he has noticed a daily left temporal throbbing headache, pain is ranked 6/10.  Ibuprofen helps which he takes almost daily.  No alleviating factors.    He had eye evaluation on 12/29/2014 by Dr. Arnoldo Morale, opthalmologist, whose note indicated known left cataract (no treatment), vitrous floaters, and corneal guttata.  Recommendation was made for him to use reading glasses.  Vision acuity was stable. Patient reports that he did not have  episode during this visit.  Of note, he had cataract surgery in 2014 and does not notice any improvement since having it.     Past Medical History  Diagnosis Date  . Hiatal hernia   . Duodenitis   . Renal cyst   . Bell's palsy   . Hypercholesteremia   . Diverticulosis   . Cyst     sebacceous  . Renal cell carcinoma   . Low back pain   . Hypertension   . Vitamin B12 deficiency   . PVD (peripheral vascular disease)     s/p right iliofem. bypass  . Pancreatitis   . GERD (gastroesophageal reflux disease)   . COPD (chronic obstructive pulmonary disease)   . Colon polyps   . Coronary artery disease     a. s/p INF STEMI 4/12: DES x 2 (mid and dist RCA);  b. cath 4/12: oLAD 40%, mLAD 40%, pD1 30%, pOM1 40%, pOM3 50%, mRCA 80% (PCI), PDA 70% (med tx), PLB1 60%  (med tx); EF 55%  . Meralgia paresthetica     right  . Erectile dysfunction   . OSA (obstructive sleep apnea)     PSG 03/02/11>>AHI 28.6, SpO2 low 88%, CPAP 10 cm H2O.  . 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 Myocardial infarction   . Shortness of breath     Past Surgical History  Procedure Laterality Date  . Bypass graft      Rt. iliofemoral   . Inguinal hernia repair      Rt.  Marland Kitchen  Tumor removal      Cryo Rena  . Colonoscopy    . Upper gastrointestinal endoscopy    . Video assisted thoracoscopy  July 2013    Rt upper and Rt lower lobe wedge resections  . Coronary angioplasty  01/2011  . Mass excision Right 09/19/2013    Procedure: Excision back mass x2;  Surgeon: Stark Klein, MD;  Location: [place];  Service: General;  Laterality: Right;  . Excision of skin tag N/A 09/19/2013    Procedure: EXCISION OF SKIN TAG;  Surgeon: Stark Klein, MD;  Location: [place];  Service: General;  Laterality: N/A;  . Femoral bypass Right ?   2005     Medications:  Current Outpatient Prescriptions on File Prior to Visit  Medication Sig Dispense Refill  . aspirin 81 MG tablet Take 81 mg by  mouth every morning.     . Cholecalciferol (VITAMIN D3) 1000 UNITS CAPS Take 1,000 Units by mouth daily.     . clopidogrel (PLAVIX) 75 MG tablet Take 75 mg by mouth every morning.    . Coenzyme Q10 (CO Q-10) 200 MG CAPS Take 200 mg by mouth daily.     Marland Kitchen ezetimibe (ZETIA) 10 MG tablet Take 1 tablet (10 mg total) by mouth daily. 90 tablet 3  . ibuprofen (ADVIL,MOTRIN) 200 MG tablet Take 400 mg by mouth every 4 (four) hours as needed.    . pantoprazole (PROTONIX) 40 MG tablet Take 1 tablet (40 mg total) by mouth daily. 90 tablet 3  . sucralfate (CARAFATE) 1 G tablet Take 1 tablet (1 g total) by mouth 4 (four) times daily -  with meals and at bedtime. (Patient taking differently: Take 1 g by mouth at bedtime. ) 90 tablet 11  . telmisartan (MICARDIS) 80 MG tablet Take 1 tablet (80 mg total) by mouth daily. 90 tablet 3  . tiotropium (SPIRIVA) 18 MCG inhalation capsule Place 18 mcg into inhaler and inhale daily.    . traMADol (ULTRAM) 50 MG tablet Take 50-100 mg by mouth every 6 (six) hours as needed for moderate pain (when golfing).    . triamcinolone (NASACORT AQ) 55 MCG/ACT AERO nasal inhaler Place 2 sprays into the nose daily. 1 Inhaler 12  . triamterene-hydrochlorothiazide (MAXZIDE-25) 37.5-25 MG per tablet Take 0.5 tablets by mouth daily. 90 tablet 3  . vardenafil (LEVITRA) 20 MG tablet Take 1 tablet (20 mg total) by mouth daily as needed for erectile dysfunction. 20 tablet 6  . vitamin B-12 (CYANOCOBALAMIN) 1000 MCG tablet Take 1,000 mcg by mouth daily.     . vitamin C (ASCORBIC ACID) 500 MG tablet Take 1,000 mg by mouth daily.      No current facility-administered medications on file prior to visit.    Allergies:  Allergies  Allergen Reactions  . Pravastatin Sodium Other (See Comments)    REACTION: gasy, constipated  . Rosuvastatin Other (See Comments)    REACTION: myalgias  . Simvastatin Other (See Comments)    REACTION: myalgia  . Statins Other (See Comments)    Constipation and  myalgias    Family History: Family History  Problem Relation Age of Onset  . Colon cancer Paternal Grandmother   . Cancer Paternal Grandmother     colon  . Hypertension    . Stroke Father   . Hypertension Mother     Social History: History   Social History  . Marital Status: Married    Spouse Name: N/A  . Number of Children: N/A  .  Years of Education: N/A   Occupational History  . business Freight forwarder     [employer]   Social History Main Topics  . Smoking status: Current Every Day Smoker -- 0.75 packs/day for 40 years    Types: Cigarettes  . Smokeless tobacco: Never Used  . Alcohol Use: Yes     Comment: occasionally couple of drinks a month  . Drug Use: No  . Sexual Activity: Yes   Other Topics Concern  . Not on file   Social History Narrative    Review of Systems:  CONSTITUTIONAL: No fevers, chills, night sweats, or weight loss.   EYES: +visual changes or eye pain ENT: No hearing changes.  No history of nose bleeds.   RESPIRATORY: No cough, wheezing and shortness of breath.   CARDIOVASCULAR: Negative for chest pain, and palpitations.   GI: Negative for abdominal discomfort, blood in stools or black stools.  No recent change in bowel habits.   GU:  No history of incontinence.   MUSCLOSKELETAL: No history of joint pain or swelling.  No myalgias.   SKIN: Negative for lesions, rash, and itching.   HEMATOLOGY/ONCOLOGY: Negative for prolonged bleeding, bruising easily, and swollen nodes.  No history of cancer.   ENDOCRINE: Negative for cold or heat intolerance, polydipsia or goiter.   PSYCH:  No depression or anxiety symptoms.   NEURO: As Above.   Vital Signs:  BP 116/69 mmHg  Pulse 69  Ht $R'5\' 10"'SN$  (1.778 m)  Wt 278 lb (126.1 kg)  BMI 39.89 kg/m2  SpO2 94%   General Medical Exam:   General:  Well appearing, comfortable.   Eyes/ENT: see cranial nerve examination.  No temporal tenderness. Neck: No masses appreciated.  Full range of motion without tenderness.  No  carotid bruits. Respiratory:  Clear to auscultation, good air entry bilaterally.   Cardiac:  Regular rate and rhythm, no murmur.   Extremities:  No deformities, edema, or skin discoloration.  Skin:  No rashes or lesions.  Neurological Exam: MENTAL STATUS including orientation to time, place, person, recent and remote memory, attention span and concentration, language, and fund of knowledge is normal.  Speech is not dysarthric.  CRANIAL NERVES: II:  No visual field defects.  Unremarkable fundi.  Visual acuity is 20/50 OD, 20/25 OS.  No color desaturation. III-IV-VI: Pupils equal round and reactive to light.  Normal conjugate, extra-ocular eye movements in all directions of gaze.  No nystagmus.  Subtle right ptosis with no worsening with sustained upward gaze.   V:  Normal facial sensation.     VII:  Right facial asymmetry with mild droop.  Forehead creases are symmetric.  Buccinator is 5-/5, remaining facial muscles are intact bilaterally.  No pathologic facial reflexes.  VIII:  Normal hearing and vestibular function.   IX-X:  Normal palatal movement.   XI:  Normal shoulder shrug and head rotation.   XII:  Normal tongue strength and range of motion, no deviation or fasciculation.  MOTOR:  No atrophy, fasciculations or abnormal movements.  No pronator drift.  Tone is normal.    Right Upper Extremity:    Left Upper Extremity:    Deltoid  5/5   Deltoid  5/5   Biceps  5/5   Biceps  5/5   Triceps  5/5   Triceps  5/5   Wrist extensors  5/5   Wrist extensors  5/5   Wrist flexors  5/5   Wrist flexors  5/5   Finger extensors  5/5   Finger  extensors  5/5   Finger flexors  5/5   Finger flexors  5/5   Dorsal interossei  5/5   Dorsal interossei  5/5   Abductor pollicis  5/5   Abductor pollicis  5/5   Tone (Ashworth scale)  0  Tone (Ashworth scale)  0   Right Lower Extremity:    Left Lower Extremity:    Hip flexors  5/5   Hip flexors  5/5   Hip extensors  5/5   Hip extensors  5/5   Knee flexors   5/5   Knee flexors  5/5   Knee extensors  5/5   Knee extensors  5/5   Dorsiflexors  5/5   Dorsiflexors  5/5   Plantarflexors  5/5   Plantarflexors  5/5   Toe extensors  5/5   Toe extensors  5/5   Toe flexors  5/5   Toe flexors  5/5   Tone (Ashworth scale)  0  Tone (Ashworth scale)  0   MSRs:  Right                                                                 Left brachioradialis 2+  brachioradialis 2+  biceps 2+  biceps 2+  triceps 2+  triceps 2+  patellar 2+  patellar 2+  ankle jerk 1+  ankle jerk 1+  Hoffman no  Hoffman no  plantar response down  plantar response down   SENSORY:  Normal and symmetric perception of light touch, pinprick, vibration, and proprioception.  Romberg's sign absent.   COORDINATION/GAIT: Normal finger-to- nose-finger and heel-to-shin.  Intact rapid alternating movements bilaterally.  Able to rise from a chair without using arms.  Gait narrow based and stable.   Out-side paper records, electronic medical record, and images have been reviewed where available and summarized as:  Labs 01/20/2015:  vitamin B12 558, TSH 2.56, ESR 11, HbA1c 5.9  US carotids 02/17/2014:  technically challenging study, 1-39% stenosis bilaterally  CT brain 12/19/2013: Stable and normal noncontrast CT appearance of the brain.  MRI lumbar spine 03/22/2005: Aneurysmal dilatation of the abdominal aorta. DDD changes mainly at L4-5.  Foraminal stenosis at L4-5, left greater than right. Mild central canal stenosis at L4-5.  MRI of the thoracic spine with and without IV contrast is to follow.  MRI thoracic spine 03/22/2005: At this point, I cannot exclude a possible vascular malformation of the thoracic cord.  There are no secondary findings such as cord infarction or edema.  If clinically desired, further workup of this possible vascular malformation could be performed with smaller field of view, high resolution MR imaging in three planes pre and post IV Omniscan.  Also diffusion-weighted  imaging may be attempted to assess for ischemia.  IMPRESSION: [name] is a 64 year-old gentleman presenting for evaluation of vision changes.  His exam shows changes in vision acuity and right facial weakness (history of Bell's palsy), without evidence of opthalmoplegia or other cranial nerve deficits.  There is no fatigable weakness on exam.  Myasthenia gravis would be very atypical to cause blurry vision, as this generally manifests with diplopia and ptosis, but for completeness, I will check acetylcholine receptor antibodies.  With his history of side-locked headaches, migraine equivalent syndrome is another possibility.  We discussed  starting daily preventative medication especially since he is taking ibuprofen daily, which I stressed that he needs to limit to twice per week. I also do not feel strongly that he has giant cell arteritis with ESR being 11 and lack of temporal tenderness.   MRI brain will be ordered to look for intracranial pathology, but my suspicion on finding something worrisome is low.   PLAN/RECOMMENDATIONS:  1.  MRI brain wwo contrast 2.  Start topamax $RemoveBefor'25mg'[id number]$  daily for one month, if no improvement increase to $RemoveBef'50mg'[id number]$  daily 3.  Check MG panel 4.  Return to clinic in 6-8 weeks  The duration of this appointment visit was 50 minutes of face-to-face time with the patient.  Greater than 50% of this time was spent in counseling, explanation of diagnosis, planning of further management, and coordination of care.   Thank you for allowing me to participate in patient's care.  If I can answer any additional questions, I would be pleased to do so.    Sincerely,    Safari Cinque K. Posey Pronto, DO

## 2015-01-27 ENCOUNTER — Ambulatory Visit (HOSPITAL_COMMUNITY)
Admission: RE | Admit: 2015-01-27 | Discharge: 2015-01-27 | Disposition: A | Discharge status: Home / Self Care | Payer: 59 | Source: Ambulatory Visit | Attending: Neurology | Admitting: Neurology

## 2015-01-27 DIAGNOSIS — H538 Other visual disturbances: Secondary | ICD-10-CM | POA: Insufficient documentation

## 2015-01-27 DIAGNOSIS — H539 Unspecified visual disturbance: Secondary | ICD-10-CM

## 2015-01-27 DIAGNOSIS — R29810 Facial weakness: Secondary | ICD-10-CM

## 2015-01-27 DIAGNOSIS — G43109 Migraine with aura, not intractable, without status migrainosus: Secondary | ICD-10-CM

## 2015-01-27 DIAGNOSIS — R51 Headache: Secondary | ICD-10-CM | POA: Diagnosis present

## 2015-01-27 MED ORDER — GADOBENATE DIMEGLUMINE 529 MG/ML IV SOLN
20.0000 mL | Freq: Once | INTRAVENOUS | Status: AC | PRN
  Administered 2015-01-27: 20 mL via INTRAVENOUS

## 2015-01-29 ENCOUNTER — Ambulatory Visit (HOSPITAL_COMMUNITY): Payer: 59

## 2015-01-29 LAB — MYASTHENIA GRAVIS PANEL 2
Acetylcholine Rec Mod Ab: 7 %
Aceytlcholine Rec Bloc Ab: <15 % of inhibition (ref <15)

## 2015-02-17 ENCOUNTER — Ambulatory Visit: Payer: 59 | Admitting: Internal Medicine

## 2015-02-20 IMAGING — CT CT CHEST W/O CM
2 of 4 series · 15 of 36 positions shown, 18 images · IV contrast (Omnipaque 300)
Comparison: 12/20/2013, 08/14/2012

CLINICAL DATA: Followup pulmonary nodules, history of emphysema.
Specifically, followup was recommended for a 7 mm left upper lobe
nodule enlarged at prior exam 12/20/2013.

EXAM:
CT CHEST WITHOUT CONTRAST
TECHNIQUE: Multidetector CT imaging of the chest was performed following the
standard protocol without IV contrast..

[Series 2: chest routine with · axial · 0.94mm/px · z∈[-257,-12]mm · 12 of 59 slices shown, 15 images]
[im 5/59  mediastinal]
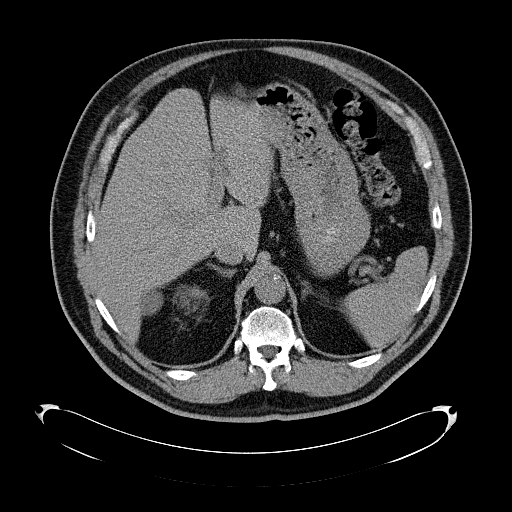
[im 5/59  lung]
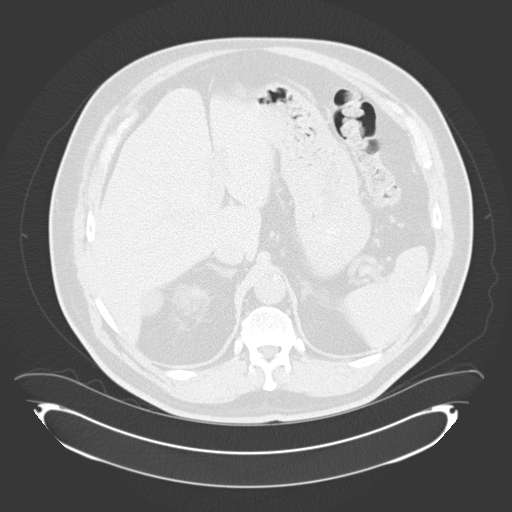
[im 9/59  lung]
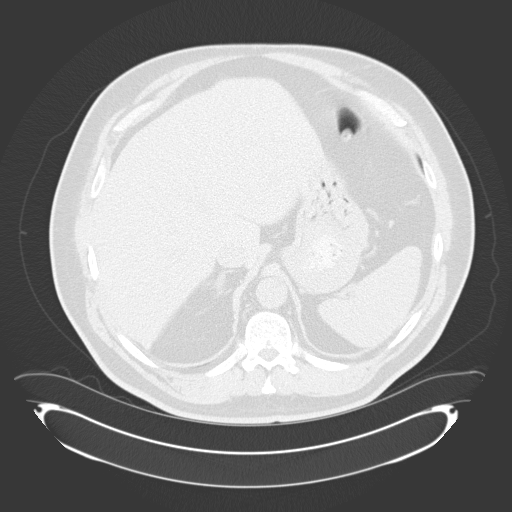
[im 14/59  lung]
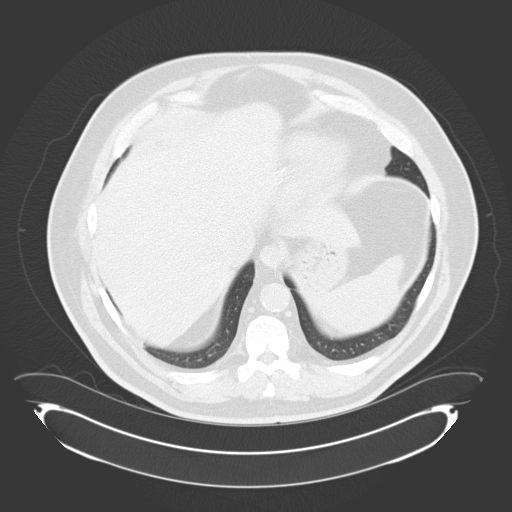
[im 18/59  lung]
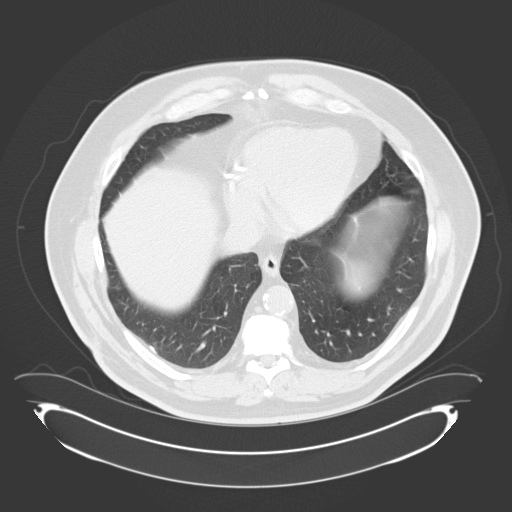
[im 23/59  mediastinal]
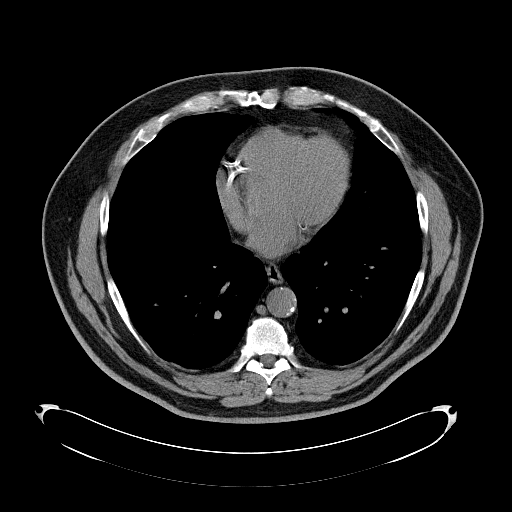
[im 23/59  lung]
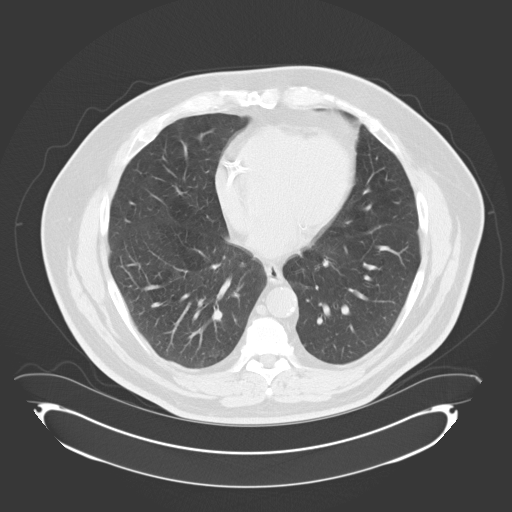
[im 27/59  lung]
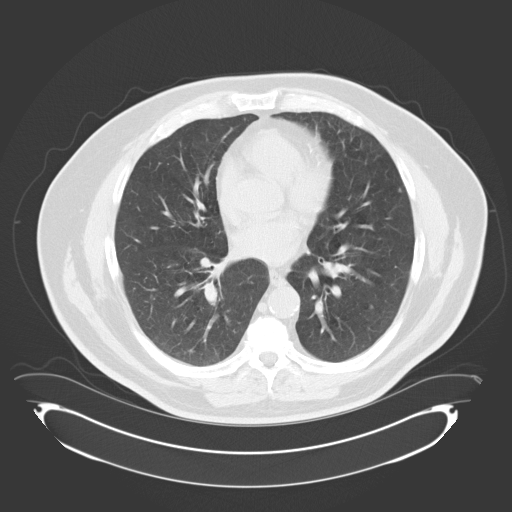
[im 32/59  lung]
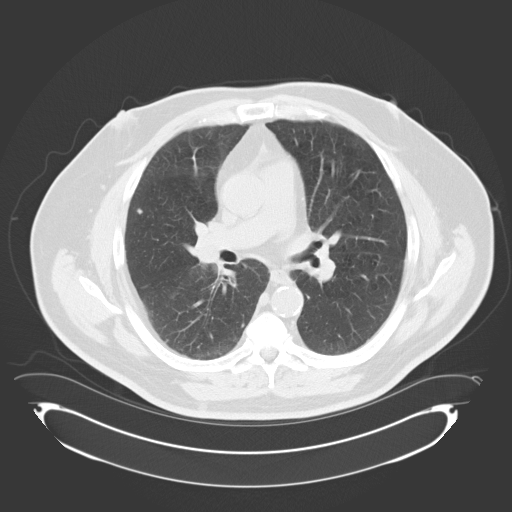
[im 36/59  lung]
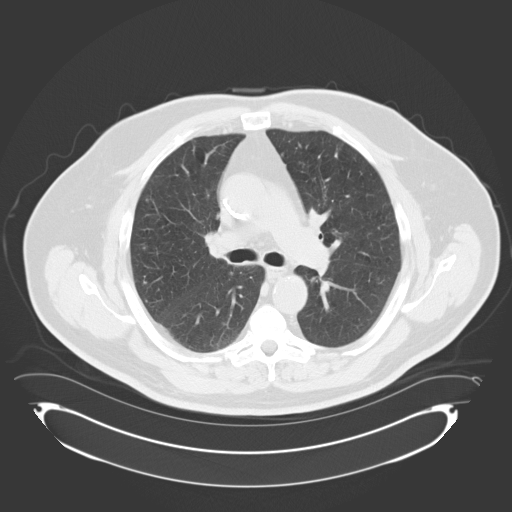
[im 41/59  mediastinal]
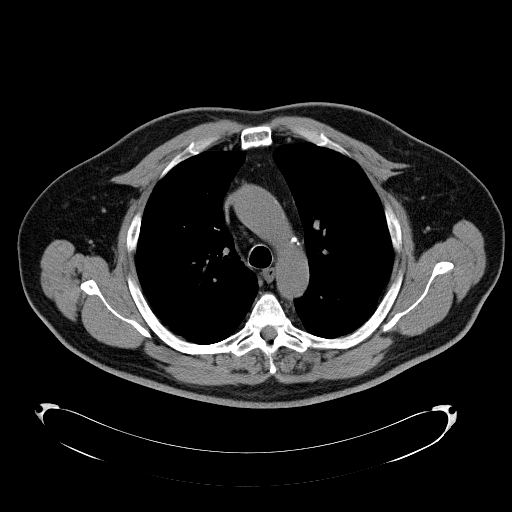
[im 41/59  lung]
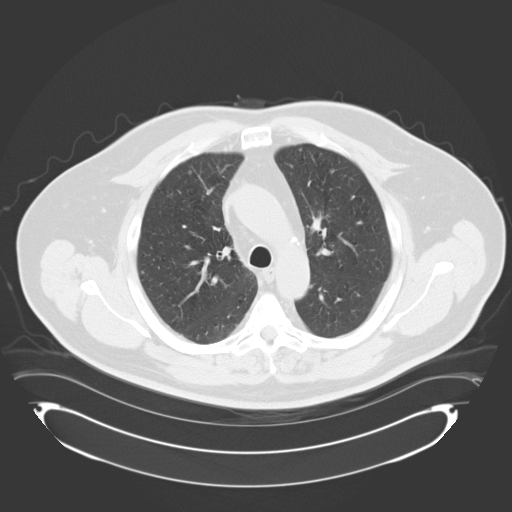
[im 45/59  lung]
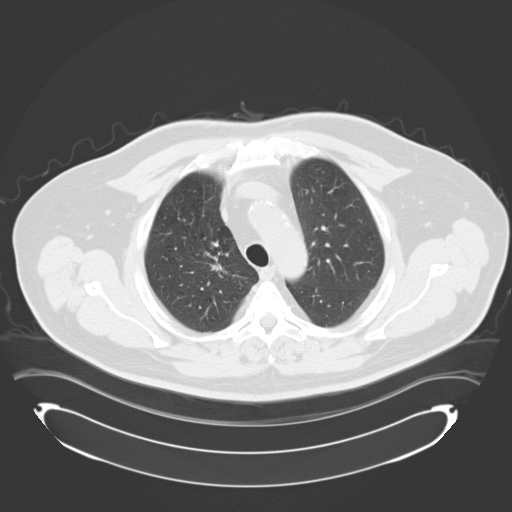
[im 50/59  lung]
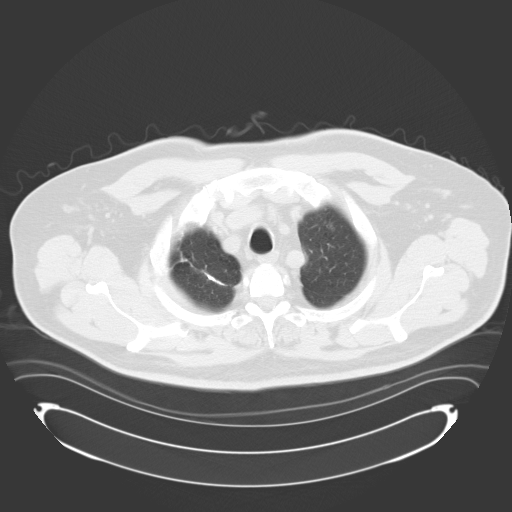
[im 54/59  lung]
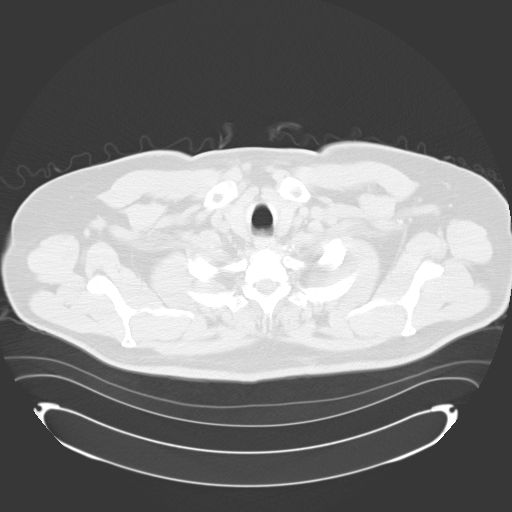

[Series 602: cor · coronal · 0.94mm/px · 3 of 145 slices shown]
[im 29/145  lung]
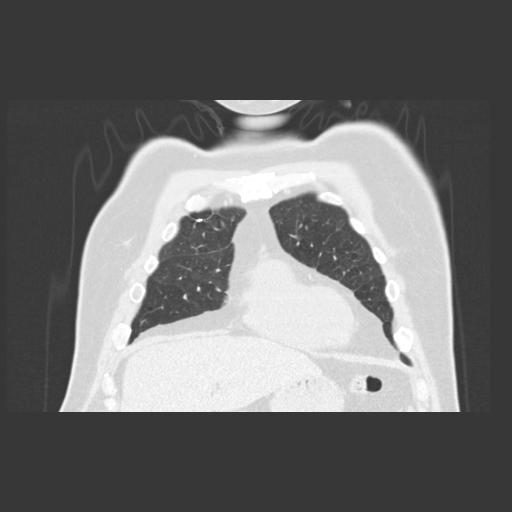
[im 58/145  lung]
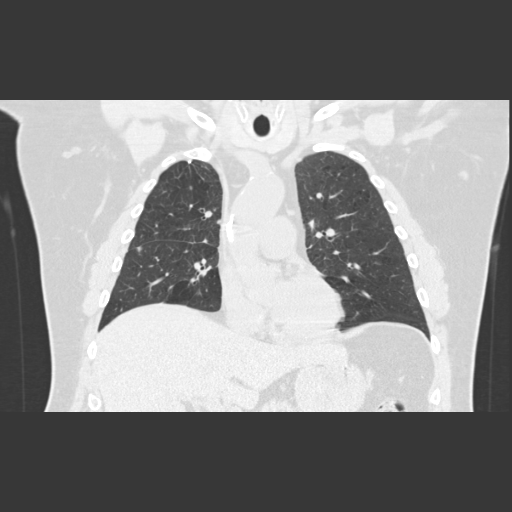
[im 87/145  lung]
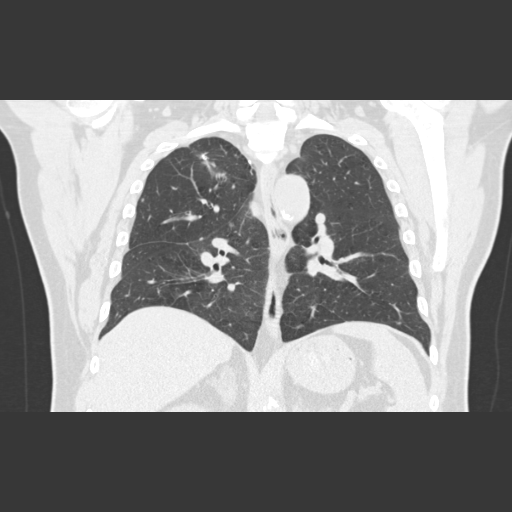

[15 of 36 positions shown; findings below may reference images not displayed]

FINDINGS: The previously seen left upper lobe pleural parenchymal nodule is
now smaller, measuring 5 mm and subjectively also appearing less
prominent. Multiple bilateral diffuse 1-2 mm pulmonary nodules
elsewhere are stable, however there are multiple new pulmonary
nodules also identified, including 4 mm right middle lobe pulmonary
nodule image 28 and 6 mm right lower lobe pulmonary nodule image 27.
Calcified right pleural plaque reidentified without adjacent mass
lesion. Central airways are patent. Right upper lobe scarring is
reidentified.

Great vessels are normal in caliber. Moderate atheromatous aortic
calcification and coronary arterial calcification noted without
aneurysm. No lymphadenopathy. No pericardial or pleural effusion.

Incomplete imaging of the upper abdomen re- demonstrates right renal
cortical probable cyst. No acute osseous abnormality.
IMPRESSION: Multiple bilateral diffuse pulmonary nodules are reidentified. Many
of these are unchanged, and the previously new left upper lobe
pulmonary nodule has decreased in size. However, there are other
nodules in the right lung that are new since before. Per review of
the medical record, there is a history of Langerhans histiocytosis,
which may present with pulmonary nodules. However, because the
patient also has a history of smoking and is at increased risk for
lung cancer, followup chest CT is recommended in 6 months.

## 2015-03-10 ENCOUNTER — Other Ambulatory Visit: Payer: Self-pay | Admitting: Internal Medicine

## 2015-03-11 NOTE — Telephone Encounter (Signed)
Please advise in PCPs absence.  

## 2015-03-21 ENCOUNTER — Other Ambulatory Visit: Payer: Self-pay | Admitting: Internal Medicine

## 2015-03-25 ENCOUNTER — Other Ambulatory Visit: Payer: Self-pay | Admitting: *Deleted

## 2015-03-25 MED ORDER — SUCRALFATE 1 G PO TABS: ORAL_TABLET | ORAL | Status: AC

## 2015-03-25 NOTE — Telephone Encounter (Signed)
Received fax pt is requesting 90 day supply on his Sucralfate 1 gm,....[name]

## 2015-04-14 ENCOUNTER — Other Ambulatory Visit: Payer: Self-pay | Admitting: *Deleted

## 2015-04-14 NOTE — Telephone Encounter (Signed)
Received phone call from [facility] in [place]. Patient has relocated to there and needs refills on clopidogrel and pantoprazole. Patient is overdue. Please advise. Thanks, [place]

## 2015-04-15 NOTE — Telephone Encounter (Signed)
OK to refill

## 2015-04-17 ENCOUNTER — Other Ambulatory Visit: Payer: Self-pay

## 2015-04-17 ENCOUNTER — Telehealth: Payer: Self-pay | Admitting: Internal Medicine

## 2015-04-17 MED ORDER — PANTOPRAZOLE SODIUM 40 MG PO TBEC: 40.0000 mg | DELAYED_RELEASE_TABLET | Freq: Every day | ORAL | Status: AC

## 2015-04-17 MED ORDER — CLOPIDOGREL BISULFATE 75 MG PO TABS: 75.0000 mg | ORAL_TABLET | Freq: Every morning | ORAL | Status: AC

## 2015-04-17 NOTE — Telephone Encounter (Signed)
Per Dr Harrington Challenger note on 6.14.16

## 2015-04-17 NOTE — Telephone Encounter (Signed)
error 

## 2015-05-25 ENCOUNTER — Telehealth: Payer: Self-pay | Admitting: Internal Medicine

## 2015-05-25 NOTE — Telephone Encounter (Signed)
**Note De-Identified Wilena Tyndall Obfuscation** I did receive conformation that this fax was sent successfully.

## 2015-05-25 NOTE — Telephone Encounter (Signed)
**Note De-Identified Connie Hilgert Obfuscation** Dr Harrington Challenger has not seen this patient since 06/02/14. Please ask the pt to be sure he has not had any cardiac events or procedures done since that time. As of his last office visit with Dr Harrington Challenger he was ok to have dental procedure. Per Dr Harrington Challenger the patient should stop taking Plavix 5 days prior to dental procedure and resume taking the evening of dental procedure if he has had no cardiac event since 06/02/14.

## 2015-05-25 NOTE — Telephone Encounter (Signed)
1. What dental office are you calling from? [facility]  2. What is your office phone and fax number? Fax: [phone]  3. What type of procedure is the patient having performed? Exam for possible root canal  4. What date is procedure scheduled? 05/27/15  5. What is your question (ex. Antibiotics prior to procedure, holding medication-we need to know how long dentist wants pt to hold med)? Wanting to find out what pt needs to do prior to procedure. Please call back and advise.
